# Patient Record
Sex: Female | Born: 1985 | Race: Black or African American | Hispanic: No | Marital: Single | Smoking: Never smoker
Health system: Southern US, Community
[De-identification: ages and names within clinical notes are randomized; demographics above are authoritative.]

## PROBLEM LIST (undated history)

## (undated) ENCOUNTER — Inpatient Hospital Stay (HOSPITAL_COMMUNITY): Payer: Self-pay

## (undated) DIAGNOSIS — R569 Unspecified convulsions: Secondary | ICD-10-CM

## (undated) HISTORY — PX: OTHER SURGICAL HISTORY: SHX169

## (undated) HISTORY — PX: NEPHRECTOMY: SHX65

---
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
## 2007-01-06 ENCOUNTER — Inpatient Hospital Stay (HOSPITAL_COMMUNITY): Admission: AD | Admit: 2007-01-06 | Discharge: 2007-01-07 | Payer: Self-pay | Admitting: Obstetrics & Gynecology

## 2007-04-30 ENCOUNTER — Inpatient Hospital Stay (HOSPITAL_COMMUNITY): Admission: AD | Admit: 2007-04-30 | Discharge: 2007-05-03 | Payer: Self-pay | Admitting: Pediatrics

## 2007-11-12 ENCOUNTER — Emergency Department (HOSPITAL_COMMUNITY): Admission: EM | Admit: 2007-11-12 | Discharge: 2007-11-12 | Payer: Self-pay | Admitting: Emergency Medicine

## 2009-03-27 ENCOUNTER — Emergency Department (HOSPITAL_COMMUNITY): Admission: EM | Admit: 2009-03-27 | Discharge: 2009-03-27 | Payer: Self-pay | Admitting: Emergency Medicine

## 2010-04-29 ENCOUNTER — Emergency Department (HOSPITAL_COMMUNITY): Admission: EM | Admit: 2010-04-29 | Discharge: 2010-04-29 | Payer: Self-pay | Admitting: Emergency Medicine

## 2010-07-26 ENCOUNTER — Emergency Department (HOSPITAL_COMMUNITY): Admission: EM | Admit: 2010-07-26 | Discharge: 2010-07-26 | Payer: Self-pay | Admitting: Emergency Medicine

## 2010-08-06 ENCOUNTER — Emergency Department (HOSPITAL_COMMUNITY)
Admission: EM | Admit: 2010-08-06 | Discharge: 2010-08-06 | Payer: Self-pay | Source: Home / Self Care | Admitting: Emergency Medicine

## 2010-08-12 ENCOUNTER — Emergency Department (HOSPITAL_COMMUNITY): Admission: EM | Admit: 2010-08-12 | Discharge: 2010-04-24 | Payer: Self-pay | Admitting: Emergency Medicine

## 2010-11-16 LAB — RAPID STREP SCREEN (MED CTR MEBANE ONLY): Streptococcus, Group A Screen (Direct): NEGATIVE

## 2010-11-18 LAB — URINALYSIS, ROUTINE W REFLEX MICROSCOPIC
Bilirubin Urine: NEGATIVE
Glucose, UA: NEGATIVE mg/dL
Ketones, ur: NEGATIVE mg/dL
Nitrite: NEGATIVE
Protein, ur: NEGATIVE mg/dL
Specific Gravity, Urine: 1.003 — ABNORMAL LOW (ref 1.005–1.030)
Specific Gravity, Urine: 1.009 (ref 1.005–1.030)
Urobilinogen, UA: 1 mg/dL (ref 0.0–1.0)
pH: 6 (ref 5.0–8.0)

## 2010-11-18 LAB — POCT I-STAT, CHEM 8
BUN: 3 mg/dL — ABNORMAL LOW (ref 6–23)
Calcium, Ion: 1.16 mmol/L (ref 1.12–1.32)
Chloride: 104 mEq/L (ref 96–112)
Creatinine, Ser: 0.8 mg/dL (ref 0.4–1.2)
Glucose, Bld: 110 mg/dL — ABNORMAL HIGH (ref 70–99)
HCT: 34 % — ABNORMAL LOW (ref 36.0–46.0)
Hemoglobin: 12.2 g/dL (ref 12.0–15.0)
Potassium: 2.9 mEq/L — ABNORMAL LOW (ref 3.5–5.1)
Sodium: 138 mEq/L (ref 135–145)
TCO2: 22 mmol/L (ref 0–100)
TCO2: 27 mmol/L (ref 0–100)

## 2010-11-18 LAB — URINE MICROSCOPIC-ADD ON

## 2010-11-18 LAB — URINE CULTURE: Culture  Setup Time: 201108201126

## 2010-11-18 LAB — POCT PREGNANCY, URINE: Preg Test, Ur: NEGATIVE

## 2011-01-18 NOTE — H&P (Signed)
NAME:  Olivia Kelly, Olivia Kelly NO.:  [id number]   MEDICAL RECORD NO.:  [id number]           PATIENT TYPE:   LOCATION:                               FACILITY:  [facility]   PHYSICIAN:  Deanna Artis. Hickling, M.D.DATE OF BIRTH:  Aug 08, 1986   DATE OF ADMISSION:  04/29/2007  DATE OF DISCHARGE:                              HISTORY & PHYSICAL   CHIEF COMPLAINT:  Intractable seizures.   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  The patient was admitted  for prolonged video telemetered EEG to further characterize the EEG and  the clinical semiology of her seizures.   PAST MEDICAL HISTORY:  The patient had onset of seizures at about 25  years of age.  She was diagnosed childhood absence  epilepsy and treated  with Zarontin with good success.  We were able to take her off of  medication.  She had been seizure-free for total of 8 years.  She had  not been seen for nearly 5 years when she returned with a generalized  tonic-clonic seizure disorder. For some reason she had been placed on  Tegretol  at one time and I cannot find a reason to substantiate that  decision.   December 2003 was when she had her first generalized tonic-clonic  seizure, which began with eye blinking that lasted for 2-3 minutes.  She  was seen in January 2004.  The decision was made not to place her  medication.  She did not have recurrent seizures until December 2004,  then another in July 2005 and then finally October 2005.  I saw her in  follow up on June 14, 2004.  The seizure preceding that visit caused  her to fall backwards after she froze up.  She had generalized tonic-  clonic jerking for 2-3 minutes.  She had loss of bowel and bladder  function, and was disoriented.  An EEG was performed.  She was placed on  Lamictal.  She returned in December 2006 with her father.   She did not  know how often she had seizures.  She stated that these happened about  once a month.  They  occurred at dances or at football games; she said  that the strobe lights induced them.  Her Lamictal dose was up to 100  twice daily.  Her drug levels were 4-5 mcg/mL.   The patient was not seen again until September 2007.  At that time she  had seizures in December 2006, and in April, May and August 2007.  During the last two she fell gashing her head and had loss of a urinary  continence.  During the last one the patient was noted to have the  lamotrigine level of 1.5.  She had several days of abdominal discomfort  and vomiting.  A urinary tract infection and positive pregnancy test  were discovered.  The events at that time were said to have lasted for  only 30 seconds with 30 minutes of postictal confusion.  The patient returned on December 07, 2006.  She had a flurry of three  seizures on March 08, 10 and 31, 2008 where she would stare for 2  minutes and have jerking back and forth of her body for 5 minutes  without falling, and she would then collapse and fall asleep.  Her  stupor lasted about 4 minutes and then she was postictal.  This occurred  at the end of her pregnancy.  On April 10, 2007 she returned as she had  generalized tonic-clonic seizures on Feb 01, 2007, June 08, 14 and 19,  2008, and July 17, 28 and 31, 2008; then she was with relatives in  [place].  Her lamotrigine dose had steadily increased and she was  taking 375 mg in the morning and 450 at nighttime.  She was quarrelsome,  irritable and cognitively blunted.  Topamax was started by one of my  partners and I increased it to 25 twice a day and dropped lamotrigine to  375 mg twice a day.   Throughout this time the patient's examination has not changed.  She has  always had some difficulty in school, but her cognitive dysfunction  seems to have worsen.   The patient has had migrainous headaches and most  often these occur  after seizures, but they are independent of them as well.  Her thinking  has been slow.  She has poor memory and slurred speech; this suggests  toxicity.  She has had jerking movements of her eyes.  I do not know if  this represents myoclonus.  She has had nausea and vomiting in  association with her headaches and idiopathic hives.   PAST MEDICAL HISTORY:  The past medical history is otherwise negative.   PAST SURGICAL HISTORY:  The past surgical history is negative.   MEDICATIONS:  Current medications include:  1. Lamictal was 375 mg twice daily.  2. Topamax 25 mg twice daily.  3. She is on some form of a B complex vitamin and also a multivitamin.   ALLERGIES:  Drug allergies; none known.   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  She wants to attend [employer].  She has been sexually active in  the past.  She has a 85-month-old daughter.  She is unemployed and unable  to drive.   FAMILY HISTORY:  The family history is positive for cardiovascular and  cerebrovascular diseases, hypertension, seizures, diabetes mellitus,  migraines, and lung cancer.   PHYSICAL EXAMINATION:  VITAL SIGNS:  On examination today height is 63-  3/4 inches, weight 132 pounds, blood pressure 92/62, resting pulse 84,  respirations 18, she is afebrile, and her oxygen saturation is not  measured.  HEENT AND NECK:  Ears, nose and throat are with no infections; and, neck  is without bruits, or meningismus.  LUNGS:  The lungs are clear.  HEART:  The heart has no murmurs and the pulses are normal.  ABDOMEN:  Soft.  Bowel sounds are normal.  No hepatosplenomegaly.  EXTREMITIES:  The extremities are well-formed without edema or cyanosis.  NEUROLOGIC:  On neurological examination she is awake, alert and  attentive, without dysphasia or dyspraxia.  Cranial nerves show round  and reactive pupils, normal fundi, visual fields are full, and  visual  acuity is 20/70  bilaterally without corrective lenses.  Symmetric facial  strength and sensation. Air conduction is greater than bone conduction  bilaterally.  She is able to protrude her tongue  and elevate her uvula  in the midline.  Motor examination reveals normal strength, tone and  mass.  Good fine motor movements.  No pronator drift.  Sensation is  intact to cold, vibrations, proprioception and stereognosis.  Cerebellar  examination reveals good finger-to-nose and rapid repetitive movements.  Gait is normal.  Normal heels, toes and tandem.  Deep tendon reflexes  are diminished.  The patient has bilateral flexor plantar responses.   IMPRESSION:  1. Intractable generalized tonic-clonic seizures.  345.11.  2. Past medical history of absence seizures.  345.00.   PLAN:  Video telemetered  EEG to discern the semiology and the EEG  characteristics of her seizures.  This will hopefully assess and assist  treatment.  I plan is to continue to push Topamax up for the and  decrease Lamictal.      Deanna Artis. Sharene Skeans, M.D.  Electronically Signed     [facility]/MED[facility]  D:  04/29/2007  T:  04/30/2007  Job:  [id number]

## 2011-01-18 NOTE — Discharge Summary (Signed)
NAME:  Olivia Kelly, Olivia Kelly NO.:  [id number]   MEDICAL RECORD NO.:  [id number]          PATIENT TYPE:  INP   LOCATION:  3023                         FACILITY:  [facility]   PHYSICIAN:  Deanna Artis. Hickling, M.D.DATE OF BIRTH:  1986/07/09   DATE OF ADMISSION:  04/30/2007  DATE OF DISCHARGE:  05/03/2007                               DISCHARGE SUMMARY   CLINICAL HISTORY:  The patient is an 25 year old with juvenile absence  epilepsy.  She has had persistent generalized tonic-clonic seizures that  have been present since 2003.  She had absence seizures since a toddler  and as a child and had been seizure-free for 8 years when her  generalized seizures recurred.  The history is detailed in her history  and physical examination. (345.10)   The patient was admitted for prolonged video telemetry EEG after a  series of outpatient EEGs routine and prolonged ambulatory failed to  show any evidence of seizures.   The patient has been in the hospital now since August 25 and no have  seizures occurred. No abnormalities have been seen on the video EEG.  For that reason, we will discontinue her prolonged EEG this morning and  discharge her from the hospital today.  She will be discharged on  Topamax 25 mg twice daily and lamotrigine at 150 mg 2-1/2twice daily.  The patient will see me in December 2008.  I will see her sooner  depending upon clinical need.  The remainder of the EEG rapport will be  a evaluated by my partner Dr. Imagene Gurney and he may dictate further  addendum based on his observations.  The patient is discharged in  improved condition.  She can return to school May 04, 2007.      Deanna Artis. Sharene Skeans, M.D.  Electronically Signed     [facility]  D:  05/03/2007  T:  05/04/2007  Job:  [id number]

## 2011-01-18 NOTE — Procedures (Signed)
EEG NUMBER:  [id number].   HISTORY:  This is an 25 year old with history of seizures who is having  prolonged video EEG monitoring to evaluate increased frequency of  seizures.   CURRENT MEDICATIONS:  Topamax and Lamictal.   PROCEDURE:  This is a prolonged video EEG monitoring.   TECHNICAL DESCRIPTION:  This prolonged video EEG monitoring occurred  from April 30, 2007, to May 03, 2007.  Day one of recording, there  are no push button events noted.  The background activity is fairly  symmetric, mostly comprised of alpha range activity at 25-40 microvolts.  The second half of the recording, there is intermittent 60 Hz artifact  noted.  There is also a tremendous amount of EMG movement artifact noted  as well.  There is no definitive epileptiform activity noticed on day  one recording.   Day two of recording, there are no push button events witnessed.  The  background activity is fairly symmetric, mostly comprised of alpha range  activity at 20-40 microvolts.  There is no evidence of an electrographic  seizure or interictal discharges noted throughout this recording day.   Day three of recording, no push button events were weakness.  The  background activity is fairly symmetric, mostly comprised of alpha range  activity at 20-45 microvolts.  There is an occasional 60 Hz artifact  noticed in the background.  There is no definitive epileptiform activity  noticed throughout this entire tracing.   IMPRESSION:  This prolonged video EEG monitoring from April 30, 2007,  to May 03, 2007, had no push button events witnessed.  There is no  definitive epileptiform activity noticed throughout this entire  recording.  These results were discussed with Dr. Sharene Skeans.      Bevelyn Buckles. Nash Shearer, M.D.  Electronically Signed     EAV:[id number]  D:  05/03/2007 12:06:37  T:  05/04/2007 09:30:54  Job #:  [id number]   cc:   Deanna Artis. Sharene Skeans, M.D.  Fax: [phone]

## 2011-01-21 NOTE — Procedures (Signed)
CLINICAL HISTORY:  The patient is an 25 year old with a history of  seizures that have been in good control until recently.  The patient has  had generalized events, one while awake, one while sleeping.  The study  is being done to look for the presence of a seizure disorder. (345.10)   PROCEDURE:  The tracing is carried out on a 32-channel digital Cadwell  recorder reformatted into 16 channel montages with one devoted to EKG.  The patient was awake during the recording.  The [employer] 10/20  system lead placement used.   MEDICATIONS:  Lamictal.   DESCRIPTION OF FINDINGS:  Dominant frequency is a 9 Hz, 20 microvolt  activity that is well regulated and attenuates partially with eye  opening.   Background activity is predominately alpha and upper theta range  activity was superimposed frontally predominant beta range components.   Rare sharply contoured slow wave activity was seen in the left posterior  temporoparietal region.  This did not occur frequently enough, nor did  it stand out from the background to be epileptogenic from an  electrographic viewpoint..   Photic stimulation failed to induce a definite driving response.  Hyperventilation also did not cause significant change.  There was no  focal slowing.  EKG showed regular sinus rhythm with ventricular  response of 96 beats per minute.   IMPRESSION:  Normal waking record.      Deanna Artis. Sharene Skeans, M.D.  Electronically Signed     ZOX:WRUE  D:  12/28/2006 14:01:22  T:  12/28/2006 15:39:47  Job #:  [id number]

## 2011-06-17 LAB — DIFFERENTIAL
Basophils Relative: 1
Eosinophils Absolute: 0.1
Eosinophils Relative: 3
Monocytes Relative: 13 — ABNORMAL HIGH
Neutro Abs: 1.6 — ABNORMAL LOW

## 2011-06-17 LAB — CBC
HCT: 40.6
MCV: 87.2
Platelets: 404 — ABNORMAL HIGH
RBC: 4.65
RDW: 12.6
WBC: 3.6 — ABNORMAL LOW

## 2011-06-17 LAB — ALT: ALT: 19

## 2011-06-17 LAB — LAMOTRIGINE LEVEL: Lamotrigine Lvl: 8.8 ug/mL (ref 4.0–18.0)

## 2011-11-15 ENCOUNTER — Emergency Department (HOSPITAL_COMMUNITY)
Admission: EM | Admit: 2011-11-15 | Discharge: 2011-11-15 | Disposition: A | Discharge status: Home / Self Care | Payer: Medicaid Other | Attending: Emergency Medicine | Admitting: Emergency Medicine

## 2011-11-15 ENCOUNTER — Encounter (HOSPITAL_COMMUNITY): Payer: Self-pay | Admitting: *Deleted

## 2011-11-15 DIAGNOSIS — G40909 Epilepsy, unspecified, not intractable, without status epilepticus: Secondary | ICD-10-CM | POA: Insufficient documentation

## 2011-11-15 DIAGNOSIS — R6889 Other general symptoms and signs: Secondary | ICD-10-CM | POA: Insufficient documentation

## 2011-11-15 DIAGNOSIS — H6690 Otitis media, unspecified, unspecified ear: Secondary | ICD-10-CM | POA: Insufficient documentation

## 2011-11-15 DIAGNOSIS — R070 Pain in throat: Secondary | ICD-10-CM | POA: Insufficient documentation

## 2011-11-15 DIAGNOSIS — R059 Cough, unspecified: Secondary | ICD-10-CM | POA: Insufficient documentation

## 2011-11-15 DIAGNOSIS — H9190 Unspecified hearing loss, unspecified ear: Secondary | ICD-10-CM | POA: Insufficient documentation

## 2011-11-15 DIAGNOSIS — R209 Unspecified disturbances of skin sensation: Secondary | ICD-10-CM | POA: Insufficient documentation

## 2011-11-15 DIAGNOSIS — R05 Cough: Secondary | ICD-10-CM | POA: Insufficient documentation

## 2011-11-15 DIAGNOSIS — H6691 Otitis media, unspecified, right ear: Secondary | ICD-10-CM

## 2011-11-15 DIAGNOSIS — H9209 Otalgia, unspecified ear: Secondary | ICD-10-CM | POA: Insufficient documentation

## 2011-11-15 HISTORY — DX: Unspecified convulsions: R56.9

## 2011-11-15 MED ORDER — AMOXICILLIN 500 MG PO CAPS: 500.0000 mg | ORAL_CAPSULE | Freq: Three times a day (TID) | ORAL | Status: AC

## 2011-11-15 NOTE — ED Notes (Signed)
D/C instructions reviewed, pt denies questions

## 2011-11-15 NOTE — ED Notes (Signed)
Pt in c/o earache and ear numbness x2 days, also sore throat, recent cold

## 2011-11-15 NOTE — Discharge Instructions (Signed)
Otitis Media, Adult  A middle ear infection is an infection in the space behind the eardrum. The medical name for this is "otitis media." It may happen after a common cold. It is caused by a germ that starts growing in that space. You may feel swollen glands in your neck on the side of the ear infection.  HOME CARE INSTRUCTIONS   · Take your medicine as directed until it is gone, even if you feel better after the first few days.  · Only take over-the-counter or prescription medicines for pain, discomfort, or fever as directed by your caregiver.  · Occasional use of a nasal decongestant a couple times per day may help with discomfort and help the eustachian tube to drain better.  Follow up with your caregiver in 10 to 14 days or as directed, to be certain that the infection has cleared. Not keeping the appointment could result in a chronic or permanent injury, pain, hearing loss and disability. If there is any problem keeping the appointment, you must call back to this facility for assistance.  SEEK IMMEDIATE MEDICAL CARE IF:   · You are not getting better in 2 to 3 days.  · You have pain that is not controlled with medication.  · You feel worse instead of better.  · You cannot use the medication as directed.  · You develop swelling, redness or pain around the ear or stiffness in your neck.  MAKE SURE YOU:   · Understand these instructions.  · Will watch your condition.  · Will get help right away if you are not doing well or get worse.  Document Released: 05/27/2004 Document Revised: 08/11/2011 Document Reviewed: 03/28/2008  Exit[employer]® Patient Information ©2012 [employer].

## 2011-11-15 NOTE — ED Provider Notes (Signed)
History     CSN: [id number]  Arrival date & time 11/15/11  2053   First MD Initiated Contact with Patient 11/15/11 2250      Chief Complaint  Patient presents with  . Otalgia    (Consider location/radiation/quality/duration/timing/severity/associated sxs/prior treatment) HPI  26 year old female presents to the ED with chief complaints of right earache. Patient states for the past several weeks she has been having cold symptoms. She has been complaining of sore throat, runny nose, sneezing and coughing, with earaches.  For the past 2 days she experienced in numbness to her right ear and decreased hearing.  She denies pain while laying on the right ear.  She denies fever, or chills. She denies chest pain or shortness of breath or neck pain.  She denies any recent swimming, or medication changes.  Past Medical History  Diagnosis Date  . Seizures     History reviewed. No pertinent past surgical history.  History reviewed. No pertinent family history.  History  Substance Use Topics  . Smoking status: Not on file  . Smokeless tobacco: Not on file  . Alcohol Use:     OB History    Grav Para Term Preterm Abortions TAB SAB Ect Mult Living                  Review of Systems  All other systems reviewed and are negative.    Allergies  Review of patient's allergies indicates no known allergies.  Home Medications   Current Outpatient Rx  Name Route Sig Dispense Refill  . IBUPROFEN 600 MG PO TABS Oral Take 600 mg by mouth every 6 (six) hours as needed. Pain    . TOPIRAMATE 50 MG PO TABS Oral Take 50 mg by mouth 2 (two) times daily.      BP 110/71  Pulse 98  Temp(Src) 98.3 F (36.8 C) (Oral)  Resp 16  Ht 5\' 3"  (1.6 m)  Wt 128 lb 6.4 oz (58.242 kg)  BMI 22.75 kg/m2  SpO2 100%  Physical Exam  Constitutional: She is oriented to person, place, and time. She appears well-developed and well-nourished. No distress.  HENT:  Head: Normocephalic and atraumatic.  Right  Ear: External ear and ear canal normal. No drainage, swelling or tenderness. No foreign bodies. Tympanic membrane is injected. Tympanic membrane is not perforated. A middle ear effusion is present. No hemotympanum. Decreased hearing is noted.  Left Ear: Hearing, tympanic membrane, external ear and ear canal normal.  Nose: Nose normal.  Mouth/Throat: Uvula is midline, oropharynx is clear and moist and mucous membranes are normal.  Eyes: Conjunctivae are normal.  Neck: Normal range of motion. Neck supple.  Cardiovascular: Normal rate and regular rhythm.  Exam reveals no gallop and no friction rub.   No murmur heard. Pulmonary/Chest: Effort normal. No respiratory distress. She has no wheezes.  Abdominal: Soft. Bowel sounds are normal. There is no tenderness.  Musculoskeletal: Normal range of motion.  Lymphadenopathy:    She has no cervical adenopathy.  Neurological: She is alert and oriented to person, place, and time.    ED Course  Procedures (including critical care time)  Labs Reviewed - No data to display No results found.   No diagnosis found.    MDM  Evidence of otitis media.  Will prescribe abx.  F/u with ENT were encourage if numbness persists.        Fayrene Helper, PA-C 11/15/11 2315

## 2011-11-16 NOTE — ED Provider Notes (Signed)
Medical screening examination/treatment/procedure(s) were performed by non-physician practitioner and as supervising physician I was immediately available for consultation/collaboration.  Cyndra Numbers, MD 11/16/11 [phone]

## 2012-01-08 ENCOUNTER — Emergency Department (HOSPITAL_COMMUNITY)
Admission: EM | Admit: 2012-01-08 | Discharge: 2012-01-09 | Disposition: A | Discharge status: Home / Self Care | Payer: Medicaid Other | Attending: Emergency Medicine | Admitting: Emergency Medicine

## 2012-01-08 ENCOUNTER — Encounter (HOSPITAL_COMMUNITY): Payer: Self-pay | Admitting: *Deleted

## 2012-01-08 DIAGNOSIS — R109 Unspecified abdominal pain: Secondary | ICD-10-CM | POA: Insufficient documentation

## 2012-01-08 DIAGNOSIS — M549 Dorsalgia, unspecified: Secondary | ICD-10-CM | POA: Insufficient documentation

## 2012-01-08 DIAGNOSIS — R51 Headache: Secondary | ICD-10-CM | POA: Insufficient documentation

## 2012-01-08 DIAGNOSIS — N390 Urinary tract infection, site not specified: Secondary | ICD-10-CM | POA: Insufficient documentation

## 2012-01-08 LAB — POCT I-STAT, CHEM 8
HCT: 38 % (ref 36.0–46.0)
Hemoglobin: 12.9 g/dL (ref 12.0–15.0)
Potassium: 3.8 mEq/L (ref 3.5–5.1)
Sodium: 141 mEq/L (ref 135–145)
TCO2: 24 mmol/L (ref 0–100)

## 2012-01-08 LAB — URINE MICROSCOPIC-ADD ON

## 2012-01-08 LAB — POCT PREGNANCY, URINE: Preg Test, Ur: NEGATIVE

## 2012-01-08 LAB — URINALYSIS, ROUTINE W REFLEX MICROSCOPIC
Nitrite: POSITIVE — AB
Specific Gravity, Urine: 1.019 (ref 1.005–1.030)
Urobilinogen, UA: 0.2 mg/dL (ref 0.0–1.0)
pH: 8 (ref 5.0–8.0)

## 2012-01-08 LAB — DIFFERENTIAL
Basophils Absolute: 0 10*3/uL (ref 0.0–0.1)
Basophils Relative: 0 % (ref 0–1)
Lymphocytes Relative: 43 % (ref 12–46)
Monocytes Absolute: 0.5 10*3/uL (ref 0.1–1.0)
Monocytes Relative: 9 % (ref 3–12)
Neutro Abs: 2.4 10*3/uL (ref 1.7–7.7)
Neutrophils Relative %: 45 % (ref 43–77)

## 2012-01-08 LAB — CBC
HCT: 36.4 % (ref 36.0–46.0)
Hemoglobin: 12.5 g/dL (ref 12.0–15.0)
MCHC: 34.3 g/dL (ref 30.0–36.0)
WBC: 5.4 10*3/uL (ref 4.0–10.5)

## 2012-01-08 NOTE — ED Notes (Addendum)
Patient ambulated to restroom to provide urine sample.  Patient instructed on how to provide clean catch urine sample.

## 2012-01-08 NOTE — ED Notes (Signed)
Dr. Zackowski at bedside  

## 2012-01-08 NOTE — ED Notes (Signed)
Lab at bedside

## 2012-01-08 NOTE — ED Notes (Signed)
Patient complaining of right and left abdominal pain that radiates to her right and left flank areas that started today.  Patient rates pain 3/10 on the numerical pain scale; describes pain as "sharp".  Patient states that she has had mirena (IUD) in for five years and that it is time to be taken out (patient's date to take out the mirena was this past Sunday, put patient states that she was unable to make an appointment on the weekend).  Patient denies nausea, vomiting, and diarrhea.  Patient does report headache earlier this evening.  Patient does report urinary frequency; denies other urinary and vaginal symptoms.  Patient states that while urinating today, she felt a "shocking pain" in her abdomen.  Patient does report history of UTIs.  Upon arrival to room, patient changed into gown.  Will continue to monitor.

## 2012-01-08 NOTE — ED Notes (Signed)
C/o abd & back pain, onset tonight around 1900, had a HA earlier, "time for merina to come out", also dysuria, (denies: nvd, fever, or other urinary sx or vaginal sx).

## 2012-01-09 MED ORDER — CEPHALEXIN 500 MG PO CAPS: 500.0000 mg | ORAL_CAPSULE | Freq: Four times a day (QID) | ORAL | Status: AC

## 2012-01-09 MED ORDER — CEPHALEXIN 250 MG PO CAPS
500.0000 mg | ORAL_CAPSULE | Freq: Once | ORAL | Status: AC
  Administered 2012-01-09: 500 mg via ORAL
  Filled 2012-01-09: qty 2

## 2012-01-09 MED ORDER — CEPHALEXIN 500 MG PO CAPS: 500.0000 mg | ORAL_CAPSULE | Freq: Four times a day (QID) | ORAL | Status: DC

## 2012-01-09 MED ORDER — NAPROXEN 500 MG PO TABS: 500.0000 mg | ORAL_TABLET | Freq: Two times a day (BID) | ORAL | Status: DC

## 2012-01-09 MED ORDER — IBUPROFEN 800 MG PO TABS
800.0000 mg | ORAL_TABLET | Freq: Once | ORAL | Status: AC
  Administered 2012-01-09: 800 mg via ORAL
  Filled 2012-01-09: qty 1

## 2012-01-09 NOTE — ED Provider Notes (Signed)
History     CSN: [id number]  Arrival date & time 01/08/12  2235   First MD Initiated Contact with Patient 01/08/12 2317      Chief Complaint  Patient presents with  . Abdominal Pain  . Back Pain    (Consider location/radiation/quality/duration/timing/severity/associated sxs/prior treatment) Patient is a 26 y.o. female presenting with abdominal pain. The history is provided by the patient and a parent.  Abdominal Pain The primary symptoms of the illness include abdominal pain. The primary symptoms of the illness do not include fever, shortness of breath, nausea, vomiting, dysuria, vaginal discharge or vaginal bleeding. The current episode started 6 to 12 hours ago. The problem has been gradually worsening.  The patient states that she believes she is currently not pregnant. The patient has not had a change in bowel habit. Symptoms associated with the illness do not include back pain.   Patient with onset of bilateral lower quadrant abdominal pain at 5:00 PM in the evening associated with mild headache pain is described as sharp and dull nonradiating no nausea no vomiting no fever no vaginal discharge or bleeding. Denies to sure he. Last menstrual period was 12/19/2011. Patient has an IUD in place. Past Medical History  Diagnosis Date  . Seizures     Past Surgical History  Procedure Date  . Cyst removal     Family History  Problem Relation Age of Onset  . Diabetes Mother   . Hypertension Mother   . Diabetes Father   . Cancer Other     History  Substance Use Topics  . Smoking status: Never Smoker   . Smokeless tobacco: Not on file  . Alcohol Use: No    OB History    Grav Para Term Preterm Abortions TAB SAB Ect Mult Living                  Review of Systems  Constitutional: Negative for fever.  HENT: Negative for neck pain.   Eyes: Negative for redness.  Respiratory: Negative for shortness of breath.   Gastrointestinal: Positive for abdominal pain. Negative for  nausea and vomiting.  Genitourinary: Negative for dysuria, vaginal bleeding and vaginal discharge.  Musculoskeletal: Negative for back pain.  Skin: Negative for rash.  Neurological: Positive for headaches.  Hematological: Does not bruise/bleed easily.    Allergies  Review of patient's allergies indicates no known allergies.  Home Medications   Current Outpatient Rx  Name Route Sig Dispense Refill  . IBUPROFEN 600 MG PO TABS Oral Take 600 mg by mouth every 6 (six) hours as needed. Pain    . TOPIRAMATE 100 MG PO TABS Oral Take 100 mg by mouth 2 (two) times daily.    . CEPHALEXIN 500 MG PO CAPS Oral Take 1 capsule (500 mg total) by mouth 4 (four) times daily. 28 capsule 0  . NAPROXEN 500 MG PO TABS Oral Take 1 tablet (500 mg total) by mouth 2 (two) times daily. 14 tablet 0    BP 115/70  Pulse 62  Temp(Src) 98.1 F (36.7 C) (Oral)  Resp 18  SpO2 100%  LMP 12/18/2011  Physical Exam  Nursing note and vitals reviewed. Constitutional: She is oriented to person, place, and time. She appears well-developed and well-nourished. No distress.  HENT:  Head: Normocephalic and atraumatic.  Mouth/Throat: Oropharynx is clear and moist.  Eyes: Conjunctivae are normal. Pupils are equal, round, and reactive to light.  Neck: Normal range of motion. Neck supple.  Cardiovascular: Normal rate, regular rhythm and  normal heart sounds.   No murmur heard. Pulmonary/Chest: Effort normal and breath sounds normal.  Abdominal: Soft. Bowel sounds are normal. There is no tenderness.  Musculoskeletal: Normal range of motion. She exhibits no edema.  Neurological: She is alert and oriented to person, place, and time. No cranial nerve deficit. She exhibits normal muscle tone. Coordination normal.  Skin: No rash noted.    ED Course  Procedures (including critical care time)  Labs Reviewed  URINALYSIS, ROUTINE W REFLEX MICROSCOPIC - Abnormal; Notable for the following:    APPearance CLOUDY (*)    Nitrite  POSITIVE (*)    Leukocytes, UA LARGE (*)    All other components within normal limits  URINE MICROSCOPIC-ADD ON - Abnormal; Notable for the following:    Squamous Epithelial / LPF FEW (*)    Bacteria, UA MANY (*)    All other components within normal limits  CBC  DIFFERENTIAL  POCT PREGNANCY, URINE  POCT I-STAT, CHEM 8   No results found. Results for orders placed during the hospital encounter of 01/08/12  CBC      Component Value Range   WBC 5.4  4.0 - 10.5 (K/uL)   RBC 4.35  3.87 - 5.11 (MIL/uL)   Hemoglobin 12.5  12.0 - 15.0 (g/dL)   HCT 10.2  72.5 - 36.6 (%)   MCV 83.7  78.0 - 100.0 (fL)   MCH 28.7  26.0 - 34.0 (pg)   MCHC 34.3  30.0 - 36.0 (g/dL)   RDW 44.0  34.7 - 42.5 (%)   Platelets 326  150 - 400 (K/uL)  DIFFERENTIAL      Component Value Range   Neutrophils Relative 45  43 - 77 (%)   Neutro Abs 2.4  1.7 - 7.7 (K/uL)   Lymphocytes Relative 43  12 - 46 (%)   Lymphs Abs 2.3  0.7 - 4.0 (K/uL)   Monocytes Relative 9  3 - 12 (%)   Monocytes Absolute 0.5  0.1 - 1.0 (K/uL)   Eosinophils Relative 2  0 - 5 (%)   Eosinophils Absolute 0.1  0.0 - 0.7 (K/uL)   Basophils Relative 0  0 - 1 (%)   Basophils Absolute 0.0  0.0 - 0.1 (K/uL)  URINALYSIS, ROUTINE W REFLEX MICROSCOPIC      Component Value Range   Color, Urine YELLOW  YELLOW    APPearance CLOUDY (*) CLEAR    Specific Gravity, Urine 1.019  1.005 - 1.030    pH 8.0  5.0 - 8.0    Glucose, UA NEGATIVE  NEGATIVE (mg/dL)   Hgb urine dipstick NEGATIVE  NEGATIVE    Bilirubin Urine NEGATIVE  NEGATIVE    Ketones, ur NEGATIVE  NEGATIVE (mg/dL)   Protein, ur NEGATIVE  NEGATIVE (mg/dL)   Urobilinogen, UA 0.2  0.0 - 1.0 (mg/dL)   Nitrite POSITIVE (*) NEGATIVE    Leukocytes, UA LARGE (*) NEGATIVE   POCT PREGNANCY, URINE      Component Value Range   Preg Test, Ur NEGATIVE  NEGATIVE   POCT I-STAT, CHEM 8      Component Value Range   Sodium 141  135 - 145 (mEq/L)   Potassium 3.8  3.5 - 5.1 (mEq/L)   Chloride 106  96 - 112  (mEq/L)   BUN 15  6 - 23 (mg/dL)   Creatinine, Ser 9.56  0.50 - 1.10 (mg/dL)   Glucose, Bld 85  70 - 99 (mg/dL)   Calcium, Ion 3.87  5.64 - 1.32 (mmol/L)  TCO2 24  0 - 100 (mmol/L)   Hemoglobin 12.9  12.0 - 15.0 (g/dL)   HCT 16.1  09.6 - 04.5 (%)  URINE MICROSCOPIC-ADD ON      Component Value Range   Squamous Epithelial / LPF FEW (*) RARE    WBC, UA TOO NUMEROUS TO COUNT  <3 (WBC/hpf)   Bacteria, UA MANY (*) RARE      1. UTI (lower urinary tract infection)       MDM   Labs consistent with urinary tract infection without complication. No leukocytosis no fever no nausea no vomiting. Patient's abdomen was soft nontender no vaginal discharge. Will treat with Keflex first dose he or continued at home should be better in 2 days patient will followup if not improved. We'll send urine for culture.      Shelda Jakes, MD 01/09/12 [phone]

## 2012-01-09 NOTE — ED Notes (Signed)
Patient currently sitting up in bed; no respiratory or acute distress noted.  Patient updated on plan of care; informed patient that lab results are back and that we are currently waiting on the EDP to come and talk about results.  Patient has no other questions or concerns at this time.  Family present at bedside.  Will continue to monitor.

## 2012-01-09 NOTE — Discharge Instructions (Signed)
Urinary Tract Infection A urinary tract infection (UTI) is often caused by a germ (bacteria). A UTI is usually helped with medicine (antibiotics) that kills germs. Take all the medicine until it is gone. Do this even if you are feeling better. You are usually better in 7 to 10 days. HOME CARE   Drink enough water and fluids to keep your pee (urine) clear or pale yellow. Drink:   Cranberry juice.   Water.   Avoid:   Caffeine.   Tea.   Bubbly (carbonated) drinks.   Alcohol.   Only take medicine as told by your doctor.   To prevent further infections:   Pee often.   After pooping (bowel movement), women should wipe from front to back. Use each tissue only once.   Pee before and after having sex (intercourse).  Ask your doctor when your test results will be ready. Make sure you follow up and get your test results.  GET HELP RIGHT AWAY IF:   There is very bad back pain or lower belly (abdominal) pain.   You get the chills.   You have a fever.   Your baby is older than 3 months with a rectal temperature of 102 F (38.9 C) or higher.   Your baby is 50 months old or younger with a rectal temperature of 100.4 F (38 C) or higher.   You feel sick to your stomach (nauseous) or throw up (vomit).   There is continued burning with peeing.   Your problems are not better in 3 days. Return sooner if you are getting worse.  MAKE SURE YOU:   Understand these instructions.   Will watch your condition.   Will get help right away if you are not doing well or get worse.  Document Released: 02/08/2008 Document Revised: 08/11/2011 Document Reviewed: 02/08/2008 [facility] Patient Information 2012 [place], [place].  Take antibiotic as directed. The abdominal pain and discomfort should be improved in 2 days if not she needs to return. Return earlier for fever persistent vomiting. Take Naprosyn as needed for bowel discomfort.

## 2012-01-09 NOTE — ED Notes (Signed)
Patient currently sitting up in bed; no respiratory or acute distress noted.  Patient updated on plan of care; informed patient that we are currently waiting on further orders from EDP.  Patient has no other questions/concerns at this time.  Family present at bedside.  Will continue to monitor.

## 2012-01-10 LAB — URINE CULTURE

## 2012-01-11 NOTE — ED Notes (Signed)
+   urine Patient treated with Keflex-sensitive to same-chart appended per protocol MD. 

## 2012-08-20 ENCOUNTER — Emergency Department (HOSPITAL_COMMUNITY)
Admission: EM | Admit: 2012-08-20 | Discharge: 2012-08-20 | Disposition: A | Discharge status: Home / Self Care | Payer: Medicaid Other | Source: Home / Self Care

## 2012-08-20 ENCOUNTER — Emergency Department (INDEPENDENT_AMBULATORY_CARE_PROVIDER_SITE_OTHER): Payer: Medicaid Other

## 2012-08-20 ENCOUNTER — Encounter (HOSPITAL_COMMUNITY): Payer: Self-pay | Admitting: Emergency Medicine

## 2012-08-20 DIAGNOSIS — S93409A Sprain of unspecified ligament of unspecified ankle, initial encounter: Secondary | ICD-10-CM

## 2012-08-20 NOTE — ED Provider Notes (Signed)
History     CSN: [id number]  Arrival date & time 08/20/12  1113   None     Chief Complaint  Patient presents with  . Ankle Pain    (Consider location/radiation/quality/duration/timing/severity/associated sxs/prior treatment) HPI Comments: 26 year old female who was descending steps 2 days ago turned her left ankle. Since the injury she has been at work standing for (2) 8 hour shifts. During this time she is developed tingling and increased discomfort in the ankle with radiation to the foot. She denies blunt trauma and she denies injury to the knee hip or lower leg.   Past Medical History  Diagnosis Date  . Seizures     Past Surgical History  Procedure Date  . Cyst removal     Family History  Problem Relation Age of Onset  . Diabetes Mother   . Hypertension Mother   . Diabetes Father   . Cancer Other     History  Substance Use Topics  . Smoking status: Never Smoker   . Smokeless tobacco: Not on file  . Alcohol Use: Yes    OB History    Grav Para Term Preterm Abortions TAB SAB Ect Mult Living                  Review of Systems  Constitutional: Negative for fever, chills and activity change.  HENT: Negative.   Respiratory: Negative.   Cardiovascular: Negative.   Musculoskeletal:       As per HPI  Skin: Negative for color change, pallor and rash.  Neurological: Negative.     Allergies  Review of patient's allergies indicates no known allergies.  Home Medications   Current Outpatient Rx  Name  Route  Sig  Dispense  Refill  . IBUPROFEN 600 MG PO TABS   Oral   Take 600 mg by mouth every 6 (six) hours as needed. Pain         . NAPROXEN 500 MG PO TABS   Oral   Take 1 tablet (500 mg total) by mouth 2 (two) times daily.   14 tablet   0   . TOPIRAMATE 100 MG PO TABS   Oral   Take 100 mg by mouth 2 (two) times daily.           BP 138/90  Pulse 84  Temp 99.3 F (37.4 C) (Oral)  Resp 18  SpO2 100%  LMP 08/09/2012  Physical Exam   Nursing note and vitals reviewed. Constitutional: She is oriented to person, place, and time. She appears well-developed and well-nourished. No distress.  HENT:  Head: Normocephalic and atraumatic.  Eyes: EOM are normal. Pupils are equal, round, and reactive to light.  Neck: Normal range of motion. Neck supple.  Musculoskeletal:       The left ankle and foot has a normal architecture of parents. No deformity or appreciable swelling. There is no tenderness in the bones of the foot or toes. Equivocal tenderness over the left malleolus. There is tenderness to the soft tissues just superior to the lateral malleolus as well as the anterior ankle. Range of motion is normal. Distal neurovascular motor sensory is intact.  Neurological: She is alert and oriented to person, place, and time. No cranial nerve deficit. She exhibits normal muscle tone.  Skin: Skin is warm and dry.  Psychiatric: She has a normal mood and affect.    ED Course  Procedures (including critical care time)  Labs Reviewed - No data to display Dg Ankle Complete Left  08/20/2012  *RADIOLOGY REPORT*  Clinical Data: Injured left ankle.  LEFT ANKLE COMPLETE - 3+ VIEW  Comparison: None  Findings: The ankle mortise is maintained.  No acute fracture or osteochondral abnormality.  The visualized mid and hind foot bony structures are intact.  IMPRESSION: No acute bony findings.   Original Report Authenticated By: Rudie Meyer, M.D.      1. Mild ankle sprain       MDM  Rest, ice, elevation, Ace wrap to the ankle or anywhere while up and about. Recommend limit prolonged standing and walking. Dg Ankle Complete Left  08/20/2012  *RADIOLOGY REPORT*  Clinical Data: Injured left ankle.  LEFT ANKLE COMPLETE - 3+ VIEW  Comparison: None  Findings: The ankle mortise is maintained.  No acute fracture or osteochondral abnormality.  The visualized mid and hind foot bony structures are intact.  IMPRESSION: No acute bony findings.   Original  Report Authenticated By: Rudie Meyer, M.D.            Hayden Rasmussen, NP 08/20/12 1347  Hayden Rasmussen, NP 08/20/12 1350

## 2012-08-20 NOTE — ED Notes (Signed)
Reports she was running down steps, left ankle rolled to the side, but she continued running down steps to keep from falling.

## 2012-08-20 NOTE — ED Provider Notes (Signed)
Medical screening examination/treatment/procedure(s) were performed by resident physician or non-physician practitioner and as supervising physician I was immediately available for consultation/collaboration.   Barkley Bruns MD.    Linna Hoff, MD 08/20/12 [phone]

## 2012-08-26 ENCOUNTER — Encounter (HOSPITAL_COMMUNITY): Payer: Self-pay | Admitting: *Deleted

## 2012-08-26 ENCOUNTER — Emergency Department (HOSPITAL_COMMUNITY)
Admission: EM | Admit: 2012-08-26 | Discharge: 2012-08-27 | Disposition: A | Discharge status: Home / Self Care | Payer: Medicaid Other | Attending: Emergency Medicine | Admitting: Emergency Medicine

## 2012-08-26 DIAGNOSIS — S93409A Sprain of unspecified ligament of unspecified ankle, initial encounter: Secondary | ICD-10-CM | POA: Insufficient documentation

## 2012-08-26 DIAGNOSIS — R269 Unspecified abnormalities of gait and mobility: Secondary | ICD-10-CM | POA: Insufficient documentation

## 2012-08-26 DIAGNOSIS — X58XXXA Exposure to other specified factors, initial encounter: Secondary | ICD-10-CM | POA: Insufficient documentation

## 2012-08-26 DIAGNOSIS — G40909 Epilepsy, unspecified, not intractable, without status epilepticus: Secondary | ICD-10-CM | POA: Insufficient documentation

## 2012-08-26 DIAGNOSIS — Y929 Unspecified place or not applicable: Secondary | ICD-10-CM | POA: Insufficient documentation

## 2012-08-26 DIAGNOSIS — Y939 Activity, unspecified: Secondary | ICD-10-CM | POA: Insufficient documentation

## 2012-08-26 DIAGNOSIS — Z79899 Other long term (current) drug therapy: Secondary | ICD-10-CM | POA: Insufficient documentation

## 2012-08-26 NOTE — ED Notes (Addendum)
Injured ankle on 12/14, gradually progressively worse. Seen at [facility] previously for the same, xrays done. Needs re-check for work tomorrow. C/o L ankle pain and swelling.

## 2012-08-26 NOTE — ED Provider Notes (Signed)
Medical screening examination/treatment/procedure(s) were performed by non-physician practitioner and as supervising physician I was immediately available for consultation/collaboration.   Loren Racer, MD 08/26/12 [phone]

## 2012-08-26 NOTE — ED Provider Notes (Signed)
History     CSN: [id number]  Arrival date & time 08/26/12  2216   First MD Initiated Contact with Patient 08/26/12 2246      Chief Complaint  Patient presents with  . Ankle Pain    (Consider location/radiation/quality/duration/timing/severity/associated sxs/prior treatment) HPI Comments: Patient sprained her ankle 2, weeks, ago.  She's been wearing an ASO for support.  She needs a work to clear.  Her to go back to work without restriction.  She states the pain in her ankle is better.  The swelling has decreased.  She is ambulating with little pain, although at the end of the, day.  She is limping slightly  Patient is a 26 y.o. female presenting with ankle pain.  Ankle Pain  Pertinent negatives include no numbness.    Past Medical History  Diagnosis Date  . Seizures     Past Surgical History  Procedure Date  . Cyst removal     Family History  Problem Relation Age of Onset  . Diabetes Mother   . Hypertension Mother   . Diabetes Father   . Cancer Other     History  Substance Use Topics  . Smoking status: Never Smoker   . Smokeless tobacco: Not on file  . Alcohol Use: Yes    OB History    Grav Para Term Preterm Abortions TAB SAB Ect Mult Living                  Review of Systems  Constitutional: Negative for fever and chills.  HENT: Negative.   Respiratory: Negative.   Cardiovascular: Negative.   Genitourinary: Negative.   Musculoskeletal: Positive for gait problem. Negative for joint swelling.  Neurological: Negative for dizziness, weakness and numbness.  All other systems reviewed and are negative.    Allergies  Review of patient's allergies indicates no known allergies.  Home Medications   Current Outpatient Rx  Name  Route  Sig  Dispense  Refill  . LEVONORGESTREL 20 MCG/24HR IU IUD   Intrauterine   1 each by Intrauterine route once.         . TOPIRAMATE 100 MG PO TABS   Oral   Take 100 mg by mouth 2 (two) times daily.           BP  118/61  Pulse 101  Temp 98.7 F (37.1 C) (Oral)  Resp 16  SpO2 100%  LMP 08/09/2012  Physical Exam  Constitutional: She is oriented to person, place, and time. She appears well-nourished.  Neck: Normal range of motion.  Pulmonary/Chest: Effort normal.  Musculoskeletal: She exhibits tenderness. She exhibits no edema.       Minimal tenderness in the lateral malleolus  Neurological: She is alert and oriented to person, place, and time.  Skin: Skin is warm.    ED Course  Procedures (including critical care time)  Labs Reviewed - No data to display No results found.   1. Ankle sprain       MDM  Allow patient to return to work without restriction         Arman Filter, NP 08/26/12 2307

## 2012-08-27 NOTE — ED Notes (Signed)
Pt here for note to clear her to return to work.

## 2012-11-01 ENCOUNTER — Emergency Department (HOSPITAL_COMMUNITY)
Admission: EM | Admit: 2012-11-01 | Discharge: 2012-11-02 | Disposition: A | Discharge status: Home / Self Care | Payer: Medicaid Other | Attending: Emergency Medicine | Admitting: Emergency Medicine

## 2012-11-01 ENCOUNTER — Encounter (HOSPITAL_COMMUNITY): Payer: Self-pay | Admitting: Emergency Medicine

## 2012-11-01 DIAGNOSIS — Z79899 Other long term (current) drug therapy: Secondary | ICD-10-CM | POA: Insufficient documentation

## 2012-11-01 DIAGNOSIS — Z3202 Encounter for pregnancy test, result negative: Secondary | ICD-10-CM | POA: Insufficient documentation

## 2012-11-01 DIAGNOSIS — R197 Diarrhea, unspecified: Secondary | ICD-10-CM | POA: Insufficient documentation

## 2012-11-01 DIAGNOSIS — R42 Dizziness and giddiness: Secondary | ICD-10-CM | POA: Insufficient documentation

## 2012-11-01 DIAGNOSIS — N390 Urinary tract infection, site not specified: Secondary | ICD-10-CM | POA: Insufficient documentation

## 2012-11-01 DIAGNOSIS — R1110 Vomiting, unspecified: Secondary | ICD-10-CM | POA: Insufficient documentation

## 2012-11-01 DIAGNOSIS — G40909 Epilepsy, unspecified, not intractable, without status epilepticus: Secondary | ICD-10-CM | POA: Insufficient documentation

## 2012-11-01 LAB — COMPREHENSIVE METABOLIC PANEL
Albumin: 3.9 g/dL (ref 3.5–5.2)
Alkaline Phosphatase: 54 U/L (ref 39–117)
BUN: 8 mg/dL (ref 6–23)
CO2: 24 mEq/L (ref 19–32)
Chloride: 100 mEq/L (ref 96–112)
GFR calc non Af Amer: 84 mL/min — ABNORMAL LOW (ref >90)
Potassium: 3 mEq/L — ABNORMAL LOW (ref 3.5–5.1)
Total Bilirubin: 0.3 mg/dL (ref 0.3–1.2)

## 2012-11-01 LAB — CBC WITH DIFFERENTIAL/PLATELET
Basophils Relative: 0 % (ref 0–1)
HCT: 41.2 % (ref 36.0–46.0)
Hemoglobin: 14.8 g/dL (ref 12.0–15.0)
Lymphocytes Relative: 20 % (ref 12–46)
Lymphs Abs: 0.9 10*3/uL (ref 0.7–4.0)
MCHC: 35.9 g/dL (ref 30.0–36.0)
Monocytes Relative: 10 % (ref 3–12)
Neutro Abs: 3.2 10*3/uL (ref 1.7–7.7)
Neutrophils Relative %: 70 % (ref 43–77)
RBC: 4.93 MIL/uL (ref 3.87–5.11)
WBC: 4.6 10*3/uL (ref 4.0–10.5)

## 2012-11-01 MED ORDER — ONDANSETRON HCL 4 MG/2ML IJ SOLN
4.0000 mg | Freq: Once | INTRAMUSCULAR | Status: AC
  Administered 2012-11-01: 4 mg via INTRAVENOUS
  Filled 2012-11-01: qty 2

## 2012-11-01 MED ORDER — POTASSIUM CHLORIDE CRYS ER 20 MEQ PO TBCR
40.0000 meq | EXTENDED_RELEASE_TABLET | Freq: Once | ORAL | Status: AC
  Administered 2012-11-02: 40 meq via ORAL
  Filled 2012-11-01: qty 2

## 2012-11-01 MED ORDER — SODIUM CHLORIDE 0.9 % IV BOLUS (SEPSIS)
1000.0000 mL | Freq: Once | INTRAVENOUS | Status: AC
  Administered 2012-11-01: 1000 mL via INTRAVENOUS

## 2012-11-01 NOTE — ED Provider Notes (Signed)
History  This chart was scribed for Loren Racer, MD by Bennett Scrape, ED Scribe. This patient was seen in room D30C/D30C and the patient's care was started at 11:00 PM.  CSN: [id number]  Arrival date & time 11/01/12  2244   First MD Initiated Contact with Patient 11/01/12 2300      Chief Complaint  Patient presents with  . Diarrhea    Patient is a 27 y.o. female presenting with diarrhea. The history is provided by the patient. No language interpreter was used.  Diarrhea Duration:  2 days Associated symptoms: no chills, no fever and no vomiting   Risk factors: no sick contacts     Olivia Kelly is a 27 y.o. female who presents to the Emergency Department complaining of 2 days of 4 episodes of diarrhea daily with one associated episode of emesis today and episodic abdominal cramping. She reports lightheadedness while walking. She denies having any sick contacts with similar symptoms or recent out of country travel. She denies fevers, chills, melena, hematochezia and vaginal symptoms as associated symptoms. She has a h/o seizures and is an occasional alcohol user but denies smoking.   Past Medical History  Diagnosis Date  . Seizures     Past Surgical History  Procedure Laterality Date  . Cyst removal      Family History  Problem Relation Age of Onset  . Diabetes Mother   . Hypertension Mother   . Diabetes Father   . Cancer Other     History  Substance Use Topics  . Smoking status: Never Smoker   . Smokeless tobacco: Not on file  . Alcohol Use: Yes    No OB history provided.  Review of Systems  Constitutional: Negative for fever and chills.  Gastrointestinal: Positive for diarrhea. Negative for nausea and vomiting.  Genitourinary: Negative for vaginal bleeding and vaginal discharge.  All other systems reviewed and are negative.    Allergies  Review of patient's allergies indicates no known allergies.  Home Medications   Current Outpatient Rx   Name  Route  Sig  Dispense  Refill  . levonorgestrel (MIRENA) 20 MCG/24HR IUD   Intrauterine   1 each by Intrauterine route once.         . loperamide (IMODIUM A-D) 2 MG tablet   Oral   Take 2 mg by mouth 4 (four) times daily as needed for diarrhea or loose stools.         . topiramate (TOPAMAX) 100 MG tablet   Oral   Take 100 mg by mouth 2 (two) times daily.           Triage Vitals: BP 112/77  Pulse 110  Temp(Src) 98.2 F (36.8 C) (Oral)  Resp 20  SpO2 100%  LMP 10/12/2012  Physical Exam  Nursing note and vitals reviewed. Constitutional: She is oriented to person, place, and time. She appears well-developed and well-nourished. No distress.  HENT:  Head: Normocephalic and atraumatic.  Eyes: Conjunctivae and EOM are normal.  Neck: Neck supple. No tracheal deviation present.  Cardiovascular: Normal rate and regular rhythm.   Pulmonary/Chest: Effort normal and breath sounds normal. No respiratory distress.  Abdominal: Soft. There is no tenderness.  Hyperactive bowel sounds  Musculoskeletal: Normal range of motion.  Neurological: She is alert and oriented to person, place, and time.  Skin: Skin is warm and dry.  Psychiatric: She has a normal mood and affect. Her behavior is normal.    ED Course  Procedures (including critical care  time)  DIAGNOSTIC STUDIES: Oxygen Saturation is 100% on room air, normal by my interpretation.    COORDINATION OF CARE: 11:10 PM-Discussed treatment plan which includes antiemetic, IV fluids, CBC panel, and UA with pt at bedside and pt agreed to plan.   11:15 PM- Ordered 1,000 mL of bolus, 4 mg Zofran injection and potassium chloride 40 mEq  1:30 AM -Informed pt of lab work results. Discussed discharge plan which includes follow up with GI as scheduled tomorrow with pt and pt agreed to plan. Also advised pt to return for worsening of symptoms or concerning symptoms.  Labs Reviewed  COMPREHENSIVE METABOLIC PANEL - Abnormal; Notable  for the following:    Sodium 134 (*)    Potassium 3.0 (*)    Glucose, Bld 119 (*)    GFR calc non Af Amer 84 (*)    All other components within normal limits  URINALYSIS, ROUTINE W REFLEX MICROSCOPIC - Abnormal; Notable for the following:    APPearance CLOUDY (*)    Hgb urine dipstick MODERATE (*)    Nitrite POSITIVE (*)    Leukocytes, UA LARGE (*)    All other components within normal limits  URINE MICROSCOPIC-ADD ON - Abnormal; Notable for the following:    Bacteria, UA MANY (*)    All other components within normal limits  URINE CULTURE  CBC WITH DIFFERENTIAL  POCT PREGNANCY, URINE   No results found.   No diagnosis found.    MDM  I personally performed the services described in this documentation, which was scribed in my presence. The recorded information has been reviewed and is accurate.    Loren Racer, MD 11/02/12 [phone]

## 2012-11-01 NOTE — ED Notes (Signed)
PT. REPORTS DIARRHEA ONSET LAST Tuesday AND EMESIS TODAY WITH HEADACHE AND ABDOMINAL CRAMPS.

## 2012-11-02 LAB — URINE MICROSCOPIC-ADD ON

## 2012-11-02 LAB — URINALYSIS, ROUTINE W REFLEX MICROSCOPIC
Glucose, UA: NEGATIVE mg/dL
Ketones, ur: NEGATIVE mg/dL
Protein, ur: NEGATIVE mg/dL

## 2012-11-02 MED ORDER — DEXTROSE 5 % IV SOLN
1.0000 g | Freq: Once | INTRAVENOUS | Status: AC
  Administered 2012-11-02: 1 g via INTRAVENOUS
  Filled 2012-11-02: qty 10

## 2012-11-03 LAB — URINE CULTURE

## 2012-11-04 ENCOUNTER — Telehealth (HOSPITAL_COMMUNITY): Payer: Self-pay | Admitting: Emergency Medicine

## 2012-11-04 NOTE — ED Notes (Signed)
+   Urine Chart sent to EDP office for review. 

## 2012-11-04 NOTE — ED Notes (Signed)
Patient has +Urine culture. Checking to see if appropriately treated. °

## 2012-11-06 NOTE — ED Notes (Addendum)
Patient informed of positive results after id'd x 2  Wants rx called to [facility] @ [phone]

## 2012-11-06 NOTE — ED Notes (Signed)
Chart returned from EDP office.Start Kelfex 500 mg QID X 7 days  Written by Roxy Horseman.

## 2013-02-20 ENCOUNTER — Emergency Department (HOSPITAL_COMMUNITY)
Admission: EM | Admit: 2013-02-20 | Discharge: 2013-02-20 | Disposition: A | Discharge status: Home / Self Care | Payer: No Typology Code available for payment source | Attending: Emergency Medicine | Admitting: Emergency Medicine

## 2013-02-20 ENCOUNTER — Encounter (HOSPITAL_COMMUNITY): Payer: Self-pay

## 2013-02-20 DIAGNOSIS — G40909 Epilepsy, unspecified, not intractable, without status epilepticus: Secondary | ICD-10-CM | POA: Insufficient documentation

## 2013-02-20 DIAGNOSIS — Y9389 Activity, other specified: Secondary | ICD-10-CM | POA: Insufficient documentation

## 2013-02-20 DIAGNOSIS — Y92410 Unspecified street and highway as the place of occurrence of the external cause: Secondary | ICD-10-CM | POA: Insufficient documentation

## 2013-02-20 DIAGNOSIS — Z79899 Other long term (current) drug therapy: Secondary | ICD-10-CM | POA: Insufficient documentation

## 2013-02-20 DIAGNOSIS — IMO0002 Reserved for concepts with insufficient information to code with codable children: Secondary | ICD-10-CM | POA: Insufficient documentation

## 2013-02-20 MED ORDER — HYDROCODONE-ACETAMINOPHEN 5-325 MG PO TABS: 1.0000 | ORAL_TABLET | ORAL | Status: DC | PRN

## 2013-02-20 MED ORDER — METHOCARBAMOL 500 MG PO TABS: 500.0000 mg | ORAL_TABLET | Freq: Two times a day (BID) | ORAL | Status: DC | PRN

## 2013-02-20 MED ORDER — NAPROXEN 500 MG PO TABS: 500.0000 mg | ORAL_TABLET | Freq: Two times a day (BID) | ORAL | Status: DC

## 2013-02-20 NOTE — ED Provider Notes (Signed)
Medical screening examination/treatment/procedure(s) were performed by non-physician practitioner and as supervising physician I was immediately available for consultation/collaboration.    Vida Roller, MD 02/20/13 [phone]
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
## 2013-04-04 DIAGNOSIS — R569 Unspecified convulsions: Secondary | ICD-10-CM

## 2013-05-21 ENCOUNTER — Ambulatory Visit: Payer: No Typology Code available for payment source | Admitting: Physical Therapy

## 2013-06-03 ENCOUNTER — Ambulatory Visit: Payer: No Typology Code available for payment source | Admitting: Physical Therapy

## 2013-06-03 ENCOUNTER — Ambulatory Visit: Payer: Medicaid Other | Attending: Orthopedic Surgery

## 2013-06-03 DIAGNOSIS — M545 Low back pain, unspecified: Secondary | ICD-10-CM | POA: Insufficient documentation

## 2013-06-03 DIAGNOSIS — IMO0001 Reserved for inherently not codable concepts without codable children: Secondary | ICD-10-CM | POA: Diagnosis not present

## 2013-06-03 DIAGNOSIS — R293 Abnormal posture: Secondary | ICD-10-CM | POA: Diagnosis not present

## 2013-06-06 ENCOUNTER — Ambulatory Visit: Payer: Medicaid Other | Attending: Orthopedic Surgery | Admitting: Physical Therapy

## 2013-06-06 DIAGNOSIS — R293 Abnormal posture: Secondary | ICD-10-CM | POA: Insufficient documentation

## 2013-06-06 DIAGNOSIS — M545 Low back pain, unspecified: Secondary | ICD-10-CM | POA: Insufficient documentation

## 2013-06-06 DIAGNOSIS — IMO0001 Reserved for inherently not codable concepts without codable children: Secondary | ICD-10-CM | POA: Insufficient documentation

## 2013-06-11 ENCOUNTER — Ambulatory Visit: Payer: Medicaid Other | Admitting: Rehabilitation

## 2013-06-11 DIAGNOSIS — IMO0001 Reserved for inherently not codable concepts without codable children: Secondary | ICD-10-CM | POA: Diagnosis not present

## 2013-06-13 ENCOUNTER — Encounter: Payer: Medicaid Other | Admitting: Rehabilitation

## 2013-06-18 ENCOUNTER — Ambulatory Visit: Payer: Medicaid Other | Admitting: Rehabilitation

## 2013-06-18 DIAGNOSIS — IMO0001 Reserved for inherently not codable concepts without codable children: Secondary | ICD-10-CM | POA: Diagnosis not present

## 2013-06-24 ENCOUNTER — Ambulatory Visit: Payer: Medicaid Other

## 2013-06-24 DIAGNOSIS — IMO0001 Reserved for inherently not codable concepts without codable children: Secondary | ICD-10-CM | POA: Diagnosis not present

## 2013-06-25 ENCOUNTER — Encounter (HOSPITAL_COMMUNITY): Payer: Self-pay | Admitting: Emergency Medicine

## 2013-06-25 ENCOUNTER — Emergency Department (HOSPITAL_COMMUNITY)
Admission: EM | Admit: 2013-06-25 | Discharge: 2013-06-25 | Disposition: A | Discharge status: Home / Self Care | Payer: Medicaid Other | Attending: Emergency Medicine | Admitting: Emergency Medicine

## 2013-06-25 DIAGNOSIS — J069 Acute upper respiratory infection, unspecified: Secondary | ICD-10-CM

## 2013-06-25 DIAGNOSIS — G40909 Epilepsy, unspecified, not intractable, without status epilepticus: Secondary | ICD-10-CM | POA: Insufficient documentation

## 2013-06-25 DIAGNOSIS — R0981 Nasal congestion: Secondary | ICD-10-CM

## 2013-06-25 DIAGNOSIS — Z79899 Other long term (current) drug therapy: Secondary | ICD-10-CM | POA: Insufficient documentation

## 2013-06-25 DIAGNOSIS — R05 Cough: Secondary | ICD-10-CM

## 2013-06-25 MED ORDER — GUAIFENESIN ER 600 MG PO TB12: 1200.0000 mg | ORAL_TABLET | Freq: Two times a day (BID) | ORAL | Status: DC

## 2013-06-25 MED ORDER — CETIRIZINE-PSEUDOEPHEDRINE ER 5-120 MG PO TB12: 1.0000 | ORAL_TABLET | Freq: Two times a day (BID) | ORAL | Status: DC

## 2013-06-25 NOTE — ED Notes (Signed)
Pt reports that she began having nasal congestion with a cough and HA since Sunday night. Pt reports that she thought it was related to allergies or her room being cold.

## 2013-06-25 NOTE — ED Notes (Signed)
PA at bedside at this time.  

## 2013-06-25 NOTE — ED Provider Notes (Signed)
CSN: [id number]     Arrival date & time 06/25/13  2207 History  This chart was scribed for non-physician practitioner, Ivonne Andrew, PA-C working with Shanna Cisco, MD by Greggory Stallion, ED scribe. This patient was seen in room WTR2/WLPT2 and the patient's care was started at 10:37 PM.   Chief Complaint  Patient presents with  . Nasal Congestion  . Headache  . Cough   The history is provided by the patient. No language interpreter was used.   HPI Comments: Olivia Kelly is a 27 y.o. female who presents to the Emergency Department complaining of nasal congestion, rhinorrhea, cough, sneezing and headache that started 2 days ago. Pt states she has been having mild chest soreness with inhalation. She denies fever, sore throat, ear pain, nausea, emesis, diarrhea, dysuria, frequency. Pt denies recent travel. She states her daughter was recently sick with the same symptoms. Pt has not gotten a flu shot this year.    Past Medical History  Diagnosis Date  . Seizures    Past Surgical History  Procedure Laterality Date  . Cyst removal     Family History  Problem Relation Age of Onset  . Diabetes Mother   . Hypertension Mother   . Diabetes Father   . Cancer Other    History  Substance Use Topics  . Smoking status: Never Smoker   . Smokeless tobacco: Not on file  . Alcohol Use: Yes     Comment: social   OB History   Grav Para Term Preterm Abortions TAB SAB Ect Mult Living                 Review of Systems  Constitutional: Negative for fever.  HENT: Positive for congestion, rhinorrhea and sneezing. Negative for ear pain and sore throat.   Respiratory: Positive for cough.   Gastrointestinal: Negative for nausea, vomiting and diarrhea.  Genitourinary: Negative for dysuria and frequency.  Neurological: Positive for headaches.  All other systems reviewed and are negative.    Allergies  Review of patient's allergies indicates no known allergies.  Home Medications    Current Outpatient Rx  Name  Route  Sig  Dispense  Refill  . topiramate (TOPAMAX) 100 MG tablet   Oral   Take 100 mg by mouth 2 (two) times daily.         Marland Kitchen levonorgestrel (MIRENA) 20 MCG/24HR IUD   Intrauterine   1 each by Intrauterine route once.          BP 126/78  Pulse 73  Temp(Src) 97.5 F (36.4 C) (Oral)  Resp 16  SpO2 100%  Physical Exam  Nursing note and vitals reviewed. Constitutional: She is oriented to person, place, and time. She appears well-developed and well-nourished. No distress.  HENT:  Head: Normocephalic and atraumatic.  Right Ear: Tympanic membrane and external ear normal.  Left Ear: Tympanic membrane and external ear normal.  Nose: Mucosal edema and rhinorrhea present.  Mouth/Throat: Uvula is midline, oropharynx is clear and moist and mucous membranes are normal. No oral lesions. No oropharyngeal exudate.  Eyes: Conjunctivae and EOM are normal.  Neck: Normal range of motion. Neck supple. No tracheal deviation present.  Cardiovascular: Normal rate, regular rhythm and normal heart sounds.   No murmur heard. Pulmonary/Chest: Effort normal and breath sounds normal. No respiratory distress. She has no wheezes. She has no rhonchi. She has no rales.  Musculoskeletal: Normal range of motion.  Neurological: She is alert and oriented to person, place, and time.  Skin: Skin is warm and dry.  Psychiatric: She has a normal mood and affect. Her behavior is normal.    ED Course  Procedures   DIAGNOSTIC STUDIES: Oxygen Saturation is 100% on RA, normal by my interpretation.    COORDINATION OF CARE: 10:45 PM-Discussed treatment plan which includes symptomatic treatment with pt at bedside and pt agreed to plan. Advised pt to come back to the ED if her symptoms worsen.   MDM   1. URI (upper respiratory infection)   2. Cough   3. Nasal congestion      Patient well-appearing nontoxic. Symptoms consistent with URI. Discussed with patient plan is for  symptomatic treatment.   I personally performed the services described in this documentation, which was scribed in my presence. The recorded information has been reviewed and is accurate.   Angus Seller, PA-C 06/25/13 2251

## 2013-06-26 ENCOUNTER — Encounter (HOSPITAL_COMMUNITY): Payer: Self-pay | Admitting: Emergency Medicine

## 2013-06-26 ENCOUNTER — Ambulatory Visit: Payer: Medicaid Other | Admitting: Rehabilitation

## 2013-06-26 ENCOUNTER — Emergency Department (HOSPITAL_COMMUNITY)
Admission: EM | Admit: 2013-06-26 | Discharge: 2013-06-26 | Disposition: A | Discharge status: Home / Self Care | Payer: Medicaid Other | Attending: Emergency Medicine | Admitting: Emergency Medicine

## 2013-06-26 DIAGNOSIS — Z79899 Other long term (current) drug therapy: Secondary | ICD-10-CM | POA: Insufficient documentation

## 2013-06-26 DIAGNOSIS — G40909 Epilepsy, unspecified, not intractable, without status epilepticus: Secondary | ICD-10-CM | POA: Insufficient documentation

## 2013-06-26 DIAGNOSIS — IMO0001 Reserved for inherently not codable concepts without codable children: Secondary | ICD-10-CM | POA: Diagnosis not present

## 2013-06-26 DIAGNOSIS — Z975 Presence of (intrauterine) contraceptive device: Secondary | ICD-10-CM | POA: Insufficient documentation

## 2013-06-26 DIAGNOSIS — R51 Headache: Secondary | ICD-10-CM | POA: Insufficient documentation

## 2013-06-26 LAB — CBC WITH DIFFERENTIAL/PLATELET
Eosinophils Relative: 5 % (ref 0–5)
HCT: 39.1 % (ref 36.0–46.0)
Hemoglobin: 13.4 g/dL (ref 12.0–15.0)
Lymphocytes Relative: 24 % (ref 12–46)
Lymphs Abs: 1.1 10*3/uL (ref 0.7–4.0)
MCV: 85.2 fL (ref 78.0–100.0)
Monocytes Absolute: 0.6 10*3/uL (ref 0.1–1.0)
RBC: 4.59 MIL/uL (ref 3.87–5.11)
WBC: 4.7 10*3/uL (ref 4.0–10.5)

## 2013-06-26 LAB — BASIC METABOLIC PANEL
CO2: 22 mEq/L (ref 19–32)
Chloride: 102 mEq/L (ref 96–112)
Potassium: 3.4 mEq/L — ABNORMAL LOW (ref 3.5–5.1)
Sodium: 135 mEq/L (ref 135–145)

## 2013-06-26 MED ORDER — KETOROLAC TROMETHAMINE 30 MG/ML IJ SOLN
30.0000 mg | Freq: Once | INTRAMUSCULAR | Status: AC
  Administered 2013-06-26: 30 mg via INTRAVENOUS
  Filled 2013-06-26: qty 1

## 2013-06-26 MED ORDER — SODIUM CHLORIDE 0.9 % IV BOLUS (SEPSIS)
1000.0000 mL | Freq: Once | INTRAVENOUS | Status: AC
  Administered 2013-06-26: 1000 mL via INTRAVENOUS

## 2013-06-26 MED ORDER — METOCLOPRAMIDE HCL 5 MG/ML IJ SOLN
10.0000 mg | Freq: Once | INTRAMUSCULAR | Status: AC
  Administered 2013-06-26: 10 mg via INTRAVENOUS
  Filled 2013-06-26: qty 2

## 2013-06-26 NOTE — ED Provider Notes (Signed)
CSN: [id number]     Arrival date & time 06/26/13  2135 History   First MD Initiated Contact with Patient 06/26/13 2151     Chief Complaint  Patient presents with  . Seizures   (Consider location/radiation/quality/duration/timing/severity/associated sxs/prior Treatment) HPI Comments: Pt comes in with of seizure. Pt has hx of seizures, last seizure was 3 years ago. Pt has been taking her topamax 100 mg bid as prescribed, not missing any doses. She has been having some URI like sx, and this evening, while shopping, she had an aura followed by a witnessed generalized tonic clonic seizure. Pt has some headache right now. No recent fevers, no trauma.   Patient is a 27 y.o. female presenting with seizures. The history is provided by the patient.  Seizures   Past Medical History  Diagnosis Date  . Seizures    Past Surgical History  Procedure Laterality Date  . Cyst removal     Family History  Problem Relation Age of Onset  . Diabetes Mother   . Hypertension Mother   . Diabetes Father   . Cancer Other    History  Substance Use Topics  . Smoking status: Never Smoker   . Smokeless tobacco: Not on file  . Alcohol Use: Yes     Comment: social   OB History   Grav Para Term Preterm Abortions TAB SAB Ect Mult Living                 Review of Systems  Constitutional: Negative for activity change.  Respiratory: Negative for shortness of breath.   Cardiovascular: Negative for chest pain.  Gastrointestinal: Negative for nausea, vomiting and abdominal pain.  Genitourinary: Negative for dysuria.  Musculoskeletal: Negative for neck pain.  Neurological: Positive for seizures. Negative for headaches.    Allergies  Review of patient's allergies indicates no known allergies.  Home Medications   Current Outpatient Rx  Name  Route  Sig  Dispense  Refill  . levonorgestrel (MIRENA) 20 MCG/24HR IUD   Intrauterine   1 each by Intrauterine route once.         . topiramate (TOPAMAX)  100 MG tablet   Oral   Take 100 mg by mouth 2 (two) times daily.         . cetirizine-pseudoephedrine (ZYRTEC-D) 5-120 MG per tablet   Oral   Take 1 tablet by mouth 2 (two) times daily.   30 tablet   0    BP 107/61  Pulse 89  Temp(Src) 98 F (36.7 C) (Oral)  Resp 16  SpO2 100%  LMP 06/06/2013 Physical Exam  Nursing note and vitals reviewed. Constitutional: She is oriented to person, place, and time. She appears well-developed and well-nourished.  HENT:  Head: Normocephalic and atraumatic.  Eyes: EOM are normal. Pupils are equal, round, and reactive to light.  Neck: Neck supple.  Cardiovascular: Normal rate, regular rhythm and normal heart sounds.   No murmur heard. Pulmonary/Chest: Effort normal. No respiratory distress.  Abdominal: Soft. She exhibits no distension. There is no tenderness. There is no rebound and no guarding.  Neurological: She is alert and oriented to person, place, and time. No cranial nerve deficit. Coordination normal.  Skin: Skin is warm and dry.    ED Course  Procedures (including critical care time) Labs Review Labs Reviewed  BASIC METABOLIC PANEL  CBC WITH DIFFERENTIAL  URINE RAPID DRUG SCREEN (HOSP PERFORMED)   Imaging Review No results found.  EKG Interpretation   None  MDM  No diagnosis found.  Pt comes in with cc of seizure. Pt has hx of seizure on topimax. Has seizure today, first in the last 3 years. She has URI - which might have lowered the threshold. Basic labs have been ordered. Spoke with [facility], they will see her next week, recommend increasing dose by 25 mg if she has repeat episode.  Derwood Kaplan, MD 06/26/13 2239

## 2013-06-26 NOTE — ED Provider Notes (Signed)
Medical screening examination/treatment/procedure(s) were performed by non-physician practitioner and as supervising physician I was immediately available for consultation/collaboration.  Brooke Steinhilber E Elman Dettman, MD 06/26/13 0044 

## 2013-06-26 NOTE — ED Notes (Signed)
Per EMS: Pt was shopping with friend, felt dizzy so she sat down. Had seizure lasted 30 secs to a minute. No fall. Has not had seizure in years. Pt takes topamax. Pt has bronchitis, per friend. A&O x 4. NAD at this time.

## 2013-06-26 NOTE — ED Notes (Signed)
Bed: WA03 Expected date:  Expected time:  Means of arrival:  Comments: EMS 27yo F, seizure, out of seizure meds

## 2013-06-27 LAB — RAPID URINE DRUG SCREEN, HOSP PERFORMED
Amphetamines: NOT DETECTED
Benzodiazepines: NOT DETECTED
Opiates: NOT DETECTED
Tetrahydrocannabinol: NOT DETECTED

## 2013-07-02 ENCOUNTER — Ambulatory Visit: Payer: Medicaid Other | Admitting: Rehabilitation

## 2013-07-04 ENCOUNTER — Ambulatory Visit: Payer: Medicaid Other | Admitting: Rehabilitation

## 2013-07-04 DIAGNOSIS — IMO0001 Reserved for inherently not codable concepts without codable children: Secondary | ICD-10-CM | POA: Diagnosis not present

## 2013-07-08 ENCOUNTER — Ambulatory Visit: Payer: No Typology Code available for payment source | Attending: Orthopedic Surgery

## 2013-07-08 DIAGNOSIS — R293 Abnormal posture: Secondary | ICD-10-CM | POA: Insufficient documentation

## 2013-07-08 DIAGNOSIS — IMO0001 Reserved for inherently not codable concepts without codable children: Secondary | ICD-10-CM | POA: Insufficient documentation

## 2013-07-08 DIAGNOSIS — M545 Low back pain, unspecified: Secondary | ICD-10-CM | POA: Insufficient documentation

## 2013-07-11 ENCOUNTER — Ambulatory Visit: Payer: No Typology Code available for payment source | Admitting: Rehabilitation

## 2014-04-24 ENCOUNTER — Emergency Department (HOSPITAL_COMMUNITY)
Admission: EM | Admit: 2014-04-24 | Discharge: 2014-04-24 | Disposition: A | Discharge status: Home / Self Care | Payer: Medicaid Other | Attending: Emergency Medicine | Admitting: Emergency Medicine

## 2014-04-24 ENCOUNTER — Encounter (HOSPITAL_COMMUNITY): Payer: Self-pay | Admitting: Emergency Medicine

## 2014-04-24 DIAGNOSIS — Z3202 Encounter for pregnancy test, result negative: Secondary | ICD-10-CM | POA: Diagnosis not present

## 2014-04-24 DIAGNOSIS — G40909 Epilepsy, unspecified, not intractable, without status epilepticus: Secondary | ICD-10-CM

## 2014-04-24 DIAGNOSIS — R569 Unspecified convulsions: Secondary | ICD-10-CM

## 2014-04-24 DIAGNOSIS — Z79899 Other long term (current) drug therapy: Secondary | ICD-10-CM | POA: Insufficient documentation

## 2014-04-24 LAB — CBC
HEMATOCRIT: 35.5 % — AB (ref 36.0–46.0)
HEMOGLOBIN: 12.3 g/dL (ref 12.0–15.0)
MCH: 29.5 pg (ref 26.0–34.0)
MCHC: 34.6 g/dL (ref 30.0–36.0)
MCV: 85.1 fL (ref 78.0–100.0)
Platelets: 245 10*3/uL (ref 150–400)
RBC: 4.17 MIL/uL (ref 3.87–5.11)
RDW: 11.9 % (ref 11.5–15.5)
WBC: 3.1 10*3/uL — ABNORMAL LOW (ref 4.0–10.5)

## 2014-04-24 LAB — BASIC METABOLIC PANEL
ANION GAP: 18 — AB (ref 5–15)
BUN: 18 mg/dL (ref 6–23)
CHLORIDE: 102 meq/L (ref 96–112)
CO2: 18 meq/L — AB (ref 19–32)
Calcium: 8.8 mg/dL (ref 8.4–10.5)
Creatinine, Ser: 1.16 mg/dL — ABNORMAL HIGH (ref 0.50–1.10)
GFR calc non Af Amer: 65 mL/min — ABNORMAL LOW (ref >90)
GFR, EST AFRICAN AMERICAN: 75 mL/min — AB (ref >90)
Glucose, Bld: 95 mg/dL (ref 70–99)
POTASSIUM: 4.4 meq/L (ref 3.7–5.3)
Sodium: 138 mEq/L (ref 137–147)

## 2014-04-24 LAB — POC URINE PREG, ED: PREG TEST UR: NEGATIVE

## 2014-04-24 LAB — CBG MONITORING, ED: Glucose-Capillary: 102 mg/dL — ABNORMAL HIGH (ref 70–99)

## 2014-04-24 MED ORDER — IBUPROFEN 800 MG PO TABS
800.0000 mg | ORAL_TABLET | Freq: Once | ORAL | Status: AC
  Administered 2014-04-24: 800 mg via ORAL
  Filled 2014-04-24: qty 1

## 2014-04-24 NOTE — ED Notes (Signed)
Pt arrived [facility]. Pt has history of seizures. Has not taken seizure medication since Monday. Pt had 1.5 minute seizure at 12:30 today. Per EMS pt states that witness said she did not hit her head. VS stable. EMS BP 110/70, CBG 125.

## 2014-04-24 NOTE — ED Notes (Signed)
Bed: WA24 Expected date:  Expected time:  Means of arrival:  Comments: seizure 

## 2014-04-24 NOTE — Discharge Instructions (Signed)
Return to the ED with any concerns including recurrent seizures, vomiting and not able to keep down liquids, fever/chills, decreased level of alertness/lethargy, or any other alarming symptoms  You should be sure not to miss doses of your seizure medications

## 2014-04-24 NOTE — ED Provider Notes (Signed)
CSN: [id number]     Arrival date & time 04/24/14  1308 History   First MD Initiated Contact with Patient 04/24/14 1315     Chief Complaint  Patient presents with  . Seizures     (Consider location/radiation/quality/duration/timing/severity/associated sxs/prior Treatment) HPI Pt presenting after brief generalized tonic/clonic seizure.  Pt has hx of seizure disorder. Seizure was witnessed and they state patient did not strike her head. She has mild headache, but otherwise feels back to her baseline.  She has missed 3 days of her seizure meds- she had a prescription written last week but has not gotten prescription filled yet.  No fever/chills.  No dysuria or vomiting preceding seizure activity.    Past Medical History  Diagnosis Date  . Seizures    Past Surgical History  Procedure Laterality Date  . Cyst removal     Family History  Problem Relation Age of Onset  . Diabetes Mother   . Hypertension Mother   . Diabetes Father   . Cancer Other    History  Substance Use Topics  . Smoking status: Never Smoker   . Smokeless tobacco: Not on file  . Alcohol Use: Yes     Comment: social   OB History   Grav Para Term Preterm Abortions TAB SAB Ect Mult Living                 Review of Systems ROS reviewed and all otherwise negative except for mentioned in HPI    Allergies  Review of patient's allergies indicates no known allergies.  Home Medications   Prior to Admission medications   Medication Sig Start Date End Date Taking? Authorizing Provider  ibuprofen (ADVIL,MOTRIN) 200 MG tablet Take 200 mg by mouth every 6 (six) hours as needed for moderate pain.   Yes Historical Provider, MD  topiramate (TOPAMAX) 100 MG tablet Take 100 mg by mouth 2 (two) times daily.   Yes Historical Provider, MD  levonorgestrel (MIRENA) 20 MCG/24HR IUD 1 each by Intrauterine route once.    Historical Provider, MD   BP 126/63  Pulse 84  Temp(Src) 98.1 F (36.7 C) (Oral)  Resp 16  SpO2  100% Vitals reviewed Physical Exam Physical Examination: General appearance - alert, well appearing, and in no distress Mental status - alert, oriented to person, place, and time Eyes - pupils equal and reactive, extraocular eye movements intact Mouth - mucous membranes moist, pharynx normal without lesions Chest - clear to auscultation, no wheezes, rales or rhonchi, symmetric air entry Heart - normal rate, regular rhythm, normal S1, S2, no murmurs, rubs, clicks or gallops Abdomen - soft, nontender, nondistended, no masses or organomegaly Neurological - alert, oriented x 3, cranial nerves 2-12 tested and intact, strength 5/5 in extremities x 4, sensation intact Extremities - peripheral pulses normal, no pedal edema, no clubbing or cyanosis Skin - normal coloration and turgor, no rashes  ED Course  Procedures (including critical care time) Labs Review Labs Reviewed  BASIC METABOLIC PANEL - Abnormal; Notable for the following:    CO2 18 (*)    Creatinine, Ser 1.16 (*)    GFR calc non Af Amer 65 (*)    GFR calc Af Amer 75 (*)    Anion gap 18 (*)    All other components within normal limits  CBC - Abnormal; Notable for the following:    WBC 3.1 (*)    HCT 35.5 (*)    All other components within normal limits  CBG MONITORING, ED -  Abnormal; Notable for the following:    Glucose-Capillary 102 (*)    All other components within normal limits  POC URINE PREG, ED    Imaging Review No results found.   EKG Interpretation None      MDM   Final diagnoses:  Seizure  Seizure disorder    Pt with hx of seizure disorder presenting with c/o seizure.  Pt has rx for her seizure meds that her sister is currently getting filled so that she can get back on her meds- missed 3 days.  Discharged with strict return precautions.  Pt agreeable with plan.    Ethelda ChickMartha K Linker, MD 04/26/14 [phone]

## 2014-09-05 ENCOUNTER — Encounter (HOSPITAL_COMMUNITY): Payer: Self-pay | Admitting: Emergency Medicine

## 2014-09-05 DIAGNOSIS — G40909 Epilepsy, unspecified, not intractable, without status epilepticus: Secondary | ICD-10-CM | POA: Insufficient documentation

## 2014-09-05 DIAGNOSIS — Z3202 Encounter for pregnancy test, result negative: Secondary | ICD-10-CM | POA: Insufficient documentation

## 2014-09-05 DIAGNOSIS — Z79899 Other long term (current) drug therapy: Secondary | ICD-10-CM | POA: Insufficient documentation

## 2014-09-05 DIAGNOSIS — N1330 Unspecified hydronephrosis: Secondary | ICD-10-CM | POA: Insufficient documentation

## 2014-09-05 DIAGNOSIS — N390 Urinary tract infection, site not specified: Secondary | ICD-10-CM | POA: Insufficient documentation

## 2014-09-05 NOTE — ED Notes (Signed)
Pt. reports intermittent low abdominal pain with nausea for 2 months , denies emesis or diarrhea.

## 2014-09-06 ENCOUNTER — Emergency Department (HOSPITAL_COMMUNITY)
Admission: EM | Admit: 2014-09-06 | Discharge: 2014-09-06 | Disposition: A | Discharge status: Home / Self Care | Payer: Self-pay | Attending: Emergency Medicine | Admitting: Emergency Medicine

## 2014-09-06 ENCOUNTER — Encounter (HOSPITAL_COMMUNITY): Payer: Self-pay | Admitting: Radiology

## 2014-09-06 ENCOUNTER — Emergency Department (HOSPITAL_COMMUNITY): Payer: Medicaid Other

## 2014-09-06 DIAGNOSIS — R109 Unspecified abdominal pain: Secondary | ICD-10-CM

## 2014-09-06 DIAGNOSIS — N390 Urinary tract infection, site not specified: Secondary | ICD-10-CM

## 2014-09-06 DIAGNOSIS — N1330 Unspecified hydronephrosis: Secondary | ICD-10-CM

## 2014-09-06 LAB — CBC WITH DIFFERENTIAL/PLATELET
BASOS ABS: 0 10*3/uL (ref 0.0–0.1)
BASOS PCT: 1 % (ref 0–1)
EOS ABS: 0.1 10*3/uL (ref 0.0–0.7)
EOS PCT: 1 % (ref 0–5)
HEMATOCRIT: 38 % (ref 36.0–46.0)
HEMOGLOBIN: 13.1 g/dL (ref 12.0–15.0)
LYMPHS ABS: 2 10*3/uL (ref 0.7–4.0)
Lymphocytes Relative: 44 % (ref 12–46)
MCH: 30 pg (ref 26.0–34.0)
MCHC: 34.5 g/dL (ref 30.0–36.0)
MCV: 87 fL (ref 78.0–100.0)
Monocytes Absolute: 0.3 10*3/uL (ref 0.1–1.0)
Monocytes Relative: 8 % (ref 3–12)
NEUTROS ABS: 2 10*3/uL (ref 1.7–7.7)
NEUTROS PCT: 46 % (ref 43–77)
Platelets: 292 10*3/uL (ref 150–400)
RBC: 4.37 MIL/uL (ref 3.87–5.11)
RDW: 12.3 % (ref 11.5–15.5)
WBC: 4.4 10*3/uL (ref 4.0–10.5)

## 2014-09-06 LAB — COMPREHENSIVE METABOLIC PANEL
ALT: 17 U/L (ref 0–35)
AST: 19 U/L (ref 0–37)
Albumin: 4.2 g/dL (ref 3.5–5.2)
Alkaline Phosphatase: 49 U/L (ref 39–117)
Anion gap: 6 (ref 5–15)
BILIRUBIN TOTAL: 0.5 mg/dL (ref 0.3–1.2)
BUN: 7 mg/dL (ref 6–23)
CHLORIDE: 109 meq/L (ref 96–112)
CO2: 23 mmol/L (ref 19–32)
Calcium: 9.4 mg/dL (ref 8.4–10.5)
Creatinine, Ser: 0.76 mg/dL (ref 0.50–1.10)
GFR calc Af Amer: >90 mL/min (ref >90)
GLUCOSE: 103 mg/dL — AB (ref 70–99)
Potassium: 3.3 mmol/L — ABNORMAL LOW (ref 3.5–5.1)
SODIUM: 138 mmol/L (ref 135–145)
Total Protein: 7.1 g/dL (ref 6.0–8.3)

## 2014-09-06 LAB — URINALYSIS, ROUTINE W REFLEX MICROSCOPIC
BILIRUBIN URINE: NEGATIVE
GLUCOSE, UA: NEGATIVE mg/dL
HGB URINE DIPSTICK: NEGATIVE
KETONES UR: NEGATIVE mg/dL
Nitrite: POSITIVE — AB
PH: 7 (ref 5.0–8.0)
Protein, ur: NEGATIVE mg/dL
Specific Gravity, Urine: 1.012 (ref 1.005–1.030)
Urobilinogen, UA: 0.2 mg/dL (ref 0.0–1.0)

## 2014-09-06 LAB — URINE MICROSCOPIC-ADD ON

## 2014-09-06 LAB — PREGNANCY, URINE: Preg Test, Ur: NEGATIVE

## 2014-09-06 MED ORDER — SODIUM CHLORIDE 0.9 % IV SOLN: INTRAVENOUS | Status: DC

## 2014-09-06 MED ORDER — SODIUM CHLORIDE 0.9 % IV BOLUS (SEPSIS)
250.0000 mL | Freq: Once | INTRAVENOUS | Status: AC
  Administered 2014-09-06: 250 mL via INTRAVENOUS

## 2014-09-06 MED ORDER — CEPHALEXIN 500 MG PO CAPS: 500.0000 mg | ORAL_CAPSULE | Freq: Four times a day (QID) | ORAL | Status: DC

## 2014-09-06 MED ORDER — DEXTROSE 5 % IV SOLN
1.0000 g | Freq: Once | INTRAVENOUS | Status: AC
  Administered 2014-09-06: 1 g via INTRAVENOUS
  Filled 2014-09-06: qty 10

## 2014-09-06 MED ORDER — IOHEXOL 300 MG/ML  SOLN
25.0000 mL | Freq: Once | INTRAMUSCULAR | Status: AC | PRN
  Administered 2014-09-06: 25 mL via ORAL

## 2014-09-06 MED ORDER — ONDANSETRON HCL 4 MG/2ML IJ SOLN
4.0000 mg | Freq: Once | INTRAMUSCULAR | Status: AC
  Administered 2014-09-06: 4 mg via INTRAVENOUS
  Filled 2014-09-06: qty 2

## 2014-09-06 MED ORDER — IOHEXOL 300 MG/ML  SOLN
80.0000 mL | Freq: Once | INTRAMUSCULAR | Status: AC | PRN
  Administered 2014-09-06: 80 mL via INTRAVENOUS

## 2014-09-06 NOTE — ED Provider Notes (Signed)
CSN: [id number]     Arrival date & time 09/05/14  2327 History   First MD Initiated Contact with Patient 09/06/14 0029     This chart was scribed for Vanetta Mulders, MD by Arlan Organ, ED Scribe. This patient was seen in room D31C/D31C and the patient's care was started 4:33 AM.   Chief Complaint  Patient presents with  . Abdominal Pain   Patient is a 29 y.o. female presenting with abdominal pain. The history is provided by the patient. No language interpreter was used.  Abdominal Pain Pain location:  LLQ and RLQ Pain radiates to:  Back Pain severity:  Moderate Timing:  Intermittent Progression:  Worsening Chronicity:  New Relieved by:  None tried Worsened by:  Nothing tried Ineffective treatments:  None tried Associated symptoms: nausea   Associated symptoms: no chest pain, no chills, no cough, no diarrhea, no dysuria, no fever, no shortness of breath, no sore throat, no vaginal bleeding, no vaginal discharge and no vomiting     HPI Comments: Olivia Kelly is a 29 y.o. female with a PMHx of seizures who presents to the Emergency Department complaining of intermittent lower abdominal pain that radiates to the back x few months that has worsened in last 2 days. Currently she rates pain 7/10 and described as a "pain". No recent dysuria, vaginal discharge, vaginal bleeding, diarrhea,or vomiting. LNMP 08/19/14. Pt had [facility]. No known allergies to medications.  Past Medical History  Diagnosis Date  . Seizures    Past Surgical History  Procedure Laterality Date  . Cyst removal     Family History  Problem Relation Age of Onset  . Diabetes Mother   . Hypertension Mother   . Diabetes Father   . Cancer Other    History  Substance Use Topics  . Smoking status: Never Smoker   . Smokeless tobacco: Not on file  . Alcohol Use: Yes     Comment: social   OB History    No data available     Review of Systems  Constitutional:  Negative for fever and chills.  HENT: Negative for congestion, rhinorrhea and sore throat.   Eyes: Negative for visual disturbance.  Respiratory: Negative for cough and shortness of breath.   Cardiovascular: Negative for chest pain and leg swelling.  Gastrointestinal: Positive for nausea and abdominal pain. Negative for vomiting and diarrhea.  Genitourinary: Negative for dysuria, vaginal bleeding and vaginal discharge.  Musculoskeletal: Positive for back pain.  Skin: Negative for rash.  Neurological: Negative for headaches.  Hematological: Does not bruise/bleed easily.  Psychiatric/Behavioral: Negative for confusion.      Allergies  Review of patient's allergies indicates no known allergies.  Home Medications   Prior to Admission medications   Medication Sig Start Date End Date Taking? Authorizing Provider  levonorgestrel (MIRENA) 20 MCG/24HR IUD 1 each by Intrauterine route once.   Yes Historical Provider, MD  topiramate (TOPAMAX) 100 MG tablet Take 100 mg by mouth 2 (two) times daily.   Yes Historical Provider, MD  cephALEXin (KEFLEX) 500 MG capsule Take 1 capsule (500 mg total) by mouth 4 (four) times daily. 09/06/14   Vanetta Mulders, MD  ibuprofen (ADVIL,MOTRIN) 200 MG tablet Take 200 mg by mouth every 6 (six) hours as needed for moderate pain.    Historical Provider, MD   Triage Vitals: BP 110/75 mmHg  Pulse 59  Temp(Src) 98.4 F (36.9 C) (Oral)  Resp 18  SpO2 100%  LMP 08/19/2014  Physical Exam  Constitutional: She is oriented to person, place, and time. She appears well-developed and well-nourished. No distress.  HENT:  Head: Normocephalic and atraumatic.  Mouth/Throat: Oropharynx is clear and moist.  Eyes: Conjunctivae and EOM are normal. Pupils are equal, round, and reactive to light.  Sclera clear  Neck: Normal range of motion.  Cardiovascular: Normal rate, regular rhythm and normal heart sounds.   No murmur heard. Pulmonary/Chest: Effort normal and breath  sounds normal.  Abdominal: Soft. Bowel sounds are normal. She exhibits no distension. There is no tenderness.  Musculoskeletal: Normal range of motion.  Neurological: She is alert and oriented to person, place, and time. No cranial nerve deficit. She exhibits normal muscle tone. Coordination normal.  Skin: Skin is warm and dry.  Psychiatric: She has a normal mood and affect. Judgment normal.  Nursing note and vitals reviewed.   ED Course  Procedures (including critical care time)  DIAGNOSTIC STUDIES: Oxygen Saturation is 100% on RA, Normal by my interpretation.    COORDINATION OF CARE: 4:33 AM-Discussed treatment plan with pt at bedside and pt agreed to plan.     Labs Review Labs Reviewed  COMPREHENSIVE METABOLIC PANEL - Abnormal; Notable for the following:    Potassium 3.3 (*)    Glucose, Bld 103 (*)    All other components within normal limits  URINALYSIS, ROUTINE W REFLEX MICROSCOPIC - Abnormal; Notable for the following:    APPearance CLOUDY (*)    Nitrite POSITIVE (*)    Leukocytes, UA MODERATE (*)    All other components within normal limits  URINE MICROSCOPIC-ADD ON - Abnormal; Notable for the following:    Squamous Epithelial / LPF FEW (*)    Bacteria, UA MANY (*)    All other components within normal limits  URINE CULTURE  CBC WITH DIFFERENTIAL  PREGNANCY, URINE   Results for orders placed or performed during the hospital encounter of 09/06/14  CBC with Differential  Result Value Ref Range   WBC 4.4 4.0 - 10.5 K/uL   RBC 4.37 3.87 - 5.11 MIL/uL   Hemoglobin 13.1 12.0 - 15.0 g/dL   HCT 40.9 81.1 - 91.4 %   MCV 87.0 78.0 - 100.0 fL   MCH 30.0 26.0 - 34.0 pg   MCHC 34.5 30.0 - 36.0 g/dL   RDW 78.2 95.6 - 21.3 %   Platelets 292 150 - 400 K/uL   Neutrophils Relative % 46 43 - 77 %   Neutro Abs 2.0 1.7 - 7.7 K/uL   Lymphocytes Relative 44 12 - 46 %   Lymphs Abs 2.0 0.7 - 4.0 K/uL   Monocytes Relative 8 3 - 12 %   Monocytes Absolute 0.3 0.1 - 1.0 K/uL    Eosinophils Relative 1 0 - 5 %   Eosinophils Absolute 0.1 0.0 - 0.7 K/uL   Basophils Relative 1 0 - 1 %   Basophils Absolute 0.0 0.0 - 0.1 K/uL  Comprehensive metabolic panel  Result Value Ref Range   Sodium 138 135 - 145 mmol/L   Potassium 3.3 (L) 3.5 - 5.1 mmol/L   Chloride 109 96 - 112 mEq/L   CO2 23 19 - 32 mmol/L   Glucose, Bld 103 (H) 70 - 99 mg/dL   BUN 7 6 - 23 mg/dL   Creatinine, Ser 0.86 0.50 - 1.10 mg/dL   Calcium 9.4 8.4 - 57.8 mg/dL   Total Protein 7.1 6.0 - 8.3 g/dL   Albumin 4.2 3.5 - 5.2 g/dL   AST 19  0 - 37 U/L   ALT 17 0 - 35 U/L   Alkaline Phosphatase 49 39 - 117 U/L   Total Bilirubin 0.5 0.3 - 1.2 mg/dL   GFR calc non Af Amer >90 >90 mL/min   GFR calc Af Amer >90 >90 mL/min   Anion gap 6 5 - 15  Pregnancy, urine  Result Value Ref Range   Preg Test, Ur NEGATIVE NEGATIVE  Urinalysis, Routine w reflex microscopic  Result Value Ref Range   Color, Urine YELLOW YELLOW   APPearance CLOUDY (A) CLEAR   Specific Gravity, Urine 1.012 1.005 - 1.030   pH 7.0 5.0 - 8.0   Glucose, UA NEGATIVE NEGATIVE mg/dL   Hgb urine dipstick NEGATIVE NEGATIVE   Bilirubin Urine NEGATIVE NEGATIVE   Ketones, ur NEGATIVE NEGATIVE mg/dL   Protein, ur NEGATIVE NEGATIVE mg/dL   Urobilinogen, UA 0.2 0.0 - 1.0 mg/dL   Nitrite POSITIVE (A) NEGATIVE   Leukocytes, UA MODERATE (A) NEGATIVE  Urine microscopic-add on  Result Value Ref Range   Squamous Epithelial / LPF FEW (A) RARE   WBC, UA 11-20 <3 WBC/hpf   Bacteria, UA MANY (A) RARE     Imaging Review Ct Abdomen Pelvis W Contrast  09/06/2014   CLINICAL DATA:  Chronic lower pelvic pain for 1 year, worsening today. Initial encounter.  EXAM: CT ABDOMEN AND PELVIS WITH CONTRAST  TECHNIQUE: Multidetector CT imaging of the abdomen and pelvis was performed using the standard protocol following bolus administration of intravenous contrast.  CONTRAST:  25mL OMNIPAQUE IOHEXOL 300 MG/ML SOLN, 80mL OMNIPAQUE IOHEXOL 300 MG/ML SOLN  COMPARISON:   Pelvic ultrasound performed 11/17/2006  FINDINGS: The visualized lung bases are clear.  The liver and spleen are unremarkable in appearance. The gallbladder is within normal limits. The pancreas and adrenal glands are unremarkable.  There is severe chronic left-sided hydronephrosis, with marked dilatation of the left renal pelvis. Mild left-sided renal atrophy and scarring are noted. Tiny 2 mm stone is noted dependently within the dilated left renal pelvis. 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 The left ureter is not well characterized. The right kidney is unremarkable in appearance. No perinephric stranding is seen.  The small bowel is unremarkable in appearance. The stomach is within normal limits. No acute vascular abnormalities are seen.  The appendix is grossly unremarkable in appearance, though difficult to fully assess. There is no evidence of appendicitis. The colon is unremarkable in appearance.  The bladder is mildly distended and grossly unremarkable. The uterus is grossly unremarkable in appearance. An intrauterine device is noted in expected location at the fundus of the uterus. The ovaries are relatively symmetric. No suspicious adnexal masses are seen. Trace free fluid within the pelvis is likely physiologic in nature. No inguinal lymphadenopathy is seen.  No acute osseous abnormalities are identified.  IMPRESSION: 1. No acute abnormality seen to explain the patient's symptoms. Trace free fluid in the pelvis is likely physiologic in nature. If the patient's pelvic symptoms persist, follow-up pelvic ultrasound could be considered for further evaluation, as deemed clinically appropriate. 2. Severe chronic left-sided hydronephrosis, with marked dilatation of the left renal pelvis. Mild left-sided renal atrophy and scarring noted. 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 Tiny 2 mm  stone incidentally noted dependently within the dilated left renal pelvis.   Electronically Signed   By: Roanna Raider M.D.   On: 09/06/2014 02:33  EKG Interpretation None      MDM   Final diagnoses:  Abdominal pain  UTI (lower urinary tract infection)  Hydronephrosis, unspecified hydronephrosis type    Patient with a urinalysis suggestive of urinary tract infection. Patient's CT scan showed marked hydronephrosis on the left side that appears to be chronic in nature. Patient received Rocephin 1 g IV piggyback in the emergency department for the urinary tract infection. Urine culture sent. Patient will be continued on Keflex. Due to the chronic hydronephrosis. Patient referred to urology and nephrology. Patient understands the importance of following up with them. Patient symptoms could be explained by the urinary tract infection. Patient nontoxic no acute distress. Patient's renal function is normal.  I personally performed the services described in this documentation, which was scribed in my presence. The recorded information has been reviewed and is accurate.    Vanetta Mulders, MD 09/06/14 [phone]

## 2014-09-06 NOTE — Discharge Instructions (Signed)
Take antibiotic as directed for the urinary tract infection. Call urology for follow-up and is well information for follow-up with nephrology provided. Return for any new or worse symptoms. Follow-up is important. The reason for follow-up with urology is the blockage of your ureter.

## 2014-09-09 LAB — URINE CULTURE: Colony Count: >=100000

## 2014-09-10 ENCOUNTER — Telehealth (HOSPITAL_BASED_OUTPATIENT_CLINIC_OR_DEPARTMENT_OTHER): Payer: Self-pay | Admitting: Emergency Medicine

## 2014-09-10 NOTE — Telephone Encounter (Signed)
Post ED Visit - Positive Culture Follow-up  Culture report reviewed by antimicrobial stewardship pharmacist: []  Wes Dulaney, Pharm.D., BCPS [x]  Celedonio MiyamotoJeremy Frens, Pharm.D., BCPS []  Georgina PillionElizabeth Martin, Pharm.D., BCPS []  [name], [place]Pharm.D., BCPS, AAHIVP []  Estella HuskMichelle Turner, Pharm.D., BCPS, AAHIVP []  Elder CyphersLorie Poole, [address] BoulevardPharm.D., BCPS  Positive Urine culture Treated with Cephalexin, organism sensitive to the same and no further patient follow-up is required at this time.  [name], Miriah Maruyama Chaney 09/10/2014, 4:15 PM

## 2014-09-20 ENCOUNTER — Emergency Department (HOSPITAL_COMMUNITY)
Admission: EM | Admit: 2014-09-20 | Discharge: 2014-09-20 | Disposition: A | Discharge status: Home / Self Care | Payer: No Typology Code available for payment source | Attending: Emergency Medicine | Admitting: Emergency Medicine

## 2014-09-20 ENCOUNTER — Encounter (HOSPITAL_COMMUNITY): Payer: Self-pay | Admitting: Emergency Medicine

## 2014-09-20 DIAGNOSIS — Z792 Long term (current) use of antibiotics: Secondary | ICD-10-CM | POA: Insufficient documentation

## 2014-09-20 DIAGNOSIS — G40909 Epilepsy, unspecified, not intractable, without status epilepticus: Secondary | ICD-10-CM | POA: Insufficient documentation

## 2014-09-20 DIAGNOSIS — Z79899 Other long term (current) drug therapy: Secondary | ICD-10-CM | POA: Insufficient documentation

## 2014-09-20 MED ORDER — TOPIRAMATE 100 MG PO TABS
100.0000 mg | ORAL_TABLET | Freq: Two times a day (BID) | ORAL | Status: DC
Start: 2014-09-20 — End: 2016-08-21

## 2014-09-20 MED ORDER — ACETAMINOPHEN 325 MG PO TABS
650.0000 mg | ORAL_TABLET | Freq: Four times a day (QID) | ORAL | Status: DC | PRN
  Administered 2014-09-20: 650 mg via ORAL
  Filled 2014-09-20: qty 2

## 2014-09-20 MED ORDER — TOPIRAMATE 25 MG PO TABS
100.0000 mg | ORAL_TABLET | Freq: Once | ORAL | Status: AC
  Administered 2014-09-20: 100 mg via ORAL
  Filled 2014-09-20: qty 4

## 2014-09-20 NOTE — ED Provider Notes (Signed)
CSN: [id number]     Arrival date & time 09/20/14  1921 History   First MD Initiated Contact with Patient 09/20/14 1949     Chief Complaint  Patient presents with  . Seizures     (Consider location/radiation/quality/duration/timing/severity/associated sxs/prior Treatment) HPI Patient reports she had a grand mal seizure tonight lasting prostate 5 minutes. She admits to running out of Topamax approximately week ago. She is presently asymptomatic. No injury as result of seizure. No treatment prior to coming here. Past Medical History  Diagnosis Date  . Seizures    Past Surgical History  Procedure Laterality Date  . Cyst removal     Family History  Problem Relation Age of Onset  . Diabetes Mother   . Hypertension Mother   . Diabetes Father   . Cancer Other    History  Substance Use Topics  . Smoking status: Never Smoker   . Smokeless tobacco: Not on file  . Alcohol Use: Yes     Comment: social   OB History    No data available     Review of Systems  Constitutional: Negative.   HENT: Negative.   Respiratory: Negative.   Cardiovascular: Negative.   Gastrointestinal: Negative.   Musculoskeletal: Negative.   Skin: Negative.   Neurological: Positive for seizures.  Psychiatric/Behavioral: Negative.   All other systems reviewed and are negative.     Allergies  Review of patient's allergies indicates no known allergies.  Home Medications   Prior to Admission medications   Medication Sig Start Date End Date Taking? Authorizing Provider  cephALEXin (KEFLEX) 500 MG capsule Take 1 capsule (500 mg total) by mouth 4 (four) times daily. 09/06/14   Vanetta Mulders, MD  ibuprofen (ADVIL,MOTRIN) 200 MG tablet Take 200 mg by mouth every 6 (six) hours as needed for moderate pain.    Historical Provider, MD  levonorgestrel (MIRENA) 20 MCG/24HR IUD 1 each by Intrauterine route once.    Historical Provider, MD  topiramate (TOPAMAX) 100 MG tablet Take 100 mg by mouth 2 (two) times  daily.    Historical Provider, MD   BP 112/64 mmHg  Pulse 94  Temp(Src) 98 F (36.7 C) (Oral)  Resp 18  SpO2 100%  LMP 08/19/2014 Physical Exam  Constitutional: She is oriented to person, place, and time. She appears well-developed and well-nourished.  HENT:  Head: Normocephalic and atraumatic.  Eyes: Conjunctivae are normal. Pupils are equal, round, and reactive to light.  Neck: Neck supple. No tracheal deviation present. No thyromegaly present.  Cardiovascular: Normal rate and regular rhythm.   No murmur heard. Pulmonary/Chest: Effort normal and breath sounds normal.  Abdominal: Soft. Bowel sounds are normal. She exhibits no distension. There is no tenderness.  Musculoskeletal: Normal range of motion. She exhibits no edema or tenderness.  Neurological: She is alert and oriented to person, place, and time. She has normal reflexes. No cranial nerve deficit. Coordination normal.  DTRs symmetric bilaterally at knee jerk ankle jerk biceps historical and bilaterally. Gait normal  Skin: Skin is warm and dry. No rash noted.  Psychiatric: She has a normal mood and affect.  Nursing note and vitals reviewed.   ED Course  Procedures (including critical care time) Labs Review Labs Reviewed - No data to display  Imaging Review No results found.   EKG Interpretation None     One dose Topamax given in the emergency department prior to discharge. 845 PM patient alert and reported Glasgow Coma Score 15 MDM Final diagnoses:  None  plan prescription Topamax for one week. She reports that she has Topamax available to her in 2 days  Diagnosis seizure disorder     Doug SouSam Cong Hightower, MD 09/20/14 2050

## 2014-09-20 NOTE — Discharge Instructions (Signed)
Epilepsy Take your Topamax as directed. Return if concern for any reason. You may have more seizures until adequate medication is in your system People with epilepsy have times when they shake and jerk uncontrollably (seizures). This happens when there is a sudden change in brain function. Epilepsy may have many possible causes. Anything that disturbs the normal pattern of brain cell activity can lead to seizures. HOME CARE   Follow your doctor's instructions about driving and safety during normal activities.  Get enough sleep.  Only take medicine as told by your doctor.  Avoid things that you know can cause you to have seizures (triggers).  Write down when your seizures happen and what you remember about each seizure. Write down anything you think may have caused the seizure to happen.  Tell the people you live and work with that you have seizures. Make sure they know how to help you. They should:  Cushion your head and body.  Turn you on your side.  Not restrain you.  Not place anything inside your mouth.  Call for local emergency medical help if there is any question about what has happened.  Keep all follow-up visits with your doctor. This is very important. GET HELP IF:  You get an infection or start to feel sick. You may have more seizures when you are sick.  You are having seizures more often.  Your seizure pattern is changing. GET HELP RIGHT AWAY IF:   A seizure does not stop after a few seconds or minutes.  A seizure causes you to have trouble breathing.  A seizure gives you a very bad headache.  A seizure makes you unable to speak or use a part of your body. Document Released: 06/19/2009 Document Revised: 06/12/2013 Document Reviewed: 04/03/2013 [facility] Patient Information 2015 [employer] [place]. This information is not intended to replace advice given to you by your health care provider. Make sure you discuss any questions you have with your health care  provider.

## 2014-09-20 NOTE — ED Notes (Addendum)
Pt states hx that she has hx of seizures.  Pt's mother states that the seizure happened at a restaurant and "didn't last too long".  When asked how long this meant, mom states "no more than 5 minutes".  Pt states that she has been out of her topamax x 1 wk.  States that she is supposed to get some more on Monday.  Pt states that she urinated on herself during the incident.  Pt appears postictal at this time.

## 2014-10-01 ENCOUNTER — Other Ambulatory Visit (HOSPITAL_COMMUNITY): Payer: Self-pay | Admitting: Urology

## 2014-10-01 DIAGNOSIS — N1330 Unspecified hydronephrosis: Secondary | ICD-10-CM

## 2014-10-15 ENCOUNTER — Ambulatory Visit (HOSPITAL_COMMUNITY)
Admission: RE | Admit: 2014-10-15 | Discharge: 2014-10-15 | Disposition: A | Discharge status: Home / Self Care | Payer: Medicaid Other | Source: Ambulatory Visit | Attending: Urology | Admitting: Urology

## 2014-10-15 DIAGNOSIS — N1330 Unspecified hydronephrosis: Secondary | ICD-10-CM | POA: Insufficient documentation

## 2014-10-15 MED ORDER — TECHNETIUM TC 99M MERTIATIDE
15.4000 | Freq: Once | INTRAVENOUS | Status: AC | PRN
  Administered 2014-10-15: 15 via INTRAVENOUS

## 2014-10-15 MED ORDER — FUROSEMIDE 10 MG/ML IJ SOLN
29.1000 mg | Freq: Once | INTRAMUSCULAR | Status: AC
  Administered 2014-10-15: 29.1 mg via INTRAVENOUS
  Filled 2014-10-15: qty 4

## 2014-10-27 ENCOUNTER — Other Ambulatory Visit: Payer: Self-pay | Admitting: Urology

## 2014-10-28 ENCOUNTER — Encounter (HOSPITAL_BASED_OUTPATIENT_CLINIC_OR_DEPARTMENT_OTHER): Payer: Self-pay | Admitting: *Deleted

## 2014-10-28 NOTE — Progress Notes (Signed)
[facility]urine pregnancy on arrival.Instructed Npo after Mn of solids,clear liquids until 0600,then Npo.will take topamax in am.

## 2014-10-29 ENCOUNTER — Ambulatory Visit (HOSPITAL_BASED_OUTPATIENT_CLINIC_OR_DEPARTMENT_OTHER): Payer: Self-pay | Admitting: Anesthesiology

## 2014-10-29 ENCOUNTER — Ambulatory Visit (HOSPITAL_BASED_OUTPATIENT_CLINIC_OR_DEPARTMENT_OTHER)
Admission: RE | Admit: 2014-10-29 | Discharge: 2014-10-29 | Disposition: A | Discharge status: Home / Self Care | Payer: Self-pay | Source: Ambulatory Visit | Attending: Urology | Admitting: Urology

## 2014-10-29 ENCOUNTER — Encounter (HOSPITAL_BASED_OUTPATIENT_CLINIC_OR_DEPARTMENT_OTHER): Payer: Self-pay | Admitting: Anesthesiology

## 2014-10-29 ENCOUNTER — Ambulatory Visit (HOSPITAL_BASED_OUTPATIENT_CLINIC_OR_DEPARTMENT_OTHER): Payer: Medicaid Other | Admitting: Anesthesiology

## 2014-10-29 ENCOUNTER — Encounter (HOSPITAL_BASED_OUTPATIENT_CLINIC_OR_DEPARTMENT_OTHER)
Admission: RE | Disposition: A | Discharge status: Home / Self Care | Payer: Self-pay | Source: Ambulatory Visit | Attending: Urology

## 2014-10-29 DIAGNOSIS — N132 Hydronephrosis with renal and ureteral calculous obstruction: Secondary | ICD-10-CM | POA: Insufficient documentation

## 2014-10-29 DIAGNOSIS — N390 Urinary tract infection, site not specified: Secondary | ICD-10-CM | POA: Insufficient documentation

## 2014-10-29 DIAGNOSIS — N131 Hydronephrosis with ureteral stricture, not elsewhere classified: Secondary | ICD-10-CM | POA: Insufficient documentation

## 2014-10-29 HISTORY — PX: CYSTOSCOPY WITH RETROGRADE PYELOGRAM, URETEROSCOPY AND STENT PLACEMENT: SHX5789

## 2014-10-29 LAB — POCT I-STAT, CHEM 8
BUN: 11 mg/dL (ref 6–23)
CALCIUM ION: 1.3 mmol/L — AB (ref 1.12–1.23)
Chloride: 106 mmol/L (ref 96–112)
Creatinine, Ser: 0.8 mg/dL (ref 0.50–1.10)
Glucose, Bld: 88 mg/dL (ref 70–99)
HCT: 41 % (ref 36.0–46.0)
Hemoglobin: 13.9 g/dL (ref 12.0–15.0)
POTASSIUM: 3.5 mmol/L (ref 3.5–5.1)
Sodium: 143 mmol/L (ref 135–145)
TCO2: 18 mmol/L (ref 0–100)

## 2014-10-29 LAB — POCT PREGNANCY, URINE: Preg Test, Ur: NEGATIVE

## 2014-10-29 SURGERY — CYSTOURETEROSCOPY, WITH RETROGRADE PYELOGRAM AND STENT INSERTION
Anesthesia: General | Site: Ureter | Laterality: Left

## 2014-10-29 MED ORDER — LACTATED RINGERS IV SOLN
INTRAVENOUS | Status: DC
  Administered 2014-10-29: 12:00:00 via INTRAVENOUS
  Filled 2014-10-29: qty 1000

## 2014-10-29 MED ORDER — GENTAMICIN IN SALINE 1.6-0.9 MG/ML-% IV SOLN
80.0000 mg | INTRAVENOUS | Status: AC
  Administered 2014-10-29: 290 mg via INTRAVENOUS
  Filled 2014-10-29: qty 50

## 2014-10-29 MED ORDER — FENTANYL CITRATE 0.05 MG/ML IJ SOLN
25.0000 ug | INTRAMUSCULAR | Status: DC | PRN
  Filled 2014-10-29: qty 1

## 2014-10-29 MED ORDER — PROPOFOL 10 MG/ML IV BOLUS
INTRAVENOUS | Status: DC | PRN
  Administered 2014-10-29: 150 mg via INTRAVENOUS

## 2014-10-29 MED ORDER — FENTANYL CITRATE 0.05 MG/ML IJ SOLN
INTRAMUSCULAR | Status: AC
  Filled 2014-10-29: qty 4

## 2014-10-29 MED ORDER — SODIUM CHLORIDE 0.9 % IR SOLN
Status: DC | PRN
  Administered 2014-10-29: 6000 mL via INTRAVESICAL

## 2014-10-29 MED ORDER — OXYCODONE-ACETAMINOPHEN 5-325 MG PO TABS: 1.0000 | ORAL_TABLET | Freq: Four times a day (QID) | ORAL | Status: DC | PRN

## 2014-10-29 MED ORDER — CEPHALEXIN 500 MG PO CAPS: 500.0000 mg | ORAL_CAPSULE | Freq: Two times a day (BID) | ORAL | Status: DC

## 2014-10-29 MED ORDER — FENTANYL CITRATE 0.05 MG/ML IJ SOLN
INTRAMUSCULAR | Status: DC | PRN
  Administered 2014-10-29 (×2): 25 ug via INTRAVENOUS
  Administered 2014-10-29: 50 ug via INTRAVENOUS

## 2014-10-29 MED ORDER — IOHEXOL 350 MG/ML SOLN
INTRAVENOUS | Status: DC | PRN
  Administered 2014-10-29: 20 mL via URETHRAL

## 2014-10-29 MED ORDER — OXYCODONE HCL 5 MG PO TABS
5.0000 mg | ORAL_TABLET | Freq: Four times a day (QID) | ORAL | Status: DC | PRN
  Filled 2014-10-29: qty 1

## 2014-10-29 MED ORDER — DEXAMETHASONE SODIUM PHOSPHATE 4 MG/ML IJ SOLN
INTRAMUSCULAR | Status: DC | PRN
  Administered 2014-10-29: 10 mg via INTRAVENOUS

## 2014-10-29 MED ORDER — MIDAZOLAM HCL 2 MG/2ML IJ SOLN
INTRAMUSCULAR | Status: AC
  Filled 2014-10-29: qty 2

## 2014-10-29 MED ORDER — ACETAMINOPHEN 160 MG/5ML PO SOLN
650.0000 mg | Freq: Four times a day (QID) | ORAL | Status: DC | PRN
  Administered 2014-10-29: 650 mg via ORAL
  Filled 2014-10-29: qty 20.3

## 2014-10-29 MED ORDER — MIDAZOLAM HCL 5 MG/5ML IJ SOLN
INTRAMUSCULAR | Status: DC | PRN
  Administered 2014-10-29: 2 mg via INTRAVENOUS

## 2014-10-29 MED ORDER — SENNOSIDES-DOCUSATE SODIUM 8.6-50 MG PO TABS: 1.0000 | ORAL_TABLET | Freq: Two times a day (BID) | ORAL | Status: DC

## 2014-10-29 MED ORDER — ONDANSETRON HCL 4 MG/2ML IJ SOLN
INTRAMUSCULAR | Status: DC | PRN
Start: 2014-10-29 — End: 2014-10-29
  Administered 2014-10-29: 4 mg via INTRAVENOUS

## 2014-10-29 MED ORDER — LIDOCAINE HCL (CARDIAC) 20 MG/ML IV SOLN
INTRAVENOUS | Status: DC | PRN
  Administered 2014-10-29: 60 mg via INTRAVENOUS

## 2014-10-29 MED ORDER — KETOROLAC TROMETHAMINE 30 MG/ML IJ SOLN
INTRAMUSCULAR | Status: DC | PRN
  Administered 2014-10-29: 30 mg via INTRAVENOUS

## 2014-10-29 SURGICAL SUPPLY — 20 items
BAG DRAIN URO-CYSTO SKYTR STRL (DRAIN) ×4 IMPLANT
CANISTER SUCT LVC 12 LTR MEDI- (MISCELLANEOUS) ×4 IMPLANT
CATH INTERMIT  6FR 70CM (CATHETERS) ×4 IMPLANT
CLOTH BEACON ORANGE TIMEOUT ST (SAFETY) ×4 IMPLANT
FIBER LASER FLEXIVA 200 (UROLOGICAL SUPPLIES) IMPLANT
FIBER LASER FLEXIVA 365 (UROLOGICAL SUPPLIES) IMPLANT
GLOVE BIO SURGEON STRL SZ 6.5 (GLOVE) ×3 IMPLANT
GLOVE BIO SURGEON STRL SZ7.5 (GLOVE) ×4 IMPLANT
GLOVE BIO SURGEONS STRL SZ 6.5 (GLOVE) ×1
GLOVE BIOGEL PI IND STRL 6.5 (GLOVE) ×2 IMPLANT
GLOVE BIOGEL PI INDICATOR 6.5 (GLOVE) ×2
GOWN STRL REUS W/ TWL LRG LVL3 (GOWN DISPOSABLE) ×2 IMPLANT
GOWN STRL REUS W/TWL LRG LVL3 (GOWN DISPOSABLE) ×2
GOWN STRL REUS W/TWL XL LVL3 (GOWN DISPOSABLE) ×4 IMPLANT
GUIDEWIRE ANG ZIPWIRE 038X150 (WIRE) ×4 IMPLANT
GUIDEWIRE STR DUAL SENSOR (WIRE) ×4 IMPLANT
IV NS IRRIG 3000ML ARTHROMATIC (IV SOLUTION) ×8 IMPLANT
PACK CYSTO (CUSTOM PROCEDURE TRAY) ×4 IMPLANT
STENT POLARIS 5FRX22 (STENTS) ×4 IMPLANT
SYRINGE 10CC LL (SYRINGE) ×4 IMPLANT

## 2014-10-29 NOTE — Anesthesia Procedure Notes (Signed)
Procedure Name: LMA Insertion Date/Time: 10/29/2014 1:56 PM Performed by: Norva PavlovALLAWAY, Amyrie Illingworth G Pre-anesthesia Checklist: Patient identified, Emergency Drugs available, Suction available and Patient being monitored Patient Re-evaluated:Patient Re-evaluated prior to inductionOxygen Delivery Method: Circle System Utilized Preoxygenation: Pre-oxygenation with 100% oxygen Intubation Type: IV induction Ventilation: Mask ventilation without difficulty LMA: LMA inserted LMA Size: 4.0 Number of attempts: 1 Airway Equipment and Method: bite block Placement Confirmation: positive ETCO2 Tube secured with: Tape Dental Injury: Teeth and Oropharynx as per pre-operative assessment

## 2014-10-29 NOTE — Transfer of Care (Signed)
Last Vitals:  Filed Vitals:   10/29/14 1430  BP: 142/80  Pulse: 98  Temp: 36.3 C  Resp: 12   Immediate Anesthesia Transfer of Care Note  Patient: Olivia Kelly  Procedure(s) Performed: Procedure(s) (LRB): CYSTOSCOPY WITH LEFT RETROGRADE PYELOGRAM,  DIAGNOSTIC LEFT  URETEROSCOPY AND STENT PLACEMENT (Left)  Patient Location: PACU  Anesthesia Type: General  Level of Consciousness: awake, alert  and oriented  Airway & Oxygen Therapy: Patient Spontanous Breathing and Patient connected to face mask oxygen  Post-op Assessment: Report given to PACU RN and Post -op Vital signs reviewed and stable  Post vital signs: Reviewed and stable  Complications: No apparent anesthesia complications

## 2014-10-29 NOTE — Anesthesia Postprocedure Evaluation (Signed)
  Anesthesia Post-op Note  Patient: Olivia Kelly  Procedure(s) Performed: Procedure(s): CYSTOSCOPY WITH LEFT RETROGRADE PYELOGRAM,  DIAGNOSTIC LEFT  URETEROSCOPY AND STENT PLACEMENT (Left)  Patient Location: PACU  Anesthesia Type:General  Level of Consciousness: awake, alert , oriented and patient cooperative  Airway and Oxygen Therapy: Patient Spontanous Breathing  Post-op Pain: none  Post-op Assessment: Post-op Vital signs reviewed, Patient's Cardiovascular Status Stable, Respiratory Function Stable, Patent Airway, No signs of Nausea or vomiting and Pain level controlled  Post-op Vital Signs: Reviewed and stable  Last Vitals:  Filed Vitals:   10/29/14 1500  BP:   Pulse: 67  Temp:   Resp:     Complications: No apparent anesthesia complications

## 2014-10-29 NOTE — Brief Op Note (Signed)
10/29/2014  2:17 PM  PATIENT:  Olivia Kelly  29 y.o. female  PRE-OPERATIVE DIAGNOSIS:  LEFT URETEROPELVIC JUNCTION OBSTRUCTION  POST-OPERATIVE DIAGNOSIS:  LEFT URETEROPELVIC JUNCTION OBSTRUCTION  PROCEDURE:  Procedure(s): CYSTOSCOPY WITH LEFT RETROGRADE PYELOGRAM,  DIAGNOSTIC LEFT  URETEROSCOPY AND STENT PLACEMENT (Left)  SURGEON:  Surgeon(s) and Role:    * Sebastian Acheheodore Alixandria Friedt, MD - Primary  PHYSICIAN ASSISTANT:   ASSISTANTS: none   ANESTHESIA:   general  EBL:     BLOOD ADMINISTERED:none  DRAINS: none   LOCAL MEDICATIONS USED:  NONE  SPECIMEN:  No Specimen  DISPOSITION OF SPECIMEN:  N/A  COUNTS:  YES  TOURNIQUET:  * No tourniquets in log *  DICTATION: .Other Dictation: Dictation Number [id number]  PLAN OF CARE: Discharge to home after PACU  PATIENT DISPOSITION:  PACU - hemodynamically stable.   Delay start of Pharmacological VTE agent (>24hrs) due to surgical blood loss or risk of bleeding: yes

## 2014-10-29 NOTE — Anesthesia Preprocedure Evaluation (Addendum)
Anesthesia Evaluation  Patient identified by MRN, date of birth, ID band Patient awake    Reviewed: Allergy & Precautions, H&P , Patient's Chart, lab work & pertinent test results, reviewed documented beta blocker date and time   Airway Mallampati: II  TM Distance: >3 FB Neck ROM: full    Dental no notable dental hx.    Pulmonary  breath sounds clear to auscultation  Pulmonary exam normal       Cardiovascular Rhythm:regular Rate:Normal     Neuro/Psych Seizures -,     GI/Hepatic   Endo/Other    Renal/GU      Musculoskeletal   Abdominal   Peds  Hematology   Anesthesia Other Findings   Reproductive/Obstetrics                             Anesthesia Physical Anesthesia Plan  ASA: II  Anesthesia Plan:    Post-op Pain Management:    Induction: Intravenous  Airway Management Planned: LMA  Additional Equipment:   Intra-op Plan:   Post-operative Plan:   Informed Consent: I have reviewed the patients History and Physical, chart, labs and discussed the procedure including the risks, benefits and alternatives for the proposed anesthesia with the patient or authorized representative who has indicated his/her understanding and acceptance.   Dental Advisory Given and Dental advisory given  Plan Discussed with: CRNA and Surgeon  Anesthesia Plan Comments: (Discussed GA with LMA, possible sore throat, potential need to switch to ETT, N/V, pulmonary aspiration. Questions answered. )        Anesthesia Quick Evaluation

## 2014-10-29 NOTE — H&P (Signed)
Olivia Kelly is an 29 y.o. female.    Chief Complaint: Pre-Op Left Diagnostic Ureteroscopy / Possible Stone removal + stent placement  HPI:   1 - Recurrent UTI - pt with recurrent episodes cystitis with irritative voiding symptoms for at least 5 years, usually about once per year. UCX consistently e. coli res to amp but sens to keflex, cipro, bactrim, gent. CT 2016 with severe left hydro c/w likely UPJ obstruction. PVR 2016 "130mL". She does report some temporal association of seizures around episodes but certainly not always.  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 Appears 2 artery (1 smaller lower pole, very close aortic origin) 1 vein renovascular anatomy. Transition point unclear by CT 09/2014 but suspect upper ureter / UPJ as no ureteral dilation on imaging. Cr 1.0 2016. Denies recurrent flank pain, Dietel's crises. Renogram 10/2014 confirms left obstruction but some preserved function with  Lt T1/2 30min 43% function v. Rt T1/2 4.535min / 57% function.   3 - Nephrolithiasis - incidetnal 2mm left renal pelvis stone (does NOT appear cause of left obstruction) by CT 09/2014. No contralaeral stones or colic episodes.  PMH sig for seizure disorder (topomax, [facility] No CV disease or blood thinners.   Today " Olivia Kelly " is seen to proceed with left ureteroscopy for diagnostic intent to help dilineate internal UPJ anatomy prior to pyeloplasty and possibly address small rt renal stone. No interval fevers. Most recent UA withtou infectious parameters.   Past Medical History  Diagnosis Date  . Seizures     since childhood-last seizure 09/20/14    History reviewed. No pertinent past surgical history.  Family History  Problem Relation Age of Onset  . Diabetes Mother   . Hypertension Mother   . Diabetes Father   . Cancer Other    Social  History:  reports that she has never smoked. She does not have any smokeless tobacco history on file. She reports that she drinks alcohol. She reports that she does not use illicit drugs.  Allergies: No Known Allergies  No prescriptions prior to admission    No results found for this or any previous visit (from the past 48 hour(s)). No results found.  Review of Systems  Constitutional: Negative.   HENT: Negative.   Eyes: Negative.   Cardiovascular: Negative.   Gastrointestinal: Negative for nausea and vomiting.  Genitourinary: Negative.   Musculoskeletal: Negative.   Skin: Negative.   Neurological: Negative.   Endo/Heme/Allergies: Negative.   Psychiatric/Behavioral: Negative.     Height 5\' 2"  (1.575 m), weight 58.968 kg (130 lb), last menstrual period 10/22/2014. Physical Exam  Constitutional: She appears well-developed.  HENT:  Head: Normocephalic.  Eyes: Pupils are equal, round, and reactive to light.  Neck: Normal range of motion.  Cardiovascular: Normal rate.   Respiratory: Effort normal.  GI: Soft.  Genitourinary: Vagina normal.  Musculoskeletal: Normal range of motion.  Neurological: She is alert.  Skin: Skin is warm.     Assessment/Plan  1 - Recurrent UTI - left UPJ obstructin likely contributer as per above.   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 She is clearly obstructed by renogram on the left but does have meaningful preserved function. Rec diagnostic ureteroscopy / possible stone extraction to dilineate internal anatomy (help verify in  intrinsic v. extensic and r/o ectopic ureter) and then set up for likely pyeloplasty if favorable.   We rediscussed ureteroscopy (diagnostic) with possible stone manipulation with basketing and laser-lithotripsy in detail.  We rediscussed risks including bleeding, infection, damage to kidney / ureter  bladder, rarely loss of kidney. We  rediscussed anesthetic risks and rare but serious surgical complications including DVT, PE, MI, and mortality. We specifically readdressed that in 5-10% of cases a staged approach is required with stenting followed by re-attempt ureteroscopy if anatomy unfavorable.   The patient voiced understanding and wises to proceed today as planned.   3 - Nephrolithiasis - appears "secondary" stone due to chronic left upper tract urinary stasis, non-obstructing, Will attempt removal at ureteroscopy v. pyeloplasty penidng intertnal anatomy.    Kathye Cipriani 10/29/2014, 6:50 AM

## 2014-10-29 NOTE — Discharge Instructions (Signed)
1 - You may have urinary urgency (bladder spasms) and bloody urine on / off with stent in place. This is normal.  2 - Call MD or go to ER for fever >102, severe pain / nausea / vomiting not relieved by medications, or acute change in medical status  3 - Remove tethered stent on Monday morning at home by pulling on string, then blue/white plastic tubing, and discarding. Dr. Berneice HeinrichManny is is office that day if any problems arise.  Post Anesthesia Home Care Instructions  Activity: Get plenty of rest for the remainder of the day. A responsible adult should stay with you for 24 hours following the procedure.  For the next 24 hours, DO NOT: -Drive a car -Advertising copywriterperate machinery -Drink alcoholic beverages -Take any medication unless instructed by your physician -Make any legal decisions or sign important papers.  Meals: Start with liquid foods such as gelatin or soup. Progress to regular foods as tolerated. Avoid greasy, spicy, heavy foods. If nausea and/or vomiting occur, drink only clear liquids until the nausea and/or vomiting subsides. Call your physician if vomiting continues.  Special Instructions/Symptoms: Your throat may feel dry or sore from the anesthesia or the breathing tube placed in your throat during surgery. If this causes discomfort, gargle with warm salt water. The discomfort should disappear within 24 hours. [facility] [phone] Post Ureteroscopy With or Without Stent Instructions  Definitions:  Ureter: The duct that transports urine from the kidney to the bladder. Stent:   A plastic hollow tube that is placed into the ureter, from the kidney to the                 bladder to prevent the ureter from swelling shut.  GENERAL INSTRUCTIONS:  Despite the fact that no skin incisions were used, the area around the ureter and bladder is raw and irritated. The stent is a foreign body which will further irritate the bladder wall. This irritation is manifested by increased  frequency of urination, both day and night, and by an increase in the urge to urinate. In some, the urge to urinate is present almost always. Sometimes the urge is strong enough that you may not be able to stop yourself from urinating. The only real cure is to remove the stent and then give time for the bladder wall to heal which can't be done until the danger of the ureter swelling shut has passed, which varies.  You may see some blood in your urine while the stent is in place and a few days afterwards. Do not be alarmed, even if the urine was clear for a while. Get off your feet and drink lots of fluids until clearing occurs. If you start to pass clots or don't improve, call us.  DIET: You may return to your normal diet immediately. Because of the raw surface of your bladder, alcohol, spicy foods, acid type foods and drinks with caffeine may cause irritation or frequency and should be used in moderation. To keep your urine flowing freely and to avoid constipation, drink plenty of fluids during the day ( 8-10 glasses ). Tip: Avoid cranberry juice because it is very acidic.  ACTIVITY: Your physical activity doesn't need to be restricted. However, if you are very active, you may see some blood in your urine. We suggest that you reduce your activity under these circumstances until the bleeding has stopped.  BOWELS: It is important to keep your bowels regular during the postoperative period. Straining with bowel movements can  cause bleeding. A bowel movement every other day is reasonable. Use a mild laxative if needed, such as Milk of Magnesia 2-3 tablespoons, or 2 Dulcolax tablets. Call if you continue to have problems. If you have been taking narcotics for pain, before, during or after your surgery, you may be constipated. Take a laxative if necessary. ° ° °MEDICATION: °You should resume your pre-surgery medications unless told not to. In addition you will often be given an antibiotic to prevent  infection. These should be taken as prescribed until the bottles are finished unless you are having an unusual reaction to one of the drugs. ° °PROBLEMS YOU SHOULD REPORT TO US: °· Fevers over 100.5 Fahrenheit. °· Heavy bleeding, or clots ( See above notes about blood in urine ). °· Inability to urinate. °· Drug reactions ( hives, rash, nausea, vomiting, diarrhea ). °· Severe burning or pain with urination that is not improving. ° °FOLLOW-UP: °You will need a follow-up appointment to monitor your progress. Call for this appointment at the number listed above. Usually the first appointment will be about three to fourteen days after your surgery. ° ° ° ° ° °

## 2014-10-30 ENCOUNTER — Encounter (HOSPITAL_BASED_OUTPATIENT_CLINIC_OR_DEPARTMENT_OTHER): Payer: Self-pay | Admitting: Urology

## 2014-10-30 NOTE — Op Note (Signed)
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  ESTIMATED BLOOD LOSS:  Nil.  COMPLICATIONS:  None.  PROCEDURE: 1. Cystoscopy with left retrograde pyelogram interpretation. 2. Left diagnostic ureteroscopy. 3. Insertion of left ureteral stent 5 x 22 Polaris with tether to the     left thigh.  FINDINGS: 1. Unremarkable urinary bladder. 2. 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 3. No evidence of intraluminal stricture or  pulsations of the area of     UPJ again most consistent with ectopic insertion of left ureter.  INDICATIONS:  [name] is a very pleasant 29 year old young woman with a history of seizure disorder and recurrent urinary tract infections.  She was found on workup of recurrent infection to have a very large hydronephrosis without ureteral nephrosis.  Nuclear medicine renogram revealed preserved left renal function of 43% but significant obstruction with t1/2 greater than 30 minutes.  I felt the patient will most likely benefit from a pyeloplasty, however, the etiology of the UPJ obstruction remains elusive.  She had no obvious crossing vessel on actual imaging.  It is unclear whether her UPJ obstruction represents intraluminal mass or stricture or ectopic insertion or crossing vessel and as  this significantly impacts the type of pyeloplasty to be performed so that a diagnostic procedure with cystoscopy left retrograde and left diagnostic ureteroscopy is clearly indicated before pyeloplasty, and she wished to proceed.  Informed consent was obtained and placed in medical record.  DESCRIPTION OF PROCEDURE:  The patient being Olivia Kelly verified. Procedure being cysto left retrograde left diagnostic ureteroscopy was confirmed.  Procedure was carried out.  Time-out was performed. Intravenous antibiotics were administered.  General LMA anesthesia was introduced.  The patient was placed into a low lithotomy position. Sterile field was created by prepping and draping the patient's vagina, introitus, and proximal thighs using iodine x3.  Next, cystourethroscopy was performed using a 22-French rigid cystoscope with 30-degree offset lens.  Inspection of bladder revealed no diverticula, calcifications, or papular lesions.  Ureteral orifices were singleton bilaterally and normal anatomic position.  The left ureteral orifice cannulated with a 6- [place] short catheter and left retrograde pyelogram was obtained.  Left retrograde pyelogram demonstrated single left ureter, single system left kidney.  There is massive hydronephrosis without ureteral nephrosis.  There was apparent ectopic insertion of the ureter, inserting high and laterally on the renal pelvis.  There were no obvious intraluminal lesions.  A 0.038 zip wire was advanced at the level of the upper pole and set aside as a safety wire.  Next, semi-rigid ureteroscopy was  performed of the distal 1/4 of the left ureter alongside a separate Sensor working wire.  No mucosal abnormalities were found.  The semi-rigid scope was exchanged for the 8-French single channel digital ureteroscope using fluoroscopic guidance at the level of the UPJ.  Direct endoscopic examination of the proximal ureter and UPJ area revealed no obvious  intrinsic stricture or mass.  There are no obvious pulsations or arterial pulsations of the area.  This was felt to be most likely consistent with only ectopic insertion at the origin of UPJ obstruction and not crossing vessel or intraluminal lesions.  Given the angulation of the UPJ, the ureteroscope could not be advanced into the true pelvis and it was felt that excessive attempts at this would not be warranted as we had achieved the goals of procedure today.  As such ureteroscope was removed under continuous vision.  No significant mucosal abnormalities were found.  Finally, a new 5 x 22 Polaris type stent was placed over the remaining safety wire using fluoroscopic guidance.  Good proximal and distal deployment were noted and the tether was fashioned to the inner portion of her thigh.  The procedure was terminated.  The patient tolerated the procedure well.  There were no immediate periprocedural complications.  The patient was taken to postanesthesia care unit in stable condition.          ______________________________ Sebastian Ache, MD     TM/MEDQ  D:  10/29/2014  T:  10/30/2014  Job:  [id number]

## 2014-11-05 ENCOUNTER — Encounter (HOSPITAL_BASED_OUTPATIENT_CLINIC_OR_DEPARTMENT_OTHER): Payer: Self-pay | Admitting: Urology

## 2014-12-08 ENCOUNTER — Encounter: Payer: Self-pay | Admitting: Obstetrics & Gynecology

## 2014-12-10 ENCOUNTER — Encounter: Payer: Self-pay | Admitting: *Deleted

## 2014-12-15 ENCOUNTER — Other Ambulatory Visit: Payer: Self-pay | Admitting: Urology

## 2014-12-17 ENCOUNTER — Emergency Department (HOSPITAL_COMMUNITY)
Admission: EM | Admit: 2014-12-17 | Discharge: 2014-12-18 | Disposition: A | Discharge status: Home / Self Care | Payer: Medicaid Other | Attending: Emergency Medicine | Admitting: Emergency Medicine

## 2014-12-17 ENCOUNTER — Encounter (HOSPITAL_COMMUNITY): Payer: Self-pay

## 2014-12-17 DIAGNOSIS — Z79899 Other long term (current) drug therapy: Secondary | ICD-10-CM | POA: Insufficient documentation

## 2014-12-17 DIAGNOSIS — G40909 Epilepsy, unspecified, not intractable, without status epilepticus: Secondary | ICD-10-CM | POA: Diagnosis not present

## 2014-12-17 DIAGNOSIS — R079 Chest pain, unspecified: Secondary | ICD-10-CM | POA: Diagnosis present

## 2014-12-17 DIAGNOSIS — N63 Unspecified lump in unspecified breast: Secondary | ICD-10-CM

## 2014-12-17 DIAGNOSIS — Z792 Long term (current) use of antibiotics: Secondary | ICD-10-CM | POA: Diagnosis not present

## 2014-12-17 NOTE — ED Notes (Signed)
Pt complains of central chest pain, pressure like, since yesterday, she also complains of having a lump at the top of her left breast that is sore to touch

## 2014-12-18 ENCOUNTER — Emergency Department (HOSPITAL_COMMUNITY): Payer: Medicaid Other

## 2014-12-18 LAB — CBC WITH DIFFERENTIAL/PLATELET
BASOS ABS: 0 10*3/uL (ref 0.0–0.1)
BASOS PCT: 0 % (ref 0–1)
EOS PCT: 2 % (ref 0–5)
Eosinophils Absolute: 0.1 10*3/uL (ref 0.0–0.7)
HCT: 37.1 % (ref 36.0–46.0)
Hemoglobin: 12.6 g/dL (ref 12.0–15.0)
LYMPHS ABS: 2.2 10*3/uL (ref 0.7–4.0)
LYMPHS PCT: 43 % (ref 12–46)
MCH: 29.3 pg (ref 26.0–34.0)
MCHC: 34 g/dL (ref 30.0–36.0)
MCV: 86.3 fL (ref 78.0–100.0)
Monocytes Absolute: 0.5 10*3/uL (ref 0.1–1.0)
Monocytes Relative: 10 % (ref 3–12)
NEUTROS ABS: 2.3 10*3/uL (ref 1.7–7.7)
NEUTROS PCT: 45 % (ref 43–77)
PLATELETS: 269 10*3/uL (ref 150–400)
RBC: 4.3 MIL/uL (ref 3.87–5.11)
RDW: 12.6 % (ref 11.5–15.5)
WBC: 5.1 10*3/uL (ref 4.0–10.5)

## 2014-12-18 LAB — I-STAT CHEM 8, ED
BUN: 11 mg/dL (ref 6–23)
CALCIUM ION: 1.28 mmol/L — AB (ref 1.12–1.23)
Chloride: 106 mmol/L (ref 96–112)
Creatinine, Ser: 0.8 mg/dL (ref 0.50–1.10)
Glucose, Bld: 96 mg/dL (ref 70–99)
HCT: 40 % (ref 36.0–46.0)
Hemoglobin: 13.6 g/dL (ref 12.0–15.0)
Potassium: 3.4 mmol/L — ABNORMAL LOW (ref 3.5–5.1)
SODIUM: 141 mmol/L (ref 135–145)
TCO2: 17 mmol/L (ref 0–100)

## 2014-12-18 LAB — I-STAT TROPONIN, ED: Troponin i, poc: 0 ng/mL (ref 0.00–0.08)

## 2014-12-18 NOTE — ED Notes (Signed)
Returned from xray

## 2014-12-18 NOTE — Discharge Instructions (Signed)
You have  been given information on the breast center.  Please call and make an appointment for mammogram to further evaluate the small mass in the left breast

## 2014-12-18 NOTE — ED Provider Notes (Signed)
CSN: [id number]     Arrival date & time 12/17/14  2255 History   First MD Initiated Contact with Patient 12/18/14 0028     Chief Complaint  Patient presents with  . Chest Pain     (Consider location/radiation/quality/duration/timing/severity/associated sxs/prior Treatment) HPI Comments: Patient noticed a mass in her left breast yesterday that is persistent.  Today she said the pain radiates to the mid sternal area with palpation.  There's been no redness, no injury to the breast.  No nipple discharge Denies any family history of breast cancer, fibrocystic breast disease, fever or trauma  Patient is a 29 y.o. female presenting with chest pain. The history is provided by the patient.  Chest Pain Pain location:  L chest Pain quality: aching and radiating   Pain radiates to the back: no   Pain severity:  Mild Onset quality:  Gradual Duration:  1 day Timing:  Constant Progression:  Unchanged Chronicity:  New Context: at rest   Context: no drug use, not eating, no intercourse, not lifting, no movement, not raising an arm and no stress   Relieved by:  None tried Worsened by:  Nothing tried Associated symptoms: no fever and no shortness of breath   Associated symptoms comment:  Left breast mass   Past Medical History  Diagnosis Date  . Seizures     since childhood-last seizure 09/20/14   Past Surgical History  Procedure Laterality Date  . Cystoscopy with retrograde pyelogram, ureteroscopy and stent placement Left 10/29/2014    Procedure: CYSTOSCOPY WITH LEFT RETROGRADE PYELOGRAM,  DIAGNOSTIC LEFT  URETEROSCOPY AND STENT PLACEMENT;  Surgeon: Sebastian Ache, MD;  Location: [facility];  Service: Urology;  Laterality: Left;   Family History  Problem Relation Age of Onset  . Diabetes Mother   . Hypertension Mother   . Diabetes Father   . Cancer Other    History  Substance Use Topics  . Smoking status: Never Smoker   . Smokeless tobacco: Not on file  . Alcohol  Use: Yes     Comment: social   OB History    No data available     Review of Systems  Constitutional: Negative for fever.  Respiratory: Negative for shortness of breath.   Cardiovascular: Positive for chest pain.  Skin: Negative for color change, rash and wound.  All other systems reviewed and are negative.     Allergies  Review of patient's allergies indicates no known allergies.  Home Medications   Prior to Admission medications   Medication Sig Start Date End Date Taking? Authorizing Provider  topiramate (TOPAMAX) 100 MG tablet Take 1 tablet (100 mg total) by mouth 2 (two) times daily. 09/20/14  Yes Doug Sou, MD  cephALEXin (KEFLEX) 500 MG capsule Take 1 capsule (500 mg total) by mouth 2 (two) times daily. X 6 days to prevent post-op infection Patient not taking: Reported on 12/17/2014 10/29/14   Sebastian Ache, MD  levonorgestrel (MIRENA) 20 MCG/24HR IUD 1 each by Intrauterine route once.    Historical Provider, MD  oxyCODONE-acetaminophen (ROXICET) 5-325 MG per tablet Take 1-2 tablets by mouth every 6 (six) hours as needed for moderate pain or severe pain. Post-operatively Patient not taking: Reported on 12/17/2014 10/29/14   Sebastian Ache, MD  senna-docusate (SENOKOT-S) 8.6-50 MG per tablet Take 1 tablet by mouth 2 (two) times daily. While taking pain meds to prevent constipation Patient not taking: Reported on 12/17/2014 10/29/14   Sebastian Ache, MD   LMP 12/03/2014 Physical Exam  Constitutional:  She appears well-developed and well-nourished.  HENT:  Head: Normocephalic.  Eyes: Pupils are equal, round, and reactive to light.  Cardiovascular: Normal rate.   Pulmonary/Chest: Effort normal. Left breast exhibits mass and tenderness. Left breast exhibits no nipple discharge and no skin change.    Abdominal: Soft.  Skin: Skin is warm. No rash noted. No erythema.  Nursing note and vitals reviewed.   ED Course  Procedures (including critical care time) Labs  Review Labs Reviewed  I-STAT CHEM 8, ED - Abnormal; Notable for the following:    Potassium 3.4 (*)    Calcium, Ion 1.28 (*)    All other components within normal limits  CBC WITH DIFFERENTIAL/PLATELET  I-STAT TROPOININ, ED    Imaging Review No results found.   EKG Interpretation   Date/Time:  Wednesday December 17 2014 23:06:13 EDT Ventricular Rate:  76 PR Interval:  146 QRS Duration: 75 QT Interval:  370 QTC Calculation: 416 R Axis:   -18 Text Interpretation:  Sinus rhythm Confirmed by [facility]  MD, Morene AntuAPRIL  ([id number]) on 12/17/2014 11:22:43 PM     patient has been given a referral to the breast center for mammogram for further investigation of small breast mass  MDM   Final diagnoses:  Breast mass in female         Earley FavorGail Lajada Janes, NP 12/18/14 0128  April Palumbo, MD 12/18/14 [id number]

## 2015-01-19 ENCOUNTER — Other Ambulatory Visit (HOSPITAL_COMMUNITY)
Admission: RE | Admit: 2015-01-19 | Discharge: 2015-01-19 | Disposition: A | Discharge status: Home / Self Care | Payer: Medicaid Other | Source: Ambulatory Visit | Attending: Obstetrics & Gynecology | Admitting: Obstetrics & Gynecology

## 2015-01-19 ENCOUNTER — Encounter: Payer: Self-pay | Admitting: Obstetrics & Gynecology

## 2015-01-19 ENCOUNTER — Ambulatory Visit (INDEPENDENT_AMBULATORY_CARE_PROVIDER_SITE_OTHER): Payer: Medicaid Other | Admitting: Obstetrics & Gynecology

## 2015-01-19 ENCOUNTER — Inpatient Hospital Stay (HOSPITAL_COMMUNITY): Admission: RE | Admit: 2015-01-19 | Payer: Medicaid Other | Source: Ambulatory Visit

## 2015-01-19 VITALS — BP 108/71 | HR 78 | Ht 62.5 in | Wt 125.5 lb

## 2015-01-19 DIAGNOSIS — N871 Moderate cervical dysplasia: Secondary | ICD-10-CM | POA: Diagnosis not present

## 2015-01-19 DIAGNOSIS — R87618 Other abnormal cytological findings on specimens from cervix uteri: Secondary | ICD-10-CM | POA: Diagnosis not present

## 2015-01-19 DIAGNOSIS — Z3202 Encounter for pregnancy test, result negative: Secondary | ICD-10-CM | POA: Diagnosis not present

## 2015-01-19 LAB — POCT PREGNANCY, URINE: PREG TEST UR: NEGATIVE

## 2015-01-19 NOTE — Patient Instructions (Signed)
Loop Electrosurgical Excision Procedure Care After Refer to this sheet in the next few weeks. These instructions provide you with information on caring for yourself after your procedure. Your caregiver may also give you more specific instructions. Your treatment has been planned according to current medical practices, but problems sometimes occur. Call your caregiver if you have any problems or questions after your procedure. HOME CARE INSTRUCTIONS   Do not use tampons, douche, or have sexual intercourse for 3 weeks or as directed by your caregiver.  Begin normal activities if you have no or minimal cramping or bleeding, unless directed otherwise by your caregiver.  Take your temperature if you feel sick. Write down your temperature on paper, and tell your caregiver if you have a fever.  Take all medicines as directed by your caregiver.  Keep all your follow-up appointments and Pap tests as directed by your caregiver. SEEK IMMEDIATE MEDICAL CARE IF:   You have bleeding that is heavier or longer than a normal menstrual cycle.  You have bleeding that is bright red.  You have blood clots.  You have a fever.  You have increasing cramps or pain not relieved by medicine.  You develop abdominal pain that does not seem to be related to the same area of earlier cramping and pain.  You are lightheaded, unusually weak, or faint.  You develop painful or bloody urination.  You develop a bad smelling vaginal discharge. MAKE SURE YOU:  Understand these instructions.  Will watch your condition.  Will get help right away if you are not doing well or get worse. Document Released: 05/05/2011 Document Revised: 11/14/2011 Document Reviewed: 05/05/2011 [facility] Patient Information 2015 [employer] [place]. This information is not intended to replace advice given to you by your health care provider. Make sure you discuss any questions you have with your health care provider.

## 2015-01-19 NOTE — Progress Notes (Signed)
   GYNECOLOGY CLINIC PROCEDURE NOTE  Olivia Kelly is a 29 y.o. No obstetric history on file. here for LEEP. No GYN concerns. Pap smear and colposcopy reviewed.    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  Role of HPV,cervical dysplasia and need for close followup was empasized.  Informed written consent was obtained. All questions were answered. Time out performed.  Procedure: The patient was placed in lithotomy position and the bivalved coated speculum was placed in the patient's vagina. A grounding pad placed on the patient. Lugol's solution was applied to the cervix and areas of decreased uptake were noted around the transformation zone.   Local anesthesia was administered via an intracervical block using 10 ml of 2% Lidocaine with epinephrine. The suction was turned on and the Medium 1X Fisher Cone Biopsy Excisor on 2760 Watts of cutting current was used to excise the area of decreased uptake and excise the entire transformation zone. Excellent hemostasis was achieved using roller ball coagulation set at 50 Watts coagulation current. Monsel's solution was then applied and the speculum was removed from the vagina. Specimens were sent to pathology.  The patient tolerated the procedure well. Post-operative instructions given to patient, including instruction to seek medical attention for persistent bright red bleeding, fever, abdominal/pelvic pain, dysuria, nausea or vomiting. She was also told about the possibility of having copious yellow to black tinged discharge for weeks. She was counseled to avoid anything in the vagina (sex/douching/tampons) for 3 weeks. She has a 4 week  post-operative check to assess wound healing, review results and discuss further management.   Olivia CollinsUGONNA  Alleta Avery, MD, FACOG Attending Obstetrician & Gynecologist Center for [facility], [place] [facility]

## 2015-01-21 NOTE — Patient Instructions (Addendum)
Olivia Kelly  01/21/2015   Your procedure is scheduled on: 01-23-2015  [facility]  [facility] and follow signs to               Short Stay Center at 10:45 AM.  Call this number if you have problems the morning of surgery [phone]   Remember: ONLY 1 PERSON MAY GO WITH YOU TO SHORT STAY TO GET  READY MORNING OF YOUR SURGERY.  Do not eat food or drink liquids :After Midnight.     Take these medicines the morning of surgery with A SIP OF WATER: Topomax                               You may not have any metal on your body including hair pins and              piercings  Do not wear jewelry, make-up, lotions, powders or perfumes, deodorant             Do not wear nail polish.  Do not shave  48 hours prior to surgery.     Do not bring valuables to the hospital. [facility] IS NOT             RESPONSIBLE   FOR VALUABLES.  Contacts, dentures or bridgework may not be worn into surgery.  Leave suitcase in the car. After surgery it may be brought to your room.   . :  Special Instructions: coughing and deep breathing exercises, leg exercises              Please read over the following fact sheets you were given: _____________________________________________________________________             [facility] - Pre[facility] Before surgery, you can play an important role.  Because skin is not sterile, your skin needs to be as free of germs as possible.  You can reduce the number of germs on your skin by washing with CHG (chlorahexidine gluconate) soap before surgery.  CHG is an antiseptic cleaner which kills germs and bonds with the skin to continue killing germs even after washing. Please DO NOT use if you have an allergy to CHG or antibacterial soaps.  If your skin becomes reddened/irritated stop using the CHG and inform your nurse when you arrive at Short Stay. Do not shave (including legs and underarms) for at least 48 hours prior to the  first CHG shower.  You may shave your face/neck. Please follow these instructions carefully:  1.  Shower with CHG Soap the night before surgery and the  morning of Surgery.  2.  If you choose to wash your hair, wash your hair first as usual with your  normal  shampoo.  3.  After you shampoo, rinse your hair and body thoroughly to remove the  shampoo.                           4.  Use CHG as you would any other liquid soap.  You can apply chg directly  to the skin and wash                       Gently with a scrungie or clean washcloth.  5.  Apply the CHG Soap to  your body ONLY FROM THE NECK DOWN.   Do not use on face/ open                           Wound or open sores. Avoid contact with eyes, ears mouth and genitals (private parts).                       Wash face,  Genitals (private parts) with your normal soap.             6.  Wash thoroughly, paying special attention to the area where your surgery  will be performed.  7.  Thoroughly rinse your body with warm water from the neck down.  8.  DO NOT shower/wash with your normal soap after using and rinsing off  the CHG Soap.                9.  Pat yourself dry with a clean towel.            10.  Wear clean pajamas.            11.  Place clean sheets on your bed the night of your first shower and do not  sleep with pets. Day of Surgery : Do not apply any lotions/deodorants the morning of surgery.  Please wear clean clothes to the hospital/surgery center.  FAILURE TO FOLLOW THESE INSTRUCTIONS MAY RESULT IN THE CANCELLATION OF YOUR SURGERY PATIENT SIGNATURE_________________________________  NURSE SIGNATURE__________________________________  ________________________________________________________________________  WHAT IS A BLOOD TRANSFUSION? Blood Transfusion Information  A transfusion is the replacement of blood or some of its parts. Blood is made up of multiple cells which provide different functions.  Red blood cells carry oxygen and are  used for blood loss replacement.  White blood cells fight against infection.  Platelets control bleeding.  Plasma helps clot blood.  Other blood products are available for specialized needs, such as hemophilia or other clotting disorders. BEFORE THE TRANSFUSION  Who gives blood for transfusions?   Healthy volunteers who are fully evaluated to make sure their blood is safe. This is blood bank blood. Transfusion therapy is the safest it has ever been in the practice of medicine. Before blood is taken from a donor, a complete history is taken to make sure that person has no history of diseases nor engages in risky social behavior (examples are intravenous drug use or sexual activity with multiple partners). The donor's travel history is screened to minimize risk of transmitting infections, such as malaria. The donated blood is tested for signs of infectious diseases, such as HIV and hepatitis. The blood is then tested to be sure it is compatible with you in order to minimize the chance of a transfusion reaction. If you or a relative donates blood, this is often done in anticipation of surgery and is not appropriate for emergency situations. It takes many days to process the donated blood. RISKS AND COMPLICATIONS Although transfusion therapy is very safe and saves many lives, the main dangers of transfusion include:   Getting an infectious disease.  Developing a transfusion reaction. This is an allergic reaction to something in the blood you were given. Every precaution is taken to prevent this. The decision to have a blood transfusion has been considered carefully by your caregiver before blood is given. Blood is not given unless the benefits outweigh the risks. AFTER THE TRANSFUSION  Right after receiving a blood transfusion, you will usually  feel much better and more energetic. This is especially true if your red blood cells have gotten low (anemic). The transfusion raises the level of the red  blood cells which carry oxygen, and this usually causes an energy increase.  The nurse administering the transfusion will monitor you carefully for complications. HOME CARE INSTRUCTIONS  No special instructions are needed after a transfusion. You may find your energy is better. Speak with your caregiver about any limitations on activity for underlying diseases you may have. SEEK MEDICAL CARE IF:   Your condition is not improving after your transfusion.  You develop redness or irritation at the intravenous (IV) site. SEEK IMMEDIATE MEDICAL CARE IF:  Any of the following symptoms occur over the next 12 hours:  Shaking chills.  You have a temperature by mouth above 102 F (38.9 C), not controlled by medicine.  Chest, back, or muscle pain.  People around you feel you are not acting correctly or are confused.  Shortness of breath or difficulty breathing.  Dizziness and fainting.  You get a rash or develop hives.  You have a decrease in urine output.  Your urine turns a dark color or changes to pink, red, or brown. Any of the following symptoms occur over the next 10 days:  You have a temperature by mouth above 102 F (38.9 C), not controlled by medicine.  Shortness of breath.  Weakness after normal activity.  The white part of the eye turns yellow (jaundice).  You have a decrease in the amount of urine or are urinating less often.  Your urine turns a dark color or changes to pink, red, or brown. Document Released: 08/19/2000 Document Revised: 11/14/2011 Document Reviewed: 04/07/2008 [facility] Patient Information 2014 [employer] [place].  _______________________________________________________________________

## 2015-01-22 ENCOUNTER — Telehealth: Payer: Self-pay

## 2015-01-22 ENCOUNTER — Encounter (HOSPITAL_COMMUNITY)
Admission: RE | Admit: 2015-01-22 | Discharge: 2015-01-22 | Disposition: A | Discharge status: Home / Self Care | Payer: Medicaid Other | Source: Ambulatory Visit | Attending: Urology | Admitting: Urology

## 2015-01-22 ENCOUNTER — Encounter (HOSPITAL_COMMUNITY): Payer: Self-pay

## 2015-01-22 LAB — BASIC METABOLIC PANEL
ANION GAP: 3 — AB (ref 5–15)
BUN: 12 mg/dL (ref 6–20)
CO2: 22 mmol/L (ref 22–32)
Calcium: 9.1 mg/dL (ref 8.9–10.3)
Chloride: 111 mmol/L (ref 101–111)
Creatinine, Ser: 0.95 mg/dL (ref 0.44–1.00)
GFR calc non Af Amer: >60 mL/min (ref >60)
Glucose, Bld: 99 mg/dL (ref 65–99)
POTASSIUM: 4.1 mmol/L (ref 3.5–5.1)
Sodium: 136 mmol/L (ref 135–145)

## 2015-01-22 LAB — CBC
HEMATOCRIT: 38.3 % (ref 36.0–46.0)
HEMOGLOBIN: 12.7 g/dL (ref 12.0–15.0)
MCH: 29 pg (ref 26.0–34.0)
MCHC: 33.2 g/dL (ref 30.0–36.0)
MCV: 87.4 fL (ref 78.0–100.0)
Platelets: 278 10*3/uL (ref 150–400)
RBC: 4.38 MIL/uL (ref 3.87–5.11)
RDW: 12.5 % (ref 11.5–15.5)
WBC: 3.3 10*3/uL — AB (ref 4.0–10.5)

## 2015-01-22 LAB — HCG, SERUM, QUALITATIVE: Preg, Serum: NEGATIVE

## 2015-01-22 MED ORDER — GENTAMICIN SULFATE 40 MG/ML IJ SOLN
5.0000 mg/kg | INTRAVENOUS | Status: AC
  Administered 2015-01-23: 290 mg via INTRAVENOUS
  Filled 2015-01-22 (×2): qty 7.25

## 2015-01-22 NOTE — Telephone Encounter (Signed)
Called patient and informed her of results and recommendations. Patient verbalized understanding and gratitude. No questions or concerns.  

## 2015-01-22 NOTE — Progress Notes (Signed)
12[facility]18[facility]14 [facility] in [facility]

## 2015-01-22 NOTE — Telephone Encounter (Signed)
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 Repeat pap and HPV test in one year. Please call to inform patient of results and recommendations. Problem list updated

## 2015-01-23 ENCOUNTER — Encounter (HOSPITAL_COMMUNITY)
Admission: RE | Disposition: A | Discharge status: Home / Self Care | Payer: Self-pay | Source: Ambulatory Visit | Attending: Urology

## 2015-01-23 ENCOUNTER — Inpatient Hospital Stay (HOSPITAL_COMMUNITY): Payer: Medicaid Other | Admitting: Registered Nurse

## 2015-01-23 ENCOUNTER — Inpatient Hospital Stay (HOSPITAL_COMMUNITY): Payer: Medicaid Other

## 2015-01-23 ENCOUNTER — Inpatient Hospital Stay (HOSPITAL_COMMUNITY)
Admission: RE | Admit: 2015-01-23 | Discharge: 2015-01-24 | DRG: 660 | Disposition: A | Discharge status: Home / Self Care | Payer: Medicaid Other | Source: Ambulatory Visit | Attending: Urology | Admitting: Urology

## 2015-01-23 ENCOUNTER — Encounter (HOSPITAL_COMMUNITY): Payer: Self-pay | Admitting: *Deleted

## 2015-01-23 DIAGNOSIS — G40909 Epilepsy, unspecified, not intractable, without status epilepticus: Secondary | ICD-10-CM | POA: Diagnosis not present

## 2015-01-23 DIAGNOSIS — Q6239 Other obstructive defects of renal pelvis and ureter: Secondary | ICD-10-CM

## 2015-01-23 DIAGNOSIS — Z87440 Personal history of urinary (tract) infections: Secondary | ICD-10-CM

## 2015-01-23 DIAGNOSIS — Z01812 Encounter for preprocedural laboratory examination: Secondary | ICD-10-CM | POA: Diagnosis not present

## 2015-01-23 DIAGNOSIS — Z87442 Personal history of urinary calculi: Secondary | ICD-10-CM | POA: Diagnosis not present

## 2015-01-23 DIAGNOSIS — N131 Hydronephrosis with ureteral stricture, not elsewhere classified: Secondary | ICD-10-CM | POA: Diagnosis present

## 2015-01-23 DIAGNOSIS — Q6211 Congenital occlusion of ureteropelvic junction: Secondary | ICD-10-CM

## 2015-01-23 DIAGNOSIS — Z8249 Family history of ischemic heart disease and other diseases of the circulatory system: Secondary | ICD-10-CM

## 2015-01-23 DIAGNOSIS — N1330 Unspecified hydronephrosis: Secondary | ICD-10-CM

## 2015-01-23 DIAGNOSIS — Z833 Family history of diabetes mellitus: Secondary | ICD-10-CM | POA: Diagnosis not present

## 2015-01-23 HISTORY — PX: CYSTOSCOPY WITH RETROGRADE PYELOGRAM, URETEROSCOPY AND STENT PLACEMENT: SHX5789

## 2015-01-23 HISTORY — PX: ROBOT ASSISTED PYELOPLASTY: SHX5143

## 2015-01-23 LAB — TYPE AND SCREEN
ABO/RH(D): B POS
ANTIBODY SCREEN: NEGATIVE

## 2015-01-23 SURGERY — ROBOTIC ASSISTED PYELOPLASTY WITH STENT PLACEMENT
Anesthesia: General | Site: Renal | Laterality: Left

## 2015-01-23 MED ORDER — MIDAZOLAM HCL 2 MG/2ML IJ SOLN
INTRAMUSCULAR | Status: AC
  Filled 2015-01-23: qty 2

## 2015-01-23 MED ORDER — HYDROCODONE-ACETAMINOPHEN 5-325 MG PO TABS: 1.0000 | ORAL_TABLET | Freq: Four times a day (QID) | ORAL | Status: DC | PRN

## 2015-01-23 MED ORDER — PROPOFOL 10 MG/ML IV BOLUS
INTRAVENOUS | Status: DC | PRN
  Administered 2015-01-23: 200 mg via INTRAVENOUS

## 2015-01-23 MED ORDER — CEFAZOLIN SODIUM-DEXTROSE 2-3 GM-% IV SOLR
2.0000 g | INTRAVENOUS | Status: AC
  Administered 2015-01-23: 2 g via INTRAVENOUS

## 2015-01-23 MED ORDER — FENTANYL CITRATE (PF) 250 MCG/5ML IJ SOLN
INTRAMUSCULAR | Status: AC
  Filled 2015-01-23: qty 5

## 2015-01-23 MED ORDER — PROPOFOL 10 MG/ML IV BOLUS
INTRAVENOUS | Status: AC
  Filled 2015-01-23: qty 20

## 2015-01-23 MED ORDER — SODIUM CHLORIDE 0.9 % IJ SOLN
INTRAMUSCULAR | Status: DC | PRN
  Administered 2015-01-23: 20 mL

## 2015-01-23 MED ORDER — HYDROMORPHONE HCL 1 MG/ML IJ SOLN: 0.2500 mg | INTRAMUSCULAR | Status: DC | PRN

## 2015-01-23 MED ORDER — MIDAZOLAM HCL 5 MG/5ML IJ SOLN
INTRAMUSCULAR | Status: DC | PRN
  Administered 2015-01-23: 2 mg via INTRAVENOUS

## 2015-01-23 MED ORDER — KETOROLAC TROMETHAMINE 0.5 % OP SOLN
1.0000 [drp] | Freq: Once | OPHTHALMIC | Status: AC
  Administered 2015-01-24: 1 [drp] via OPHTHALMIC
  Filled 2015-01-23: qty 3

## 2015-01-23 MED ORDER — ACETAMINOPHEN 10 MG/ML IV SOLN
1000.0000 mg | Freq: Once | INTRAVENOUS | Status: AC
  Administered 2015-01-23: 1000 mg via INTRAVENOUS
  Filled 2015-01-23: qty 100

## 2015-01-23 MED ORDER — PROMETHAZINE HCL 25 MG/ML IJ SOLN: 6.2500 mg | INTRAMUSCULAR | Status: DC | PRN

## 2015-01-23 MED ORDER — DEXAMETHASONE SODIUM PHOSPHATE 10 MG/ML IJ SOLN
INTRAMUSCULAR | Status: AC
  Filled 2015-01-23: qty 1

## 2015-01-23 MED ORDER — LACTATED RINGERS IR SOLN
Status: DC | PRN
  Administered 2015-01-23: 1

## 2015-01-23 MED ORDER — ROCURONIUM BROMIDE 100 MG/10ML IV SOLN
INTRAVENOUS | Status: DC | PRN
  Administered 2015-01-23: 40 mg via INTRAVENOUS

## 2015-01-23 MED ORDER — PROMETHAZINE HCL 25 MG/ML IJ SOLN
6.2500 mg | INTRAMUSCULAR | Status: DC | PRN
  Administered 2015-01-23: 6.25 mg via INTRAVENOUS

## 2015-01-23 MED ORDER — CEFAZOLIN SODIUM-DEXTROSE 2-3 GM-% IV SOLR
INTRAVENOUS | Status: AC
  Filled 2015-01-23: qty 50

## 2015-01-23 MED ORDER — FENTANYL CITRATE (PF) 100 MCG/2ML IJ SOLN
INTRAMUSCULAR | Status: DC | PRN
  Administered 2015-01-23 (×2): 100 ug via INTRAVENOUS
  Administered 2015-01-23 (×2): 50 ug via INTRAVENOUS

## 2015-01-23 MED ORDER — ONDANSETRON HCL 4 MG/2ML IJ SOLN
INTRAMUSCULAR | Status: DC | PRN
  Administered 2015-01-23: 4 mg via INTRAVENOUS

## 2015-01-23 MED ORDER — NEOSTIGMINE METHYLSULFATE 10 MG/10ML IV SOLN
INTRAVENOUS | Status: DC | PRN
  Administered 2015-01-23: 3 mg via INTRAVENOUS

## 2015-01-23 MED ORDER — GLYCOPYRROLATE 0.2 MG/ML IJ SOLN
INTRAMUSCULAR | Status: DC | PRN
  Administered 2015-01-23: 0.4 mg via INTRAVENOUS

## 2015-01-23 MED ORDER — IOHEXOL 300 MG/ML  SOLN
INTRAMUSCULAR | Status: DC | PRN
  Administered 2015-01-23: 10 mL via INTRAVENOUS

## 2015-01-23 MED ORDER — SODIUM CHLORIDE 0.9 % IJ SOLN
INTRAMUSCULAR | Status: AC
  Filled 2015-01-23: qty 20

## 2015-01-23 MED ORDER — ONDANSETRON HCL 4 MG/2ML IJ SOLN
INTRAMUSCULAR | Status: AC
  Filled 2015-01-23: qty 2

## 2015-01-23 MED ORDER — HYDROMORPHONE HCL 1 MG/ML IJ SOLN
INTRAMUSCULAR | Status: AC
  Filled 2015-01-23: qty 1

## 2015-01-23 MED ORDER — HYDROMORPHONE HCL 1 MG/ML IJ SOLN
0.2500 mg | INTRAMUSCULAR | Status: DC | PRN
  Administered 2015-01-23 (×2): 0.25 mg via INTRAVENOUS

## 2015-01-23 MED ORDER — WATER FOR IRRIGATION, STERILE IR SOLN
Status: DC | PRN
  Administered 2015-01-23: 3000 mL

## 2015-01-23 MED ORDER — BUPIVACAINE LIPOSOME 1.3 % IJ SUSP
20.0000 mL | Freq: Once | INTRAMUSCULAR | Status: AC
  Administered 2015-01-23: 20 mL
  Filled 2015-01-23 (×2): qty 20

## 2015-01-23 MED ORDER — PROMETHAZINE HCL 25 MG/ML IJ SOLN
INTRAMUSCULAR | Status: AC
Start: 2015-01-23 — End: 2015-01-24
  Filled 2015-01-23: qty 1

## 2015-01-23 MED ORDER — LIDOCAINE HCL (CARDIAC) 20 MG/ML IV SOLN
INTRAVENOUS | Status: AC
  Filled 2015-01-23: qty 5

## 2015-01-23 MED ORDER — ROCURONIUM BROMIDE 100 MG/10ML IV SOLN
INTRAVENOUS | Status: AC
  Filled 2015-01-23: qty 1

## 2015-01-23 MED ORDER — GLYCOPYRROLATE 0.2 MG/ML IJ SOLN
INTRAMUSCULAR | Status: AC
  Filled 2015-01-23: qty 1

## 2015-01-23 MED ORDER — DEXAMETHASONE SODIUM PHOSPHATE 10 MG/ML IJ SOLN
INTRAMUSCULAR | Status: DC | PRN
  Administered 2015-01-23: 10 mg via INTRAVENOUS

## 2015-01-23 MED ORDER — KETOROLAC TROMETHAMINE 0.5 % OP SOLN
2.0000 [drp] | Freq: Once | OPHTHALMIC | Status: AC
  Administered 2015-01-23: 2 [drp] via OPHTHALMIC
  Filled 2015-01-23: qty 3

## 2015-01-23 MED ORDER — LIDOCAINE HCL (CARDIAC) 20 MG/ML IV SOLN
INTRAVENOUS | Status: DC | PRN
  Administered 2015-01-23: 75 mg via INTRAVENOUS
  Administered 2015-01-23: 10 mg via INTRAVENOUS
  Administered 2015-01-23: 25 mg via INTRATRACHEAL

## 2015-01-23 MED ORDER — LACTATED RINGERS IV SOLN
INTRAVENOUS | Status: DC
  Administered 2015-01-23: 1000 mL via INTRAVENOUS
  Administered 2015-01-23: 16:00:00 via INTRAVENOUS

## 2015-01-23 SURGICAL SUPPLY — 82 items
BAG URO CATCHER STRL LF (DRAPE) ×3 IMPLANT
BASKET LASER NITINOL 1.9FR (BASKET) IMPLANT
BASKET STNLS GEMINI 4WIRE 3FR (BASKET) IMPLANT
BASKET ZERO TIP NITINOL 2.4FR (BASKET) IMPLANT
CANISTER OMNI JUG 16 LITER (MISCELLANEOUS) ×3 IMPLANT
CANNULA SEAL DVNC (CANNULA) ×1 IMPLANT
CANNULA SEALS DA VINCI (CANNULA) ×2
CATH INTERMIT  6FR 70CM (CATHETERS) ×3 IMPLANT
CHLORAPREP W/TINT 26ML (MISCELLANEOUS) ×3 IMPLANT
CLIP LIGATING HEM O LOK PURPLE (MISCELLANEOUS) IMPLANT
CLIP LIGATING HEMO O LOK GREEN (MISCELLANEOUS) IMPLANT
CLOTH BEACON ORANGE TIMEOUT ST (SAFETY) ×3 IMPLANT
CORD HIGH FREQUENCY UNIPOLAR (ELECTROSURGICAL) ×3 IMPLANT
CORDS BIPOLAR (ELECTRODE) ×3 IMPLANT
COVER TIP SHEARS 8 DVNC (MISCELLANEOUS) ×1 IMPLANT
COVER TIP SHEARS 8MM DA VINCI (MISCELLANEOUS) ×2
DECANTER SPIKE VIAL GLASS SM (MISCELLANEOUS) ×3 IMPLANT
DRAIN CHANNEL 15F RND FF 3/16 (WOUND CARE) ×3 IMPLANT
DRAPE INCISE IOBAN 66X45 STRL (DRAPES) ×3 IMPLANT
DRAPE LAPAROSCOPIC ABDOMINAL (DRAPES) ×3 IMPLANT
DRAPE SHEET LG 3/4 BI-LAMINATE (DRAPES) ×3 IMPLANT
DRAPE TABLE BACK 44X90 PK DISP (DRAPES) ×3 IMPLANT
DRAPE UTILITY XL STRL (DRAPES) ×3 IMPLANT
DRAPE WARM FLUID 44X44 (DRAPE) ×3 IMPLANT
ELECT REM PT RETURN 9FT ADLT (ELECTROSURGICAL) ×3
ELECTRODE REM PT RTRN 9FT ADLT (ELECTROSURGICAL) ×1 IMPLANT
EVACUATOR SILICONE 100CC (DRAIN) ×3 IMPLANT
FIBER LASER FLEXIVA 1000 (UROLOGICAL SUPPLIES) IMPLANT
FIBER LASER FLEXIVA 200 (UROLOGICAL SUPPLIES) IMPLANT
FIBER LASER FLEXIVA 365 (UROLOGICAL SUPPLIES) IMPLANT
FIBER LASER FLEXIVA 550 (UROLOGICAL SUPPLIES) IMPLANT
FIBER LASER TRAC TIP (UROLOGICAL SUPPLIES) IMPLANT
GLOVE BIOGEL M STRL SZ7.5 (GLOVE) ×9 IMPLANT
GOWN STRL REUS W/TWL LRG LVL3 (GOWN DISPOSABLE) ×6 IMPLANT
GOWN STRL REUS W/TWL XL LVL3 (GOWN DISPOSABLE) ×6 IMPLANT
GUIDEWIRE ANG ZIPWIRE 038X150 (WIRE) ×3 IMPLANT
GUIDEWIRE STR DUAL SENSOR (WIRE) ×3 IMPLANT
IV NS IRRIG 3000ML ARTHROMATIC (IV SOLUTION) ×3 IMPLANT
KIT ACCESSORY DA VINCI DISP (KITS) ×2
KIT ACCESSORY DVNC DISP (KITS) ×1 IMPLANT
KIT BASIN OR (CUSTOM PROCEDURE TRAY) ×3 IMPLANT
LIQUID BAND (GAUZE/BANDAGES/DRESSINGS) ×3 IMPLANT
NEEDLE INSUFFLATION 14GA 120MM (NEEDLE) ×3 IMPLANT
NS IRRIG 1000ML POUR BTL (IV SOLUTION) ×3 IMPLANT
PACK CYSTO (CUSTOM PROCEDURE TRAY) ×3 IMPLANT
PAD POSITIONING PINK XL (MISCELLANEOUS) IMPLANT
PENCIL BUTTON HOLSTER BLD 10FT (ELECTRODE) ×3 IMPLANT
POSITIONER SURGICAL ARM (MISCELLANEOUS) ×6 IMPLANT
SET TUBE IRRIG SUCTION NO TIP (IRRIGATION / IRRIGATOR) IMPLANT
SHEET LAVH (DRAPES) IMPLANT
SOLUTION ANTI FOG 6CC (MISCELLANEOUS) ×3 IMPLANT
SOLUTION ELECTROLUBE (MISCELLANEOUS) ×3 IMPLANT
SPONGE LAP 18X18 X RAY DECT (DISPOSABLE) ×3 IMPLANT
SPONGE LAP 4X18 X RAY DECT (DISPOSABLE) ×3 IMPLANT
STAPLER VISISTAT 35W (STAPLE) ×3 IMPLANT
STENT URET 6FRX24 CONTOUR (STENTS) ×3 IMPLANT
SURGILUBE 3G PEEL PACK STRL (MISCELLANEOUS) ×12 IMPLANT
SUT ETHILON 3 0 PS 1 (SUTURE) ×3 IMPLANT
SUT MNCRL 3 0 VIOLET RB1 (SUTURE) ×3 IMPLANT
SUT MNCRL AB 4-0 PS2 18 (SUTURE) ×6 IMPLANT
SUT MONOCRYL 3 0 RB1 (SUTURE) ×6
SUT VIC AB 0 CT1 27 (SUTURE) ×6
SUT VIC AB 0 CT1 27XBRD ANTBC (SUTURE) ×3 IMPLANT
SUT VIC AB 0 UR5 27 (SUTURE) ×6 IMPLANT
SUT VICRYL 0 UR6 27IN ABS (SUTURE) ×9 IMPLANT
SUT VLOC BARB 180 ABS3/0GR12 (SUTURE) ×6
SUTURE VLOC BRB 180 ABS3/0GR12 (SUTURE) ×2 IMPLANT
SYRINGE 10CC LL (SYRINGE) IMPLANT
SYRINGE IRR TOOMEY STRL 70CC (SYRINGE) IMPLANT
TOWEL OR NON WOVEN STRL DISP B (DISPOSABLE) ×3 IMPLANT
TRAY FOLEY W/METER SILVER 14FR (SET/KITS/TRAYS/PACK) ×3 IMPLANT
TRAY LAPAROSCOPIC (CUSTOM PROCEDURE TRAY) ×3 IMPLANT
TROCAR 12M 150ML BLUNT (TROCAR) ×3 IMPLANT
TROCAR BLADELESS OPT 12M 150M (ENDOMECHANICALS) ×3 IMPLANT
TROCAR BLADELESS OPT 5 100 (ENDOMECHANICALS) ×3 IMPLANT
TROCAR BLADELESS OPT 5 75 (ENDOMECHANICALS) ×3 IMPLANT
TROCAR XCEL 12X100 BLDLESS (ENDOMECHANICALS) ×3 IMPLANT
TUBE FEEDING 8FR 16IN STR KANG (MISCELLANEOUS) ×3 IMPLANT
TUBING CONNECTING 10 (TUBING) ×2 IMPLANT
TUBING CONNECTING 10' (TUBING) ×1
TUBING INSUFFLATION 10FT LAP (TUBING) ×3 IMPLANT
WATER STERILE IRR 1500ML POUR (IV SOLUTION) ×6 IMPLANT

## 2015-01-23 NOTE — Brief Op Note (Signed)
01/23/2015  5:51 PM  PATIENT:  Leroy LibmanMorgan T Shuffield  29 y.o. female  PRE-OPERATIVE DIAGNOSIS:  LEFT URETEROPELVIC JUNCTION OBSTRUCTION  POST-OPERATIVE DIAGNOSIS:  LEFT URETEROPELVIC JUNCTION OBSTRUCTION  PROCEDURE:  Procedure(s): ROBOTIC ASSISTED PYELOPLASTY WITH STENT PLACEMENT (Left) CYSTOSCOPY WITH LEFT RETROGRADE PYELOGRAM/URETERAL STENT PLACEMENT (Left)  SURGEON:  Surgeon(s) and Role:    * Sebastian Acheheodore Naquan Garman, MD - Primary  PHYSICIAN ASSISTANT:   ASSISTANTS: 1 - Flo ShanksAmanda Dancey PA, 2 - Loyola MastWilliam Kirby MD   ANESTHESIA:   general  EBL:  Total I/O In: 1000 [I.V.:1000] Out: 250 [Urine:200; Blood:50]  BLOOD ADMINISTERED:none  DRAINS: 1 - JP to bulb, 2 - Foley to straight drain, 3 - Flex-i-seal to straight drain   LOCAL MEDICATIONS USED:  MARCAINE     SPECIMEN:  Source of Specimen:  left UPJ  DISPOSITION OF SPECIMEN:  PATHOLOGY  COUNTS:  YES  TOURNIQUET:  * No tourniquets in log *  DICTATION: .Other Dictation: Dictation Number [id number]  PLAN OF CARE: Admit to inpatient   PATIENT DISPOSITION:  PACU - hemodynamically stable.   Delay start of Pharmacological VTE agent (>24hrs) due to surgical blood loss or risk of bleeding: yes

## 2015-01-23 NOTE — Anesthesia Postprocedure Evaluation (Signed)
  Anesthesia Post-op Note  Patient: Olivia Kelly  Procedure(s) Performed: Procedure(s): ROBOTIC ASSISTED PYELOPLASTY WITH STENT PLACEMENT (Left) CYSTOSCOPY WITH LEFT RETROGRADE PYELOGRAM/URETERAL STENT PLACEMENT (Left)  Patient Location: PACU  Anesthesia Type:General  Level of Consciousness: awake and alert   Airway and Oxygen Therapy: Patient Spontanous Breathing  Post-op Pain: mild  Post-op Assessment: Post-op Vital signs reviewed  Post-op Vital Signs: stable  Last Vitals:  Filed Vitals:   01/23/15 1945  BP: 156/90  Pulse:   Temp: 37 C  Resp: 18    Complications: No apparent anesthesia complications

## 2015-01-23 NOTE — Anesthesia Preprocedure Evaluation (Addendum)
Anesthesia Evaluation  Patient identified by MRN, date of birth, ID band Patient awake    Reviewed: Allergy & Precautions, H&P , Patient's Chart, lab work & pertinent test results, reviewed documented beta blocker date and time   History of Anesthesia Complications Negative for: history of anesthetic complications  Airway Mallampati: I  TM Distance: >3 FB Neck ROM: full    Dental no notable dental hx. (+) Teeth Intact   Pulmonary neg pulmonary ROS,  breath sounds clear to auscultation  Pulmonary exam normal       Cardiovascular negative cardio ROS  Rhythm:regular Rate:Normal     Neuro/Psych Seizures -,     GI/Hepatic negative GI ROS, Neg liver ROS,   Endo/Other  negative endocrine ROS  Renal/GU negative Renal ROS   See urologic notes    Musculoskeletal   Abdominal   Peds  Hematology negative hematology ROS (+)   Anesthesia Other Findings   Reproductive/Obstetrics                            Anesthesia Physical Anesthesia Plan  ASA: II  Anesthesia Plan: General   Post-op Pain Management:    Induction: Intravenous  Airway Management Planned: Oral ETT  Additional Equipment:   Intra-op Plan:   Post-operative Plan: Extubation in OR  Informed Consent: I have reviewed the patients History and Physical, chart, labs and discussed the procedure including the risks, benefits and alternatives for the proposed anesthesia with the patient or authorized representative who has indicated his/her understanding and acceptance.   Dental advisory given  Plan Discussed with: CRNA and Surgeon  Anesthesia Plan Comments:        Anesthesia Quick Evaluation

## 2015-01-23 NOTE — Transfer of Care (Signed)
Immediate Anesthesia Transfer of Care Note  Patient: Olivia Kelly  Procedure(s) Performed: Procedure(s): ROBOTIC ASSISTED PYELOPLASTY WITH STENT PLACEMENT (Left) CYSTOSCOPY WITH LEFT RETROGRADE PYELOGRAM/URETERAL STENT PLACEMENT (Left)  Patient Location: PACU  Anesthesia Type:General  Level of Consciousness:  sedated, patient cooperative and responds to stimulation  Airway & Oxygen Therapy:Patient Spontanous Breathing and Patient connected to face mask oxgen  Post-op Assessment:  Report given to PACU RN and Post -op Vital signs reviewed and stable  Post vital signs:  Reviewed and stable  Last Vitals:  Filed Vitals:   01/23/15 1053  BP: 116/69  Pulse: 99  Temp: 36.7 C  Resp: 16    Complications: No apparent anesthesia complications

## 2015-01-23 NOTE — Progress Notes (Signed)
Patient with c/o left eye pain. No noted debris in eye. Dr Jacklynn BueMassagee notified and MD said he would follow up.

## 2015-01-23 NOTE — Anesthesia Procedure Notes (Signed)
Procedure Name: Intubation Performed by: Anastasio ChampionEVANS, Jomel Whittlesey E Pre-anesthesia Checklist: Patient identified, Emergency Drugs available, Suction available and Patient being monitored Patient Re-evaluated:Patient Re-evaluated prior to inductionOxygen Delivery Method: Circle System Utilized Preoxygenation: Pre-oxygenation with 100% oxygen Intubation Type: IV induction Ventilation: Mask ventilation without difficulty Laryngoscope Size: Mac and 3 Tube type: Oral Tube size: 7.0 mm Number of attempts: 1 Airway Equipment and Method: Stylet and Oral airway Placement Confirmation: ETT inserted through vocal cords under direct vision,  positive ETCO2 and breath sounds checked- equal and bilateral Secured at: 21 cm Tube secured with: Tape Dental Injury: Teeth and Oropharynx as per pre-operative assessment

## 2015-01-23 NOTE — H&P (Signed)
Olivia Kelly is an 29 y.o. female.    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 UCX consistently e. coli res to amp but sens to keflex, cipro, bactrim, gent. CT 2016 with severe left hydro c/w UPJ obstruction. PVR 2016 "162m". She does report some temporal association of seizures around episodes but certainly not always.  2 - Left Severe Hydronephrosis /  UPJ Obstruction - severe left hydro with calyceal dilation and some parenchymal thinning by CT 09/2014 on eval UTI / pelvic pain. Appears 2 artery (1 smaller lower pole, very close aortic origin) 1 vein renovascular anatomy. Diagnostic ureteroscopy with high ectopic insertion (inserts laterally and high). Cr 1.0 2016. Denies recurrent flank pain, Dietel's crises. Renogram 10/2014 confirms left obstruction but some preserved function with  Lt T1/2 371m 43% function v. Rt T1/2 4.65m50m/ 57% function.   3 - Nephrolithiasis - incidetnal 2mm44mft renal pelvis stone (does NOT appear cause of left obstruction) by CT 09/2014. No contralateral stones or colic episodes.  PMH sig for seizure disorder (topomax, [facility] or blood thinners.   Today " MorgKatessas seen to proceed with left robotic pyeloplasty. She has been on Bactrim pre-op according to most recent UCX (pan-sensitive e. Coli) to reduce colonization.   Past Medical History  Diagnosis Date  . Seizures     since childhood-last seizure 09/20/14  . Headache     with seizures    Past Surgical History  Procedure Laterality Date  . Cystoscopy with retrograde pyelogram, ureteroscopy and stent placement Left 10/29/2014    Procedure: CYSTOSCOPY WITH LEFT RETROGRADE PYELOGRAM,  DIAGNOSTIC LEFT  URETEROSCOPY AND STENT PLACEMENT;  Surgeon: TheoAlexis Frock;  [facility];  Laterality: Left;  . Cyst removed middle of chest at 4-5 32e53rs old      Family History  Problem Relation Age of Onset  . Diabetes Mother   . Hypertension Mother   . Diabetes Father   . Cancer Other    Social History:  reports that she has never smoked. She has never used smokeless tobacco. She reports that she drinks alcohol. She reports that she does not use illicit drugs.  Allergies: No Known Allergies  No prescriptions prior to admission    Results for orders placed or performed during the hospital encounter of 01/22/15 (from the past 48 hour(s))  CBC     Status: Abnormal   Collection Time: 01/22/15  9:00 AM  Result Value Ref Range   WBC 3.3 (L) 4.0 - 10.5 K/uL   RBC 4.38 3.87 - 5.11 MIL/uL   Hemoglobin 12.7 12.0 - 15.0 g/dL   HCT 38.3 36.0 - 46.0 %   MCV 87.4 78.0 - 100.0 fL   MCH 29.0 26.0 - 34.0 pg   MCHC 33.2 30.0 - 36.0 g/dL   RDW 12.5 11.5 - 15.5 %   Platelets 278 150 - 400 K/uL  Basic metabolic panel     Status: Abnormal   Collection Time: 01/22/15  9:00 AM  Result Value Ref Range   Sodium 136 135 - 145 mmol/L   Potassium 4.1 3.5 - 5.1 mmol/L   Chloride 111 101 - 111 mmol/L   CO2 22 22 - 32 mmol/L  Glucose, Bld 99 65 - 99 mg/dL   BUN 12 6 - 20 mg/dL   Creatinine, Ser 0.95 0.44 - 1.00 mg/dL   Calcium 9.1 8.9 - 10.3 mg/dL   GFR calc non Af Amer >60 >60 mL/min   GFR calc Af Amer >60 >60 mL/min    Comment: (NOTE) The eGFR has been calculated using the CKD EPI equation. This calculation has not been validated in all clinical situations. eGFR's persistently <60 mL/min signify possible Chronic Kidney Disease.    Anion gap 3 (L) 5 - 15  hCG, serum, qualitative     Status: None   Collection Time: 01/22/15  9:00 AM  Result Value Ref Range   Preg, Serum NEGATIVE NEGATIVE    Comment:        THE SENSITIVITY OF THIS METHODOLOGY IS >10 mIU/mL.   Type and screen     Status: None   Collection Time: 01/22/15  9:00 AM   Result Value Ref Range   ABO/RH(D) B POS    Antibody Screen NEG    Sample Expiration 01/26/2015    No results found.  Review of Systems  Constitutional: Negative.  Negative for fever and chills.  HENT: Negative.   Eyes: Negative.   Respiratory: Negative.   Cardiovascular: Negative.   Gastrointestinal: Negative.   Genitourinary: Negative.   Musculoskeletal: Negative.   Skin: Negative.   Neurological: Negative.   Endo/Heme/Allergies: Negative.   Psychiatric/Behavioral: Negative.     There were no vitals taken for this visit. Physical Exam  Constitutional: She appears well-developed.  HENT:  Head: Normocephalic.  Eyes: Pupils are equal, round, and reactive to light.  Neck: Normal range of motion.  Cardiovascular: Normal rate.   Respiratory: Effort normal.  GI: Soft.  Genitourinary:  No CVAT at present  Musculoskeletal: Normal range of motion.  Neurological: She is alert.  Skin: Skin is warm.  Psychiatric: She has a normal mood and affect. Her behavior is normal. Judgment and thought content normal.     Assessment/Plan   1 - Recurrent UTI - left UPJ obstructin likely contributer as per above. PVR  acceptable. I am concerned these recurrent episodes may be triggering seizures as well as her patterns seems to follow UTI.   2 - Left Severe Hydronephrosis / UPJ Obstruction - preserved renal function but significant obstruction and anatomic abnormality with high ectopic insertion. Rec robotic dismembered, substraction pyeloplasty in elective setting.  Risks, benefits, alternatives again discussed and she wants to proceed today as planned.   3 - Nephrolithiasis - appears "secondary" stone due to chronic left upper tract urinary stasis, non-obstructing, Will attempt removal at pyeloplasty pending anatomy.     Salih Williamson 01/23/2015, 6:45 AM

## 2015-01-24 LAB — CREATININE, FLUID (PLEURAL, PERITONEAL, JP DRAINAGE): CREAT FL: 0.9 mg/dL

## 2015-01-24 LAB — HEMOGLOBIN AND HEMATOCRIT, BLOOD
HCT: 35.9 % — ABNORMAL LOW (ref 36.0–46.0)
Hemoglobin: 11.5 g/dL — ABNORMAL LOW (ref 12.0–15.0)

## 2015-01-24 MED ORDER — SODIUM CHLORIDE 0.9 % IJ SOLN: 3.0000 mL | Freq: Two times a day (BID) | INTRAMUSCULAR | Status: DC

## 2015-01-24 MED ORDER — OXYBUTYNIN CHLORIDE 5 MG PO TABS: 5.0000 mg | ORAL_TABLET | Freq: Three times a day (TID) | ORAL | Status: DC | PRN

## 2015-01-24 MED ORDER — TOPIRAMATE 100 MG PO TABS
100.0000 mg | ORAL_TABLET | Freq: Two times a day (BID) | ORAL | Status: DC
  Administered 2015-01-24 (×2): 100 mg via ORAL
  Filled 2015-01-24 (×3): qty 1

## 2015-01-24 MED ORDER — SENNOSIDES-DOCUSATE SODIUM 8.6-50 MG PO TABS
2.0000 | ORAL_TABLET | Freq: Every day | ORAL | Status: DC
  Administered 2015-01-24: 2 via ORAL
  Filled 2015-01-24 (×2): qty 2

## 2015-01-24 MED ORDER — DIPHENHYDRAMINE HCL 12.5 MG/5ML PO ELIX: 12.5000 mg | ORAL_SOLUTION | Freq: Four times a day (QID) | ORAL | Status: DC | PRN

## 2015-01-24 MED ORDER — SIMETHICONE 80 MG PO CHEW
80.0000 mg | CHEWABLE_TABLET | Freq: Four times a day (QID) | ORAL | Status: DC | PRN
  Administered 2015-01-24 (×2): 80 mg via ORAL
  Filled 2015-01-24 (×3): qty 1

## 2015-01-24 MED ORDER — SODIUM CHLORIDE 0.9 % IV SOLN
INTRAVENOUS | Status: DC
  Administered 2015-01-24: 01:00:00 via INTRAVENOUS

## 2015-01-24 MED ORDER — DIPHENHYDRAMINE HCL 50 MG/ML IJ SOLN: 12.5000 mg | Freq: Four times a day (QID) | INTRAMUSCULAR | Status: DC | PRN

## 2015-01-24 MED ORDER — SODIUM CHLORIDE 0.9 % IJ SOLN: 3.0000 mL | INTRAMUSCULAR | Status: DC | PRN

## 2015-01-24 MED ORDER — SODIUM CHLORIDE 0.9 % IV SOLN: 250.0000 mL | INTRAVENOUS | Status: DC | PRN

## 2015-01-24 MED ORDER — CEFAZOLIN SODIUM 1-5 GM-% IV SOLN
1.0000 g | Freq: Three times a day (TID) | INTRAVENOUS | Status: AC
  Administered 2015-01-24 (×2): 1 g via INTRAVENOUS
  Filled 2015-01-24 (×2): qty 50

## 2015-01-24 MED ORDER — MORPHINE SULFATE 2 MG/ML IJ SOLN: 2.0000 mg | INTRAMUSCULAR | Status: DC | PRN

## 2015-01-24 MED ORDER — ONDANSETRON HCL 4 MG/2ML IJ SOLN: 4.0000 mg | INTRAMUSCULAR | Status: DC | PRN

## 2015-01-24 MED ORDER — OXYCODONE-ACETAMINOPHEN 5-325 MG PO TABS: 1.0000 | ORAL_TABLET | ORAL | Status: DC | PRN

## 2015-01-24 NOTE — Progress Notes (Signed)
1 Day Post-Op Subjective: The patient is doing well.  No nausea or vomiting. Pain is adequately controlled.  Has not yet ambulated.  No events overnight.  Objective: Vital signs in last 24 hours: Temp:  [97.4 F (36.3 C)-98.6 F (37 C)] 97.7 F (36.5 C) (05/21 0612) Pulse Rate:  [56-99] 59 (05/21 0612) Resp:  [10-18] 18 (05/21 0612) BP: (108-156)/(59-90) 108/59 mmHg (05/21 0612) SpO2:  [100 %] 100 % (05/21 0612) Weight:  [127 lb (57.607 kg)] 127 lb (57.607 kg) (05/20 1108)  Intake/Output from previous day: 05/20 0701 - 05/21 0700 In: 1650 [I.V.:1650] Out: 1155 [Urine:985; Drains:120; Blood:50] Intake/Output this shift: Total I/O In: -  Out: 450 [Urine:450]  Physical Exam:  General: Alert and oriented. CV: RRR Lungs: Clear bilaterally. GI: Soft, minimal distension and tenderness - drain with minimal serosang output Incisions: Clean, dry, and intact Urine: Clear Extremities: Nontender, no erythema, no edema.  Lab Results:  Recent Labs  01/22/15 0900 01/24/15 0535  HGB 12.7 11.5*  HCT 38.3 35.9*      Assessment/Plan: POD# 1 s/p robotic left pyeloplasty  Overall recovering well.  JP drain with minimal output.  FMS and foley removed this am.  JP creatinine = serum.    1) SL IVF 2) Ambulate, Incentive spirometry 3) Oral pain medication 4) D/C pelvic drain prior to discharge 6) Plan for likely discharge later today 7) Trial of void       Patient was seen, examined,treatment plan was discussed with the resident.  I have directly reviewed the clinical findings, lab, imaging studies and management of this patient in detail. I have made the necessary changes and/or additions to the above noted documentation, and agree with the documentation, as recorded by the resident.

## 2015-01-24 NOTE — Op Note (Signed)
NAME:  Olivia Kelly, Olivia Kelly NO.:  [id number]  MEDICAL RECORD NO.:  [id number]  LOCATION:  1508                         FACILITY:  [facility]  PHYSICIAN:  Sebastian Ache, MD     DATE OF BIRTH:  1986-04-02  DATE OF PROCEDURE: 01/23/2015                                OPERATIVE REPORT   PREOPERATIVE DIAGNOSES: 1. Left ureteropelvic junction obstruction. 2. Recurring urinary tract infections.  PROCEDURES: 1. Cystoscopy with left retrograde pyelogram interpretation. 2. Insertion of left ureteral stent, 6 x 24 Contour, no tether. 3. Left robotic-assisted laparoscopic left dismembered pyeloplasty.  ESTIMATED BLOOD LOSS:  Nil.  COMPLICATIONS:  None.  SPECIMEN:  Left UPJ obstruction for permanent pathology.  FINDINGS: 1. Hydronephrosis without ureteronephrosis and ectopic insertion of     left ureter as cause UPJ obstruction as anticipated. 2. Placement of left ureteral stent.  Proximal in renal pelvis and     distal in urinary bladder. 3. Incredibly abbreviated intraabdominal fat.  A transmesenteric     technique was therefore used for pyeloplasty.  ASSISTANTS: 1 - Flo Shanks, PA 2 - Loyola Mast, MD  DRAINS: 1. Jackson-Pratt drain with suction. 2. Foley catheter straight drain. 3. Flexi-Seal to the straight drain.  INDICATION:  Ms. Mealy is a very pleasant 29 year old young woman with history of urinary tract infections that have been recurrent and also sometimes a trigger for a seizure disorder and she was found on workup of this to have a left hydronephrosis without a ureteronephrosis consistent likely UPJ obstruction.  She underwent left retrograde pyelogram with diagnostic ureteroscopy previously, which corroborated a very unusual ectopic insertion as ureteropelvic junction obstruction. Renogram revealed excellent left renal function, but high-grade obstruction.  Options were discussed including definitive management with pyeloplasty and she wished to  proceed.  Informed consent was obtained and placed in medical record.  PROCEDURE IN DETAIL:  The patient being, Olivia Kelly, verified. Procedure being left robotic pyeloplasty with stent placement was confirmed.  Procedure was carried out.  Time-out was performed. Intravenous antibiotics were administered.  General endotracheal anesthesia was introduced.  The patient placed into a low-lithotomy position.  Sterile field was created, prepping and draping the patient's vagina, introitus, and proximal thighs using iodine x3.  The patient had received a bowel prep and there was still copious somewhat projectile liquid stool per rectum.  Therefore, a Flexi-Seal was placed and the patient was re-prepped at vagina, introitus, and proximal thighs.  Next, flexible cystoscopy was performed using a 23-French rigid cystoscope with 30-degree offset lens.  Inspection of bladder revealed no diverticula, calcifications, papular lesions.  The left ureteral orifice was cannulated with a 6-French Foley catheter and left retrograde pyelogram was obtained.  Left retrograde pyelogram demonstrated a single left ureter and single system left kidney.  There was significant hydronephrosis without ureteronephrosis and a very unusual ectopic insertion of left ureter inserting high and laterally as anticipated.  A 0.038 ZipWire was advanced at the level of the renal pelvis over which a new 6 x 24 Contour-type stent was placed proximal in the renal pelvis and distal in urinary bladder.  Foley catheterization was exchanged for a new 16- [place] Foley catheter per urethra to straight drain.  The patient was then repositioned into a left side up, full flank position applying 15 degrees stable flexion, superior arm elevator, axillary roll, bottom leg bent and top leg straight, bean bag.  She was further fashioned on the operative table using 3-inch tape over foam padding.  A sterile field was created by prepping and  draping the entire left flank using chlorhexidine gluconate.  Next, a high-flow low-pressure pneumoperitoneum was obtained using Veress technique in the left lower quadrant having passed the aspiration and drop test.  A 12-mm robotic camera port was then placed in position approximately 3 fingerbreadths superolateral to the umbilicus.  Laparoscopic examination of the peritoneal cavity revealed no significant adhesions and no visceral injury.  There was an incredible paucity of intraabdominal fat such that all major retroperitoneal structures including the kidney, left renal hilum, left ureter, left UPJ, aorta, quite easily be seen through the descending colon mesentery.  Additional ports in place as follows; left subcostal 8-mm robotic port, left inferior paramedian 8-mm robotic port, left far lateral 8-mm robotic port, 3 fingerbreadths superomedial to the anterior iliac spine.  A 12-mm assistant port in the midline in the infraumbilical crease and a 5-mm assistant port in the midline, 3 fingerbreadths above the camera port.  Robot was docked and passed through electronic checks.  Inspection of the area of the UPJ through the descending colon mesentery revealed excellent anatomic exposure of the structure.  The mesentery was quite abbreviated and mesenteric vessels were easily seen as well.  It was felt that a transmesocolic approach pyeloplasty would actually be preferred and least disruptive, as such an incision was made in the posterior peritoneum parallel to mesocolic vessels taking great care to avoid any injury to them, which did not occur.  The area of the UPJ was exposed and this was circumferentially mobilized for distance of approximately 4 cm distal to the UPJ and proximally to the lower and the mid pole of the kidney. As suspected the renal pelvis was quite redundant and there was ectopic insertion very high in non-anatomic position.  There appeared to be plenty of redundant  renal pelvis and ureteral tissue were dismembered for pyeloplasty.  The renal pelvis was then purposely cut at a tangential angle with the more inferior and being medial, superior and being lateral.  The ureter was laid out across this in a suitable position for ureteral resection was found.  Ureter was completely transected approximately 2 cm distal to the previous inferior border of renal pelvis.  The area of UPJ was set aside for permanent pathology. Great care was taken to avoid cutting or displacement of the ureteral stent in situ and this did not occur.  Renal pelvis was very carefully inspected.  No nephrolithiasis was seen, was also irrigated the each calix and no nephrolithiasis was seen.  The ureter was then spatulated and a mirror image to the angulation of the renal pelvis incision, a healed stitch of 3-0 Monocryl was placed.  Next, ureteral to renal pelvis anastomosis mucosa was performed in a running fashion using 2 separate running suture lines of the 3-0 Monocryl, which provided excellent deep tendons medial drainage of the area of the UPJ without any angulation or excessive tension.  The free end of the renal pelvis incision was closed using running 3-0 V-Loc and hemostasis appeared excellent.  Sponge and needle counts were correct.  The mesenteric defect was closed using 3-0 V-Loc purposely loose reapproximation to prevent internal herniation, but to allow any drainage into the  peritoneal cavity to prevent a retroperitoneal urinoma.  Closed suction drain was brought through the previous left lateral most robotic port site and the area of the peritoneal cavity.  Robot was then undocked. The previous 12-mm system port and camera port site were closed with fascia using figure-of-eight Vicryl.  All incision sites were infiltrated with dilute lyophilized Marcaine and closed the skin using subcuticular Monocryl followed by Dermabond.  Drain system was applied.  Procedure was  then terminated. The patient tolerated the procedure well and there were no immediate periprocedural complications.  The patient was taken to postanesthesia care unit in stable condition.          ______________________________ Sebastian Acheheodore Treyten Monestime, MD     TM/MEDQ  D:  01/23/2015  T:  01/24/2015  Job:  [id number]

## 2015-01-24 NOTE — Discharge Instructions (Signed)
1.  Activity:  You are encouraged to ambulate frequently (about every hour during waking hours) to help prevent blood clots from forming in your legs or lungs.  However, you should not engage in any heavy lifting (> 10-15 lbs), strenuous activity, or straining. 2. Diet: You should advance your diet as instructed by your physician.  It will be normal to have some bloating, nausea, and abdominal discomfort intermittently. 3. Prescriptions:  You will be provided a prescription for pain medication to take as needed.  If your pain is not severe enough to require the prescription pain medication, you may take extra strength Tylenol instead which will have less side effects.  You should also take a stool softener to avoid straining with bowel movements as the prescription pain medication may constipate you. 4. Incisions: You may remove your dressing bandages 48 hours after surgery if not removed in the hospital.  You will either have some small staples or special tissue glue at each of the incision sites. Once the bandages are removed (if present), the incisions may stay open to air.  You may start showering (but not soaking or bathing in water) the 2nd day after surgery and the incisions simply need to be patted dry after the shower.  No additional care is needed. 5. What to call us about: You should call the office [phone]) if you develop fever > 101 or develop persistent vomiting.

## 2015-01-24 NOTE — Progress Notes (Signed)
Pt arrived to floor room 1508 via stretcher . VS taken pt oriented to room with no complaints. Gait unsteady, general weakness no complaint of pain. Initial assessment completed, will continue to monitor throughout shift

## 2015-01-24 NOTE — Progress Notes (Signed)
Pt left at this time with her family. Alert, oriented, and without c/o. Discharge instructions/prescriptions given/explained with pt verbalizing understanding. Followup appointment noted. Dressing supplies given for removed JP drain.

## 2015-01-24 NOTE — Progress Notes (Signed)
This shift RN attempted to remove flexiseal but was met with much resistance. 2nd RN called to assist. After changing pt in several positions, resistance continued to be met and pain for the pt. Called ICU for assistance, to no avail. Will consult GI  For further assistance and relay info to oncoming shift.

## 2015-01-24 NOTE — Discharge Summary (Signed)
Date of admission: 01/23/2015  Date of discharge: 01/24/2015  Admission diagnosis: Left UPJ Obstruction  Discharge diagnosis: Same  History and Physical: For full details, please see admission history and physical. Briefly, Olivia Kelly is a 29 y.o. year old patient with history of left UPJ obstruction.     Hospital Course: The patient underwent robotic laparoscopic left pyeloplasty on 01/23/15. They were taken to PACU for routine recovery before transfer to the floor. Their diet was advanced to regular on POD#1 and foley catheter was removed.  She voided well following removal.  Their pain was adequately controlled on PO pain pills. They were able to ambulate. JP drain was removed on POD#1. They were deemed suitable for discharge on POD#1.  Laboratory values:  Recent Labs  01/22/15 0900 01/24/15 0535  HGB 12.7 11.5*  HCT 38.3 35.9*    Recent Labs  01/22/15 0900  CREATININE 0.95    Exam: normal WOB. Incisions C/D/I. Abdomen soft, NT/ND. Extremities WWP, no edema  Disposition: Home  Discharge medications:    Medication List    STOP taking these medications        cephALEXin 500 MG capsule  Commonly known as:  KEFLEX     oxyCODONE-acetaminophen 5-325 MG per tablet  Commonly known as:  ROXICET     senna-docusate 8.6-50 MG per tablet  Commonly known as:  Senokot-S      TAKE these medications        HYDROcodone-acetaminophen 5-325 MG per tablet  Commonly known as:  NORCO  Take 1-2 tablets by mouth every 6 (six) hours as needed.     levonorgestrel 20 MCG/24HR IUD  Commonly known as:  MIRENA  1 each by Intrauterine route once.     topiramate 100 MG tablet  Commonly known as:  TOPAMAX  Take 1 tablet (100 mg total) by mouth 2 (two) times daily.        Followup:      Follow-up Information    Follow up with Sebastian AcheMANNY, THEODORE, MD On 02/09/2015.   Specialty:  Urology   Why:  at 1:00   Contact information:   [address] [place] [address] [phone]

## 2015-01-26 ENCOUNTER — Encounter (HOSPITAL_COMMUNITY): Payer: Self-pay | Admitting: Urology

## 2015-02-18 ENCOUNTER — Encounter: Payer: Self-pay | Admitting: Obstetrics & Gynecology

## 2015-02-18 ENCOUNTER — Ambulatory Visit (INDEPENDENT_AMBULATORY_CARE_PROVIDER_SITE_OTHER): Payer: Medicaid Other | Admitting: Obstetrics & Gynecology

## 2015-02-18 VITALS — BP 111/69 | HR 81 | Temp 98.2°F | Ht 62.0 in | Wt 126.2 lb

## 2015-02-18 DIAGNOSIS — N871 Moderate cervical dysplasia: Secondary | ICD-10-CM

## 2015-02-18 DIAGNOSIS — Z9889 Other specified postprocedural states: Secondary | ICD-10-CM

## 2015-02-18 DIAGNOSIS — Z09 Encounter for follow-up examination after completed treatment for conditions other than malignant neoplasm: Secondary | ICD-10-CM

## 2015-02-19 ENCOUNTER — Encounter: Payer: Self-pay | Admitting: Obstetrics & Gynecology

## 2015-02-19 NOTE — Patient Instructions (Signed)
Return to clinic for any scheduled appointments or for any gynecologic concerns as needed.   

## 2015-02-19 NOTE — Progress Notes (Signed)
    Subjective:     Olivia Kelly is a 29 y.o. 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  Had some discharge after LEEP, nothing since. No other issues.  The following portions of the patient's history were reviewed and updated as appropriate: allergies, current medications, past family history, past medical history, past social history, past surgical history and problem list.  Review of Systems Pertinent items are noted in HPI.    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 - ENDOCERVICAL AND ECTOCERVICAL MARGINS NEGATIVE FOR DYSPLASIA.  Assessment:   Doing well postoperatively. Operative findings again reviewed. Pathology report discussed.    Plan:   1. Continue any current medications. 2. Wound care discussed. 3. Activity restrictions: none 4. Anticipated return to work: now 5. Follow up one year for pap smear   Jaynie Collins, MD, FACOG Attending Obstetrician & Gynecologist Center for [facility], [facility]

## 2015-04-01 ENCOUNTER — Ambulatory Visit (INDEPENDENT_AMBULATORY_CARE_PROVIDER_SITE_OTHER): Payer: Medicaid Other | Admitting: Obstetrics & Gynecology

## 2015-04-01 ENCOUNTER — Encounter: Payer: Self-pay | Admitting: Obstetrics & Gynecology

## 2015-04-01 VITALS — BP 120/76 | HR 77 | Temp 98.2°F | Ht 62.0 in | Wt 128.2 lb

## 2015-04-01 DIAGNOSIS — Z975 Presence of (intrauterine) contraceptive device: Secondary | ICD-10-CM | POA: Diagnosis not present

## 2015-04-01 DIAGNOSIS — Z538 Procedure and treatment not carried out for other reasons: Secondary | ICD-10-CM

## 2015-04-01 NOTE — Progress Notes (Signed)
   Subjective:    Patient ID: Olivia Kelly, female    DOB: Jan 22, 1986, 29 y.o.   MRN: [id number]  HPI  This 29 yo AA woman would like a pregnancy. She has been taking folic acid. She wants her Mirena removed.  Review of Systems     Objective:   Physical Exam  WNWHWFNAD Abd- benign I attempted to remove her IUD but the strings were not visible.       Assessment & Plan:  Retained IUD- schedule u/s to see if it is in there and if so, schedule removal in the OR

## 2015-04-15 ENCOUNTER — Ambulatory Visit (HOSPITAL_COMMUNITY)
Admission: RE | Admit: 2015-04-15 | Discharge: 2015-04-15 | Disposition: A | Discharge status: Home / Self Care | Payer: Medicaid Other | Source: Ambulatory Visit | Attending: Obstetrics & Gynecology | Admitting: Obstetrics & Gynecology

## 2015-04-15 ENCOUNTER — Other Ambulatory Visit: Payer: Self-pay | Admitting: Obstetrics & Gynecology

## 2015-04-15 DIAGNOSIS — Z538 Procedure and treatment not carried out for other reasons: Secondary | ICD-10-CM

## 2015-04-15 DIAGNOSIS — Z30431 Encounter for routine checking of intrauterine contraceptive device: Secondary | ICD-10-CM | POA: Diagnosis not present

## 2015-04-15 DIAGNOSIS — Z975 Presence of (intrauterine) contraceptive device: Secondary | ICD-10-CM

## 2015-04-23 ENCOUNTER — Encounter: Payer: Self-pay | Admitting: Obstetrics & Gynecology

## 2015-04-23 ENCOUNTER — Ambulatory Visit (INDEPENDENT_AMBULATORY_CARE_PROVIDER_SITE_OTHER): Payer: Medicaid Other | Admitting: Obstetrics & Gynecology

## 2015-04-23 VITALS — BP 123/74 | HR 97 | Temp 98.3°F | Ht 65.0 in | Wt 126.3 lb

## 2015-04-23 DIAGNOSIS — T8389XD Other specified complication of genitourinary prosthetic devices, implants and grafts, subsequent encounter: Secondary | ICD-10-CM | POA: Diagnosis present

## 2015-04-23 DIAGNOSIS — T8332XD Displacement of intrauterine contraceptive device, subsequent encounter: Secondary | ICD-10-CM

## 2015-04-23 NOTE — Progress Notes (Signed)
   Subjective:    Patient ID: Olivia Kelly, female    DOB: 04/03/1986, 29 y.o.   MRN: [id number]  HPI  29 yo young lady who has a retained IUD.   Review of Systems     Objective:   Physical Exam WNWHBFNAD Breathing, conversing and ambulating normally       Assessment & Plan:  Retained IUD- plan for removal in the OR She will start folic acid daily and we have discussed zika prevention

## 2015-05-13 ENCOUNTER — Encounter (HOSPITAL_COMMUNITY): Payer: Self-pay | Admitting: *Deleted

## 2015-05-27 ENCOUNTER — Telehealth: Payer: Self-pay

## 2015-05-27 NOTE — Telephone Encounter (Signed)
Called pt and verified that pt is aware of her surgery appt for IUD removal on 06/09/15 @ 4:15pm.  Pt stated that she knows of the appt and she does not have any questions at this time.

## 2015-06-02 ENCOUNTER — Encounter (HOSPITAL_COMMUNITY): Payer: Self-pay | Admitting: *Deleted

## 2015-06-09 ENCOUNTER — Ambulatory Visit (HOSPITAL_COMMUNITY): Payer: Medicaid Other | Admitting: Anesthesiology

## 2015-06-09 ENCOUNTER — Ambulatory Visit (HOSPITAL_COMMUNITY)
Admission: RE | Admit: 2015-06-09 | Discharge: 2015-06-09 | Disposition: A | Discharge status: Home / Self Care | Payer: Medicaid Other | Source: Ambulatory Visit | Attending: Obstetrics & Gynecology | Admitting: Obstetrics & Gynecology

## 2015-06-09 ENCOUNTER — Encounter (HOSPITAL_COMMUNITY): Payer: Self-pay

## 2015-06-09 ENCOUNTER — Encounter (HOSPITAL_COMMUNITY)
Admission: RE | Disposition: A | Discharge status: Home / Self Care | Payer: Self-pay | Source: Ambulatory Visit | Attending: Obstetrics & Gynecology

## 2015-06-09 DIAGNOSIS — Z79899 Other long term (current) drug therapy: Secondary | ICD-10-CM | POA: Diagnosis not present

## 2015-06-09 DIAGNOSIS — Z30432 Encounter for removal of intrauterine contraceptive device: Secondary | ICD-10-CM | POA: Insufficient documentation

## 2015-06-09 HISTORY — PX: IUD REMOVAL: SHX5392

## 2015-06-09 LAB — CBC
HCT: 37.5 % (ref 36.0–46.0)
Hemoglobin: 12.8 g/dL (ref 12.0–15.0)
MCH: 29 pg (ref 26.0–34.0)
MCHC: 34.1 g/dL (ref 30.0–36.0)
MCV: 85 fL (ref 78.0–100.0)
Platelets: 264 10*3/uL (ref 150–400)
RBC: 4.41 MIL/uL (ref 3.87–5.11)
RDW: 12.6 % (ref 11.5–15.5)
WBC: 3.5 10*3/uL — ABNORMAL LOW (ref 4.0–10.5)

## 2015-06-09 SURGERY — REMOVAL, INTRAUTERINE DEVICE
Anesthesia: General | Site: Vagina

## 2015-06-09 MED ORDER — MEPERIDINE HCL 25 MG/ML IJ SOLN: 6.2500 mg | INTRAMUSCULAR | Status: DC | PRN

## 2015-06-09 MED ORDER — ONDANSETRON HCL 4 MG/2ML IJ SOLN
INTRAMUSCULAR | Status: AC
  Filled 2015-06-09: qty 2

## 2015-06-09 MED ORDER — MIDAZOLAM HCL 2 MG/2ML IJ SOLN
INTRAMUSCULAR | Status: DC | PRN
  Administered 2015-06-09: 1 mg via INTRAVENOUS

## 2015-06-09 MED ORDER — DEXAMETHASONE SODIUM PHOSPHATE 10 MG/ML IJ SOLN
INTRAMUSCULAR | Status: DC | PRN
  Administered 2015-06-09: 4 mg via INTRAVENOUS

## 2015-06-09 MED ORDER — LACTATED RINGERS IV SOLN
INTRAVENOUS | Status: DC
  Administered 2015-06-09: 1000 mL via INTRAVENOUS
  Administered 2015-06-09: 10 mL/h via INTRAVENOUS
  Administered 2015-06-09: 15:00:00 via INTRAVENOUS

## 2015-06-09 MED ORDER — PROPOFOL 10 MG/ML IV BOLUS
INTRAVENOUS | Status: DC | PRN
  Administered 2015-06-09: 180 mg via INTRAVENOUS

## 2015-06-09 MED ORDER — ONDANSETRON HCL 4 MG/2ML IJ SOLN
INTRAMUSCULAR | Status: DC | PRN
  Administered 2015-06-09: 4 mg via INTRAVENOUS

## 2015-06-09 MED ORDER — LIDOCAINE HCL (CARDIAC) 20 MG/ML IV SOLN
INTRAVENOUS | Status: DC | PRN
  Administered 2015-06-09: 100 mg via INTRAVENOUS

## 2015-06-09 MED ORDER — BUPIVACAINE HCL (PF) 0.5 % IJ SOLN
INTRAMUSCULAR | Status: DC | PRN
  Administered 2015-06-09: 30 mL

## 2015-06-09 MED ORDER — PROPOFOL 10 MG/ML IV BOLUS
INTRAVENOUS | Status: AC
  Filled 2015-06-09: qty 20

## 2015-06-09 MED ORDER — MIDAZOLAM HCL 2 MG/2ML IJ SOLN
INTRAMUSCULAR | Status: AC
  Filled 2015-06-09: qty 4

## 2015-06-09 MED ORDER — SCOPOLAMINE 1 MG/3DAYS TD PT72
MEDICATED_PATCH | TRANSDERMAL | Status: AC
  Filled 2015-06-09: qty 1

## 2015-06-09 MED ORDER — SCOPOLAMINE 1 MG/3DAYS TD PT72
1.0000 | MEDICATED_PATCH | Freq: Once | TRANSDERMAL | Status: DC
  Administered 2015-06-09: 1.5 mg via TRANSDERMAL

## 2015-06-09 MED ORDER — FENTANYL CITRATE (PF) 100 MCG/2ML IJ SOLN
INTRAMUSCULAR | Status: DC | PRN
  Administered 2015-06-09: 100 ug via INTRAVENOUS

## 2015-06-09 MED ORDER — ONDANSETRON HCL 4 MG/2ML IJ SOLN: 4.0000 mg | Freq: Once | INTRAMUSCULAR | Status: DC | PRN

## 2015-06-09 MED ORDER — LIDOCAINE HCL (CARDIAC) 20 MG/ML IV SOLN
INTRAVENOUS | Status: AC
  Filled 2015-06-09: qty 5

## 2015-06-09 MED ORDER — BUPIVACAINE HCL (PF) 0.5 % IJ SOLN
INTRAMUSCULAR | Status: AC
  Filled 2015-06-09: qty 30

## 2015-06-09 MED ORDER — KETOROLAC TROMETHAMINE 30 MG/ML IJ SOLN: 30.0000 mg | Freq: Once | INTRAMUSCULAR | Status: DC | PRN

## 2015-06-09 MED ORDER — FENTANYL CITRATE (PF) 100 MCG/2ML IJ SOLN: 25.0000 ug | INTRAMUSCULAR | Status: DC | PRN

## 2015-06-09 MED ORDER — FENTANYL CITRATE (PF) 250 MCG/5ML IJ SOLN
INTRAMUSCULAR | Status: AC
  Filled 2015-06-09: qty 25

## 2015-06-09 SURGICAL SUPPLY — 13 items
CATH ROBINSON RED A/P 16FR (CATHETERS) ×3 IMPLANT
CLOTH BEACON ORANGE TIMEOUT ST (SAFETY) ×3 IMPLANT
GLOVE BIO SURGEON STRL SZ 6.5 (GLOVE) ×2 IMPLANT
GLOVE BIO SURGEONS STRL SZ 6.5 (GLOVE) ×1
GLOVE BIOGEL PI IND STRL 6.5 (GLOVE) ×2 IMPLANT
GLOVE BIOGEL PI INDICATOR 6.5 (GLOVE) ×4
GLOVE SURG SS PI 7.0 STRL IVOR (GLOVE) ×9 IMPLANT
GOWN STRL REUS W/TWL LRG LVL3 (GOWN DISPOSABLE) ×6 IMPLANT
NEEDLE SPNL 22GX3.5 QUINCKE BK (NEEDLE) ×3 IMPLANT
PACK VAGINAL MINOR WOMEN LF (CUSTOM PROCEDURE TRAY) ×3 IMPLANT
PAD PREP 24X48 CUFFED NSTRL (MISCELLANEOUS) ×3 IMPLANT
TOWEL OR 17X24 6PK STRL BLUE (TOWEL DISPOSABLE) ×6 IMPLANT
WATER STERILE IRR 1000ML POUR (IV SOLUTION) ×3 IMPLANT

## 2015-06-09 NOTE — Discharge Instructions (Signed)

## 2015-06-09 NOTE — Anesthesia Preprocedure Evaluation (Signed)
Anesthesia Evaluation  Patient identified by MRN, date of birth, ID band Patient awake    Reviewed: Allergy & Precautions, H&P , NPO status , Patient's Chart, lab work & pertinent test results  History of Anesthesia Complications Negative for: history of anesthetic complications  Airway Mallampati: I  TM Distance: >3 FB Neck ROM: full    Dental no notable dental hx. (+) Teeth Intact   Pulmonary neg pulmonary ROS,    Pulmonary exam normal        Cardiovascular negative cardio ROS Normal cardiovascular exam     Neuro/Psych Seizures -,     GI/Hepatic negative GI ROS, Neg liver ROS,   Endo/Other  negative endocrine ROS  Renal/GU    See urologic notes    Musculoskeletal   Abdominal Normal abdominal exam  (+)   Peds  Hematology negative hematology ROS (+)   Anesthesia Other Findings   Reproductive/Obstetrics                             Anesthesia Physical  Anesthesia Plan  ASA: II  Anesthesia Plan: General   Post-op Pain Management:    Induction: Intravenous  Airway Management Planned: LMA  Additional Equipment:   Intra-op Plan:   Post-operative Plan:   Informed Consent: I have reviewed the patients History and Physical, chart, labs and discussed the procedure including the risks, benefits and alternatives for the proposed anesthesia with the patient or authorized representative who has indicated his/her understanding and acceptance.     Plan Discussed with: CRNA and Surgeon  Anesthesia Plan Comments:         Anesthesia Quick Evaluation

## 2015-06-09 NOTE — Anesthesia Procedure Notes (Signed)
Procedure Name: LMA Insertion Date/Time: 06/09/2015 3:41 PM Performed by: Cephus Shelling A Pre-anesthesia Checklist: Patient identified, Patient being monitored, Emergency Drugs available, Timeout performed and Suction available Patient Re-evaluated:Patient Re-evaluated prior to inductionOxygen Delivery Method: Circle System Utilized Preoxygenation: Pre-oxygenation with 100% oxygen Intubation Type: IV induction Ventilation: Mask ventilation without difficulty LMA: LMA inserted LMA Size: 4.0 Number of attempts: 1 Placement Confirmation: positive ETCO2 and breath sounds checked- equal and bilateral

## 2015-06-09 NOTE — H&P (Addendum)
Olivia Kelly is an 29 y.o. S AA P1 (8 year daughter) here to have her retained IUD removed.  This Mirena has been in for 3 years. This is her second Mirena since her daughter's birth. Her periods are irregular.      No LMP recorded.    Past Medical History  Diagnosis Date  . Seizures (HCC)     last seizure 2015    Past Surgical History  Procedure Laterality Date  . Cystoscopy with retrograde pyelogram, ureteroscopy and stent placement Left 10/29/2014    Procedure: CYSTOSCOPY WITH LEFT RETROGRADE PYELOGRAM,  DIAGNOSTIC LEFT  URETEROSCOPY AND STENT PLACEMENT;  Surgeon: Sebastian Ache, MD;  Location: [facility];  Service: Urology;  Laterality: Left;  . Cyst removed middle of chest at 8-63 years old    . Robot assisted pyeloplasty Left 01/23/2015    Procedure: ROBOTIC ASSISTED PYELOPLASTY WITH STENT PLACEMENT;  Surgeon: Sebastian Ache, MD;  Location: [facility];  Service: Urology;  Laterality: Left;  . Cystoscopy with retrograde pyelogram, ureteroscopy and stent placement Left 01/23/2015    Procedure: CYSTOSCOPY WITH LEFT RETROGRADE PYELOGRAM/URETERAL STENT PLACEMENT;  Surgeon: Sebastian Ache, MD;  Location: [facility];  Service: Urology;  Laterality: Left;    Family History  Problem Relation Age of Onset  . Diabetes Mother   . Hypertension Mother   . Diabetes Father   . Cancer Other     Social History:  reports that she has never smoked. She has never used smokeless tobacco. She reports that she drinks alcohol. She reports that she does not use illicit drugs.  Allergies: No Known Allergies  Prescriptions prior to admission  Medication Sig Dispense Refill Last Dose  . FOLIC ACID PO Take 1 tablet by mouth daily.   06/09/2015 at 0520  . levonorgestrel (MIRENA) 20 MCG/24HR IUD 1 each by Intrauterine route once.     . topiramate (TOPAMAX) 100 MG tablet Take 1 tablet (100 mg total) by mouth 2 (two) times daily. 14 tablet 0 06/09/2015 at 0545    ROS  Blood pressure 111/77,  pulse 72, temperature 98.4 F (36.9 C), temperature source Oral, resp. rate 16, height  (1.575 m), weight 57.153 kg (126 lb), SpO2 100 %. Physical Exam WNWNBFNAD Heart- rrr Lungs- CTAB Abd- benign Results for orders placed or performed during the hospital encounter of 06/09/15 (from the past 24 hour(s))  CBC     Status: Abnormal   Collection Time: 06/09/15 12:19 PM  Result Value Ref Range   WBC 3.5 (L) 4.0 - 10.5 K/uL   RBC 4.41 3.87 - 5.11 MIL/uL   Hemoglobin 12.8 12.0 - 15.0 g/dL   HCT 13.2 44.0 - 10.2 %   MCV 85.0 78.0 - 100.0 fL   MCH 29.0 26.0 - 34.0 pg   MCHC 34.1 30.0 - 36.0 g/dL   RDW 72.5 36.6 - 44.0 %   Platelets 264 150 - 400 K/uL    No results found.  Assessment/Plan: Retained IUD- plan for removal in OR  She understands the risks of surgery, including, but not to infection, bleeding, DVTs, damage to bowel, bladder, ureters. She wishes to proceed.     Olivia Kelly C. 06/09/2015, 3:16 PM

## 2015-06-09 NOTE — Op Note (Signed)
06/09/2015  3:47 PM  PATIENT:  Olivia Kelly  29 y.o. female  PRE-OPERATIVE DIAGNOSIS:  Retained IUD  POST-OPERATIVE DIAGNOSIS:  Retained IUD  PROCEDURE:  Procedure(s): INTRAUTERINE DEVICE (IUD) REMOVAL (N/A)  SURGEON:  Surgeon(s) and Role:    * Allie Bossier, MD - Primary  ANESTHESIA:   general  EBL:  Total I/O In: -  Out: 2 [Blood:2]  BLOOD ADMINISTERED:none  DRAINS: none   LOCAL MEDICATIONS USED:  NONE  SPECIMEN:  No Specimen  DISPOSITION OF SPECIMEN:  N/A  COUNTS:  YES  TOURNIQUET:  * No tourniquets in log *  DICTATION: .Dragon Dictation  PLAN OF CARE: Discharge to home after PACU  PATIENT DISPOSITION:  PACU - hemodynamically stable.   Delay start of Pharmacological VTE agent (>24hrs) due to surgical blood loss or risk of bleeding: not applicable    The risks, benefits, and alternatives of surgery were explained, understood, and accepted. All questions were answered. Consents were signed. In the operating room MAC anesthesia was applied without complication, and she was placed in the dorsal lithotomy position. Her vagina was prepped and draped in the usual sterile fashion.  A bimanual exam revealed a normal size , anteverted mobile uterus. Her adnexa were nonenlarged. A speculum was placed and a single-tooth tenaculum was used to grasp the anterior lip of her cervix. I used a small serrated curette to remove the intact Mirena IUD.  There was no bleeding noted at the end of the case. She was taken to the recovery room after being extubated. She tolerated the procedure well.

## 2015-06-09 NOTE — Anesthesia Postprocedure Evaluation (Signed)
Anesthesia Post Note  Patient: Olivia Kelly  Procedure(s) Performed: Procedure(s) (LRB): INTRAUTERINE DEVICE (IUD) REMOVAL (N/A)  Anesthesia type: General  Patient location: PACU  Post pain: Pain level controlled  Post assessment: Post-op Vital signs reviewed  Last Vitals:  Filed Vitals:   06/09/15 1730  BP: 107/72  Pulse: 59  Temp: 36.7 C  Resp: 16    Post vital signs: Reviewed  Level of consciousness: sedated  Complications: No apparent anesthesia complications

## 2015-06-09 NOTE — Transfer of Care (Signed)
Immediate Anesthesia Transfer of Care Note  Patient: Olivia Kelly  Procedure(s) Performed: Procedure(s): INTRAUTERINE DEVICE (IUD) REMOVAL (N/A)  Patient Location: PACU  Anesthesia Type:General  Level of Consciousness: awake  Airway & Oxygen Therapy: Patient Spontanous Breathing  Post-op Assessment: Report given to PACU RN  Post vital signs: stable  Filed Vitals:   06/09/15 1212  BP: 111/77  Pulse: 72  Temp: 36.9 C  Resp: 16    Complications: No apparent anesthesia complications

## 2015-06-10 ENCOUNTER — Encounter (HOSPITAL_COMMUNITY): Payer: Self-pay | Admitting: Obstetrics & Gynecology

## 2015-07-12 ENCOUNTER — Encounter (HOSPITAL_COMMUNITY): Payer: Self-pay

## 2015-07-12 ENCOUNTER — Emergency Department (HOSPITAL_COMMUNITY)
Admission: EM | Admit: 2015-07-12 | Discharge: 2015-07-12 | Disposition: A | Discharge status: Home / Self Care | Payer: Medicaid Other | Attending: Emergency Medicine | Admitting: Emergency Medicine

## 2015-07-12 DIAGNOSIS — R569 Unspecified convulsions: Secondary | ICD-10-CM

## 2015-07-12 DIAGNOSIS — G40909 Epilepsy, unspecified, not intractable, without status epilepticus: Secondary | ICD-10-CM | POA: Insufficient documentation

## 2015-07-12 DIAGNOSIS — Z331 Pregnant state, incidental: Secondary | ICD-10-CM | POA: Insufficient documentation

## 2015-07-12 LAB — I-STAT CHEM 8, ED
BUN: 10 mg/dL (ref 6–20)
Calcium, Ion: 1.27 mmol/L — ABNORMAL HIGH (ref 1.12–1.23)
Chloride: 106 mmol/L (ref 101–111)
Creatinine, Ser: 1.1 mg/dL — ABNORMAL HIGH (ref 0.44–1.00)
GLUCOSE: 56 mg/dL — AB (ref 65–99)
HEMATOCRIT: 40 % (ref 36.0–46.0)
HEMOGLOBIN: 13.6 g/dL (ref 12.0–15.0)
POTASSIUM: 3.2 mmol/L — AB (ref 3.5–5.1)
SODIUM: 142 mmol/L (ref 135–145)
TCO2: 21 mmol/L (ref 0–100)

## 2015-07-12 LAB — I-STAT BETA HCG BLOOD, ED (MC, WL, AP ONLY): I-stat hCG, quantitative: 265.3 m[IU]/mL — ABNORMAL HIGH (ref <5)

## 2015-07-12 NOTE — ED Provider Notes (Signed)
CSN: [id number]     Arrival date & time 07/12/15  2008 History   First MD Initiated Contact with Patient 07/12/15 2010     Chief Complaint  Patient presents with  . Seizures     (Consider location/radiation/quality/duration/timing/severity/associated sxs/prior Treatment) HPI Olivia Kelly is a 29 y.o. female with a history of epilepsy, [facility] Sam, comes in for evaluation of seizure. Patient reports she has been taking her prescribed Topamax without any dosing changes. She reports earlier today while at work she had an unwitnessed seizure. She reports it felt like when she typically has a seizure. Her sister accompanied her bedside and states the patient will typically stare off into space, suddenly become rigid and immobile began convulsing on the floor as is her typical seizure pattern.  Past Medical History  Diagnosis Date  . Seizures (HCC)     last seizure 2015   Past Surgical History  Procedure Laterality Date  . Cystoscopy with retrograde pyelogram, ureteroscopy and stent placement Left 10/29/2014    Procedure: CYSTOSCOPY WITH LEFT RETROGRADE PYELOGRAM,  DIAGNOSTIC LEFT  URETEROSCOPY AND STENT PLACEMENT;  Surgeon: Sebastian Acheheodore Manny, MD;  [facility];  Service: Urology;  Laterality: Left;  . Cyst removed middle of chest at 154-477 years old    . Robot assisted pyeloplasty Left 01/23/2015    Procedure: ROBOTIC ASSISTED PYELOPLASTY WITH STENT PLACEMENT;  Surgeon: Sebastian Acheheodore Manny, MD;  Location: [facility];  Service: Urology;  Laterality: Left;  . Cystoscopy with retrograde pyelogram, ureteroscopy and stent placement Left 01/23/2015    Procedure: CYSTOSCOPY WITH LEFT RETROGRADE PYELOGRAM/URETERAL STENT PLACEMENT;  Surgeon: Sebastian Acheheodore Manny, MD;  Location: [facility];  Service: Urology;  Laterality: Left;  . Iud removal N/A 06/09/2015    Procedure: INTRAUTERINE DEVICE (IUD) REMOVAL;  Surgeon: Allie BossierMyra C Dove, MD;  Location: [facility];  Service:  Gynecology;  Laterality: N/A;   Family History  Problem Relation Age of Onset  . Diabetes Mother   . Hypertension Mother   . Diabetes Father   . Cancer Other    Social History  Substance Use Topics  . Smoking status: Never Smoker   . Smokeless tobacco: Never Used  . Alcohol Use: Yes     Comment: social   OB History    Gravida Para Term Preterm AB TAB SAB Ectopic Multiple Living   1 1 1  0 0 0 0 0 0 1     Review of Systems A 10 point review of systems was completed and was negative except for pertinent positives and negatives as mentioned in the history of present illness     Allergies  Review of patient's allergies indicates no known allergies.  Home Medications   Prior to Admission medications   Medication Sig Start Date End Date Taking? Authorizing Provider  acetaminophen (TYLENOL) 500 MG tablet Take 1,000 mg by mouth every 6 (six) hours as needed for moderate pain or headache.   Yes Historical Provider, MD  FOLIC ACID PO Take 1 tablet by mouth daily.   Yes Historical Provider, MD  Glycerin-Hypromellose-PEG 400 ([employer] DRY EYE RELIEF OP) Apply 2 drops to eye daily as needed.   Yes Historical Provider, MD  topiramate (TOPAMAX) 100 MG tablet Take 1 tablet (100 mg total) by mouth 2 (two) times daily. 09/20/14  Yes Sam Ethelda ChickJacubowitz, MD   BP 129/54 mmHg  Pulse 89  Temp(Src) 98.4 F (36.9 C) (Oral)  Resp 19  Ht 5\' 2"  (1.575 m)  Wt  126 lb (57.153 kg)  BMI 23.04 kg/m2  SpO2 99%  LMP 07/06/2015 Physical Exam  Constitutional: She is oriented to person, place, and time. She appears well-developed and well-nourished.  HENT:  Head: Normocephalic and atraumatic.  Mouth/Throat: Oropharynx is clear and moist.  Eyes: Conjunctivae are normal. Pupils are equal, round, and reactive to light. Right eye exhibits no discharge. Left eye exhibits no discharge. No scleral icterus.  Neck: Neck supple.  Cardiovascular: Normal rate, regular rhythm and normal heart sounds.   Pulmonary/Chest:  Effort normal and breath sounds normal. No respiratory distress. She has no wheezes. She has no rales.  Abdominal: Soft. There is no tenderness.  Musculoskeletal: She exhibits no tenderness.  Neurological: She is alert and oriented to person, place, and time.  Cranial Nerves II-XII grossly intact. Motor strength is intact 5/5. Sensation intact to light touch. Alert and oriented per baseline. Gait is baseline. No evidence of postictal state  Skin: Skin is warm and dry. No rash noted.  Psychiatric: She has a normal mood and affect.  Nursing note and vitals reviewed.   ED Course  Procedures (including critical care time) Labs Review Labs Reviewed  I-STAT BETA HCG BLOOD, ED ([facility], [facility], [facility] ONLY) - Abnormal; Notable for the following:    I-stat hCG, quantitative 265.3 (*)    All other components within normal limits  I-STAT CHEM 8, ED - Abnormal; Notable for the following:    Potassium 3.2 (*)    Creatinine, Ser 1.10 (*)    Glucose, Bld 56 (*)    Calcium, Ion 1.27 (*)    All other components within normal limits  TOPIRAMATE LEVEL    Imaging Review No results found. I have personally reviewed and evaluated these images and lab results as part of my medical decision-making.   EKG Interpretation None     Filed Vitals:   07/12/15 2013 07/12/15 2100 07/12/15 2230  BP: 114/62 93/53 129/54  Pulse: 92 94 89  Temp: 98.4 F (36.9 C)    TempSrc: Oral    Resp: Height:  (1.575 m)    Weight: 126 lb (57.153 kg)    SpO2: 100% 100% 99%    MDM  Olivia Kelly is a 29 y.o. female with a history of epilepsy on Topamax therapy comes in for evaluation of seizure. Seizure today was unwitnessed but patient feels it was typical of her normal seizure pattern. She is at baseline per her sister in the room. No postictal state. No obvious inciting events. No electrolyte abnormalities, hemoglobin appears stable. Incidentally, patient was found to be pregnant with a beta hCG of 265. No  abdominal pain or GYN complaints. She will be able to follow-up with [facility]. Discussed with [facility] neurology, Dr. Carlos Levering, recommends keeping the patient on current dose of Topamax and having patient follow up with Dr. Doreatha Martin in the clinic. Overall, patient appears well, nontoxic, hemodynamically stable with normal vital signs and is appropriate for discharge. Discharged in the care of her boyfriend. Prior to patient discharge, I discussed and reviewed this case with Dr. Dalene Seltzer  Final diagnoses:  Seizure [facility])        Joycie Peek, PA-C 07/13/15 0028  Alvira Monday, MD 07/13/15 1257

## 2015-07-12 NOTE — ED Notes (Signed)
Pt has epilepsy Hx.  Pt states she has not missed any meds.  She typically has one seizure a month and had none in October so she states that she was "due".  Pt did not feel well today and thought that she would have a seizure today.    BP 110/73 P 108 RR 14 CBG 96

## 2015-07-12 NOTE — Discharge Instructions (Signed)
There is not appear to be an emergent cause her symptoms at this time. It is important for you to continue taking her medication as prescribed. Please follow-up with your neurologist, Dr. Doreatha Martin in the next 2-3 days for reevaluation. It is also important to [facility] with [facility]. Return to ED for new or worsening symptoms.  Epilepsy Epilepsy is a disorder in which a person has repeated seizures over time. A seizure is a release of abnormal electrical activity in the brain. Seizures can cause a change in attention, behavior, or the ability to remain awake and alert (altered mental status). Seizures often involve uncontrollable shaking (convulsions).  Most people with epilepsy lead normal lives. However, people with epilepsy are at an increased risk of falls, accidents, and injuries. Therefore, it is important to begin treatment right away. CAUSES  Epilepsy has many possible causes. Anything that disturbs the normal pattern of brain cell activity can lead to seizures. This may include:   Head injury.  Birth trauma.  High fever as a child.  Stroke.  Bleeding into or around the brain.  Certain drugs.  Prolonged low oxygen, such as what occurs after CPR efforts.  Abnormal brain development.  Certain illnesses, such as meningitis, encephalitis (brain infection), malaria, and other infections.  An imbalance of nerve signaling chemicals (neurotransmitters).  SIGNS AND SYMPTOMS  The symptoms of a seizure can vary greatly from one person to another. Right before a seizure, you may have a warning (aura) that a seizure is about to occur. An aura may include the following symptoms:  Fear or anxiety.  Nausea.  Feeling like the room is spinning (vertigo).  Vision changes, such as seeing flashing lights or spots. Common symptoms during a seizure include:  Abnormal sensations, such as an abnormal smell or a bitter taste in the mouth.   Sudden, general  body stiffness.   Convulsions that involve rhythmic jerking of the face, arm, or leg on one or both sides.   Sudden change in consciousness.   Appearing to be awake but not responding.   Appearing to be asleep but cannot be awakened.   Grimacing, chewing, lip smacking, drooling, tongue biting, or loss of bowel or bladder control. After a seizure, you may feel sleepy for a while. DIAGNOSIS  Your health care provider will ask about your symptoms and take a medical history. Descriptions from any witnesses to your seizures will be very helpful in the diagnosis. A physical exam, including a detailed neurological exam, is necessary. Various tests may be done, such as:   An electroencephalogram (EEG). This is a painless test of your brain waves. In this test, a diagram is created of your brain waves. These diagrams can be interpreted by a specialist.  An MRI of the brain.   A CT scan of the brain.   A spinal tap (lumbar puncture, LP).  Blood tests to check for signs of infection or abnormal blood chemistry. TREATMENT  There is no cure for epilepsy, but it is generally treatable. Once epilepsy is diagnosed, it is important to begin treatment as soon as possible. For most people with epilepsy, seizures can be controlled with medicines. The following may also be used:  A pacemaker for the brain (vagus nerve stimulator) can be used for people with seizures that are not well controlled by medicine.  Surgery on the brain. For some people, epilepsy eventually goes away. HOME CARE INSTRUCTIONS   Follow your health care provider's recommendations on driving  and safety in normal activities.  Get enough rest. Lack of sleep can cause seizures.  Only take over-the-counter or prescription medicines as directed by your health care provider. Take any prescribed medicine exactly as directed.  Avoid any known triggers of your seizures.  Keep a seizure diary. Record what you recall about any  seizure, especially any possible trigger.   Make sure the people you live and work with know that you are prone to seizures. They should receive instructions on how to help you. In general, a witness to a seizure should:   Cushion your head and body.   Turn you on your side.   Avoid unnecessarily restraining you.   Not place anything inside your mouth.   Call for emergency medical help if there is any question about what has occurred.   Follow up with your health care provider as directed. You may need regular blood tests to monitor the levels of your medicine.  SEEK MEDICAL CARE IF:   You develop signs of infection or other illness. This might increase the risk of a seizure.   You seem to be having more frequent seizures.   Your seizure pattern is changing.  SEEK IMMEDIATE MEDICAL CARE IF:   You have a seizure that does not stop after a few moments.   You have a seizure that causes any difficulty in breathing.   You have a seizure that results in a very severe headache.   You have a seizure that leaves you with the inability to speak or use a part of your body.    This information is not intended to replace advice given to you by your health care provider. Make sure you discuss any questions you have with your health care provider.   Document Released: 08/22/2005 Document Revised: 06/12/2013 Document Reviewed: 04/03/2013 Elsevier Interactive Patient Education 2016 [employer].  Seizure, Adult A seizure is abnormal electrical activity in the brain. Seizures usually last from 30 seconds to 2 minutes. There are various types of seizures. Before a seizure, you may have a warning sensation (aura) that a seizure is about to occur. An aura may include the following symptoms:   Fear or anxiety.  Nausea.  Feeling like the room is spinning (vertigo).  Vision changes, such as seeing flashing lights or spots. Common symptoms during a seizure include:  A change  in attention or behavior (altered mental status).  Convulsions with rhythmic jerking movements.  Drooling.  Rapid eye movements.  Grunting.  Loss of bladder and bowel control.  Bitter taste in the mouth.  Tongue biting. After a seizure, you may feel confused and sleepy. You may also have an injury resulting from convulsions during the seizure. HOME CARE INSTRUCTIONS   If you are given medicines, take them exactly as prescribed by your health care provider.  Keep all follow-up appointments as directed by your health care provider.  Do not swim or drive or engage in risky activity during which a seizure could cause further injury to you or others until your health care provider says it is OK.  Get adequate rest.  Teach friends and family what to do if you have a seizure. They should:  Lay you on the ground to prevent a fall.  Put a cushion under your head.  Loosen any tight clothing around your neck.  Turn you on your side. If vomiting occurs, this helps keep your airway clear.  Stay with you until you recover.  Know whether or not you  need emergency care. SEEK IMMEDIATE MEDICAL CARE IF:  The seizure lasts longer than 5 minutes.  The seizure is severe or you do not wake up immediately after the seizure.  You have an altered mental status after the seizure.  You are having more frequent or worsening seizures. Someone should drive you to the emergency department or call local emergency services (911 in [place] MAKE SURE YOU:  Understand these instructions.  Will watch your condition.  Will get help right away if you are not doing well or get worse.   This information is not intended to replace advice given to you by your health care provider. Make sure you discuss any questions you have with your health care provider.   Document Released: 08/19/2000 Document Revised: 09/12/2014 Document Reviewed: 04/03/2013 Elsevier Interactive Patient Education [employer]2016 Elsevier  Inc.

## 2015-07-12 NOTE — ED Notes (Signed)
Bed: WA21 Expected date:  Expected time:  Means of arrival:  Comments: EMS 27F seizure

## 2015-07-12 NOTE — ED Notes (Signed)
All entries from 2011 to 2020 were entered by Jasper Rilingachel Ziare Cryder, RN

## 2015-07-14 LAB — TOPIRAMATE LEVEL: Topiramate Lvl: 6.7 ug/mL (ref 2.0–25.0)

## 2015-07-17 LAB — OB RESULTS CONSOLE HEPATITIS B SURFACE ANTIGEN: Hepatitis B Surface Ag: NEGATIVE

## 2015-07-17 LAB — OB RESULTS CONSOLE GC/CHLAMYDIA
CHLAMYDIA, DNA PROBE: NEGATIVE
GC PROBE AMP, GENITAL: NEGATIVE

## 2015-07-17 LAB — OB RESULTS CONSOLE ABO/RH: RH Type: POSITIVE

## 2015-07-17 LAB — OB RESULTS CONSOLE RUBELLA ANTIBODY, IGM: Rubella: IMMUNE

## 2015-07-17 LAB — OB RESULTS CONSOLE ANTIBODY SCREEN: ANTIBODY SCREEN: NEGATIVE

## 2015-07-17 LAB — OB RESULTS CONSOLE RPR: RPR: NONREACTIVE

## 2015-07-17 LAB — OB RESULTS CONSOLE HIV ANTIBODY (ROUTINE TESTING): HIV: NONREACTIVE

## 2015-08-02 ENCOUNTER — Emergency Department (HOSPITAL_COMMUNITY)
Admission: EM | Admit: 2015-08-02 | Discharge: 2015-08-02 | Disposition: A | Discharge status: Home / Self Care | Payer: Medicaid Other | Attending: Emergency Medicine | Admitting: Emergency Medicine

## 2015-08-02 ENCOUNTER — Encounter (HOSPITAL_COMMUNITY): Payer: Self-pay | Admitting: Emergency Medicine

## 2015-08-02 DIAGNOSIS — Z3A01 Less than 8 weeks gestation of pregnancy: Secondary | ICD-10-CM | POA: Diagnosis not present

## 2015-08-02 DIAGNOSIS — Z79899 Other long term (current) drug therapy: Secondary | ICD-10-CM | POA: Diagnosis not present

## 2015-08-02 DIAGNOSIS — O200 Threatened abortion: Secondary | ICD-10-CM | POA: Insufficient documentation

## 2015-08-02 DIAGNOSIS — O209 Hemorrhage in early pregnancy, unspecified: Secondary | ICD-10-CM | POA: Diagnosis present

## 2015-08-02 LAB — POC URINE PREG, ED: PREG TEST UR: POSITIVE — AB

## 2015-08-02 LAB — WET PREP, GENITAL
Clue Cells Wet Prep HPF POC: NONE SEEN
SPERM: NONE SEEN
Trich, Wet Prep: NONE SEEN
YEAST WET PREP: NONE SEEN

## 2015-08-02 LAB — URINE MICROSCOPIC-ADD ON: RBC / HPF: NONE SEEN RBC/hpf (ref 0–5)

## 2015-08-02 LAB — I-STAT BETA HCG BLOOD, ED (MC, WL, AP ONLY): I-stat hCG, quantitative: >2000 m[IU]/mL — ABNORMAL HIGH (ref <5)

## 2015-08-02 LAB — HCG, QUANTITATIVE, PREGNANCY: hCG, Beta Chain, Quant, S: 156923 m[IU]/mL — ABNORMAL HIGH (ref <5)

## 2015-08-02 LAB — URINALYSIS, ROUTINE W REFLEX MICROSCOPIC
Bilirubin Urine: NEGATIVE
GLUCOSE, UA: NEGATIVE mg/dL
KETONES UR: 40 mg/dL — AB
LEUKOCYTES UA: NEGATIVE
Nitrite: NEGATIVE
PROTEIN: NEGATIVE mg/dL
Specific Gravity, Urine: 1.013 (ref 1.005–1.030)
pH: 6.5 (ref 5.0–8.0)

## 2015-08-02 LAB — HIV ANTIBODY (ROUTINE TESTING W REFLEX): HIV Screen 4th Generation wRfx: NONREACTIVE

## 2015-08-02 LAB — ABO/RH: ABO/RH(D): B POS

## 2015-08-02 NOTE — ED Provider Notes (Signed)
CSN: [id number]     Arrival date & time 08/02/15  0101 History   First MD Initiated Contact with Patient 08/02/15 0448     Chief Complaint  Patient presents with  . Vaginal Bleeding     (Consider location/radiation/quality/duration/timing/severity/associated sxs/prior Treatment) Patient is a 29 y.o. female presenting with vaginal bleeding. The history is provided by the patient. No language interpreter was used.  Vaginal Bleeding Quality:  Dark red Associated symptoms: abdominal pain   Associated symptoms: no back pain, no dysuria, no fever and no nausea   Associated symptoms comment:  Patient complains of vaginal spotting that started yesterday, and became worse today. She reports she is pregnant in early pregnancy. G2P1, previous full term delivery after uncomplicated pregnancy. She states she is having minimal lower abdominal cramping. No back pain. No urinary symptoms.    Past Medical History  Diagnosis Date  . Seizures (HCC)     last seizure 2015   Past Surgical History  Procedure Laterality Date  . Cystoscopy with retrograde pyelogram, ureteroscopy and stent placement Left 10/29/2014    Procedure: CYSTOSCOPY WITH LEFT RETROGRADE PYELOGRAM,  DIAGNOSTIC LEFT  URETEROSCOPY AND STENT PLACEMENT;  Surgeon: Sebastian Ache, MD;  Location: [facility];  Service: Urology;  Laterality: Left;  . Cyst removed middle of chest at 84-91 years old    . Robot assisted pyeloplasty Left 01/23/2015    Procedure: ROBOTIC ASSISTED PYELOPLASTY WITH STENT PLACEMENT;  Surgeon: Sebastian Ache, MD;  Location: [facility];  Service: Urology;  Laterality: Left;  . Cystoscopy with retrograde pyelogram, ureteroscopy and stent placement Left 01/23/2015    Procedure: CYSTOSCOPY WITH LEFT RETROGRADE PYELOGRAM/URETERAL STENT PLACEMENT;  Surgeon: Sebastian Ache, MD;  Location: [facility];  Service: Urology;  Laterality: Left;  . Iud removal N/A 06/09/2015    Procedure: INTRAUTERINE DEVICE (IUD) REMOVAL;  Surgeon:  Allie Bossier, MD;  Location: [facility];  Service: Gynecology;  Laterality: N/A;   Family History  Problem Relation Age of Onset  . Diabetes Mother   . Hypertension Mother   . Diabetes Father   . Cancer Other    Social History  Substance Use Topics  . Smoking status: Never Smoker   . Smokeless tobacco: Never Used  . Alcohol Use: Yes     Comment: social   OB History    Gravida Para Term Preterm AB TAB SAB Ectopic Multiple Living   0 0 0 0 0 0 1     Review of Systems  Constitutional: Negative for fever and chills.  Gastrointestinal: Positive for abdominal pain. Negative for nausea and vomiting.  Genitourinary: Positive for vaginal bleeding. Negative for dysuria.  Musculoskeletal: Negative.  Negative for back pain.  Skin: Negative.   Neurological: Negative.       Allergies  Review of patient's allergies indicates no known allergies.  Home Medications   Prior to Admission medications   Medication Sig Start Date End Date Taking? Authorizing Provider  Glycerin-Hypromellose-PEG 400 ([facility] DRY EYE RELIEF OP) Apply 2 drops to eye daily as needed.   Yes Historical Provider, MD  nitrofurantoin, macrocrystal-monohydrate, (MACROBID) 100 MG capsule Take 100 mg by mouth 2 (two) times daily.   Yes Historical Provider, MD  Prenatal Vit-Fe Fumarate-FA (MULTIVITAMIN-PRENATAL) 27-0.8 MG TABS tablet Take 1 tablet by mouth daily at 12 noon.   Yes Historical Provider, MD  topiramate (TOPAMAX) 100 MG tablet Take 1 tablet (100 mg total) by mouth 2 (two) times daily. 09/20/14  Yes Doug Sou, MD  BP 121/74 mmHg  Pulse 73  Temp(Src) 98 F (36.7 C) (Oral)  Resp 16  Ht 5\' 2"  (1.575 m)  Wt 58.06 kg  BMI 23.41 kg/m2  SpO2 100%  LMP 06/04/2015 Physical Exam  Constitutional: She is oriented to person, place, and time. She appears well-developed and well-nourished.  Neck: Normal range of motion.  Pulmonary/Chest: Effort normal.  Abdominal: There is no tenderness.  Genitourinary:  Dark  blood with yellow discharge in vaginal vault. No CMT, adnexal mass or tenderness.   Neurological: She is alert and oriented to person, place, and time.  Skin: Skin is warm and dry.  Psychiatric: She has a normal mood and affect.    ED Course  Procedures (including critical care time) Labs Review Labs Reviewed  WET PREP, GENITAL - Abnormal; Notable for the following:    WBC, Wet Prep HPF POC FEW (*)    All other components within normal limits  POC URINE PREG, ED - Abnormal; Notable for the following:    Preg Test, Ur POSITIVE (*)    All other components within normal limits  HIV ANTIBODY (ROUTINE TESTING)  URINALYSIS, ROUTINE W REFLEX MICROSCOPIC (NOT AT [facility])  I-STAT BETA HCG BLOOD, ED ([facility], [facility], [facility] ONLY)  ABO/RH  GC/CHLAMYDIA PROBE AMP () NOT AT [facility]   Results for orders placed or performed during the hospital encounter of 08/02/15  Wet prep, genital  Result Value Ref Range   Yeast Wet Prep HPF POC NONE SEEN NONE SEEN   Trich, Wet Prep NONE SEEN NONE SEEN   Clue Cells Wet Prep HPF POC NONE SEEN NONE SEEN   WBC, Wet Prep HPF POC FEW (A) NONE SEEN   Sperm NONE SEEN   POC urine preg, ED (not at [facility])  Result Value Ref Range   Preg Test, Ur POSITIVE (A) NEGATIVE  I-Stat beta hCG blood, ED  Result Value Ref Range   I-stat hCG, quantitative >2000.0 (H) <5 mIU/mL   Comment 3          ABO/Rh  Result Value Ref Range   ABO/RH(D) B POS     Imaging Review No results found. I have personally reviewed and evaluated these images and lab results as part of my medical decision-making.   EKG Interpretation None      MDM   Final diagnoses:  None    1. Threatened abortion  The patient has a small amount of dark vaginal blood without cervical opening or cervical motion or adnexal tenderness. She is comfortable appearing. She has started prenatal care and has appropriate follow up. Ultrasound considered and discussed with Dr. Rhunette CroftNanavati. Given non-tender bimanual exam and  closed cervical os, ultrasound not obtained. Encouraged close follow up with OB in 1-2 days.     Elpidio AnisShari Nur Rabold, PA-C 08/02/15 [phone]  Derwood KaplanAnkit Nanavati, MD 08/02/15 [phone]

## 2015-08-02 NOTE — ED Provider Notes (Signed)
  Physical Exam  BP 121/74 mmHg  Pulse 73  Temp(Src) 98 F (36.7 C) (Oral)  Resp 16  Ht 5\' 2"  (1.575 m)  Wt 58.06 kg  BMI 23.41 kg/m2  SpO2 100%  LMP 06/04/2015  Physical Exam  ED Course  Procedures  MDM Pt sign out to me at beginning of shift.  Pt here with vaginal bleeding concerning for threatening miscarriage as pt is pregnant.  I was requested to f/u on UA result.  Her UA did result and showing no evidence of UTI.  Therefore, pt stable for discharge with outpt f/u.        Fayrene HelperBowie Audry Pecina, PA-C 08/02/15 [phone]

## 2015-08-02 NOTE — ED Notes (Signed)
Pt c/o brown vaginal bleeding/discharge. Pt reports she is 6-7 weeks preg. Minor low abd cramping.

## 2015-08-02 NOTE — Discharge Instructions (Signed)

## 2015-08-03 ENCOUNTER — Other Ambulatory Visit: Payer: Self-pay | Admitting: Certified Nurse Midwife

## 2015-08-03 DIAGNOSIS — N6451 Induration of breast: Secondary | ICD-10-CM

## 2015-08-03 LAB — GC/CHLAMYDIA PROBE AMP (~~LOC~~) NOT AT ARMC
CHLAMYDIA, DNA PROBE: NEGATIVE
Neisseria Gonorrhea: NEGATIVE

## 2015-08-06 ENCOUNTER — Ambulatory Visit (HOSPITAL_COMMUNITY)
Admission: RE | Admit: 2015-08-06 | Discharge: 2015-08-06 | Disposition: A | Discharge status: Home / Self Care | Payer: Medicaid Other | Source: Ambulatory Visit | Attending: Certified Nurse Midwife | Admitting: Certified Nurse Midwife

## 2015-08-06 ENCOUNTER — Encounter (HOSPITAL_COMMUNITY): Payer: Self-pay

## 2015-08-06 VITALS — BP 111/66 | HR 82 | Wt 132.0 lb

## 2015-08-06 DIAGNOSIS — O99352 Diseases of the nervous system complicating pregnancy, second trimester: Secondary | ICD-10-CM | POA: Insufficient documentation

## 2015-08-06 DIAGNOSIS — G40909 Epilepsy, unspecified, not intractable, without status epilepticus: Secondary | ICD-10-CM | POA: Diagnosis not present

## 2015-08-06 DIAGNOSIS — Z3A00 Weeks of gestation of pregnancy not specified: Secondary | ICD-10-CM | POA: Diagnosis not present

## 2015-08-07 ENCOUNTER — Other Ambulatory Visit: Payer: Medicaid Other

## 2015-08-13 ENCOUNTER — Ambulatory Visit
Admission: RE | Admit: 2015-08-13 | Discharge: 2015-08-13 | Disposition: A | Discharge status: Home / Self Care | Payer: Medicaid Other | Source: Ambulatory Visit | Attending: Certified Nurse Midwife | Admitting: Certified Nurse Midwife

## 2015-08-13 DIAGNOSIS — N6451 Induration of breast: Secondary | ICD-10-CM

## 2015-08-24 ENCOUNTER — Telehealth (HOSPITAL_COMMUNITY): Payer: Self-pay | Admitting: *Deleted

## 2015-08-24 NOTE — Telephone Encounter (Signed)
Pt called regarding questions about seizure medication.  Pt name and DOB verified.   RN spoke with Dr. Claudean SeveranceWhitecar, his recommendation is for the patient to consult her neurologist regarding seizure medication.  [facility] and voiced understanding.  Pt had no further questions.

## 2015-09-06 NOTE — L&D Delivery Note (Signed)
The FHT started showing recurrent decels with pushing and fetal tachycardia.  She was afebrile.  The mother was clearly exhausted at this point.  She had been pushing for 3hrs with dense epidural.  I discussed options which included a c-section and VAVD.  The risks, benefits and alternatives of each were reviewed which included bleeding, infection and injury and cephalohematoma and cerebral hemorrhage.  The patient and FOB wanted to try the vacuum.  I placed the vacuum on fetal head which was at +2 station with pushing.  The pelvis clinically felt adequate.  The vacuum was pumped to a pressure of (green zone) and with one push the fetal head was brought to the introitus but the vacuum popped off.  The vacuum was not replaced and with the next contraction, the fetus was delivered without difficulty with pushing.  The placenta delivered spontaneously intact with 3V cord.  EBL 400cc.  Apgars 7 at one minute and 8 at 5 minutes.  Bilateral labial lacerations and one superior to the urethra was noted and repaired with 3-0 vicryl.  The baby was left with mom doing skin to skin.

## 2015-09-13 MED FILL — TOPIRAMATE 100 MG TABLET: 100 | 30 days supply | Qty: 60 | Fill #9

## 2015-10-15 ENCOUNTER — Encounter (HOSPITAL_COMMUNITY): Payer: Self-pay

## 2015-10-15 ENCOUNTER — Other Ambulatory Visit (HOSPITAL_COMMUNITY): Payer: Self-pay | Admitting: Maternal and Fetal Medicine

## 2015-10-15 ENCOUNTER — Ambulatory Visit (HOSPITAL_COMMUNITY)
Admission: RE | Admit: 2015-10-15 | Discharge: 2015-10-15 | Disposition: A | Discharge status: Home / Self Care | Payer: Medicaid Other | Source: Ambulatory Visit | Attending: Certified Nurse Midwife | Admitting: Certified Nurse Midwife

## 2015-10-15 DIAGNOSIS — Z3A17 17 weeks gestation of pregnancy: Secondary | ICD-10-CM | POA: Insufficient documentation

## 2015-10-15 DIAGNOSIS — Z3689 Encounter for other specified antenatal screening: Secondary | ICD-10-CM

## 2015-10-15 DIAGNOSIS — G40909 Epilepsy, unspecified, not intractable, without status epilepticus: Secondary | ICD-10-CM

## 2015-10-15 DIAGNOSIS — O99352 Diseases of the nervous system complicating pregnancy, second trimester: Secondary | ICD-10-CM

## 2015-10-15 DIAGNOSIS — O99350 Diseases of the nervous system complicating pregnancy, unspecified trimester: Secondary | ICD-10-CM | POA: Insufficient documentation

## 2015-10-19 MED FILL — TOPIRAMATE 100 MG TABLET: 100 | 30 days supply | Qty: 60 | Fill #0

## 2015-11-19 MED FILL — TOPIRAMATE 100 MG TABLET: 100 | 30 days supply | Qty: 60 | Fill #1 | Status: TO

## 2016-01-04 MED FILL — TOPIRAMATE 100 MG TABLET: 100 | 30 days supply | Qty: 60 | Fill #0

## 2016-02-11 MED FILL — TOPIRAMATE 100 MG TABLET: 100 | 30 days supply | Qty: 60 | Fill #1 | Status: TO

## 2016-03-03 LAB — OB RESULTS CONSOLE GBS: STREP GROUP B AG: NEGATIVE

## 2016-03-07 ENCOUNTER — Other Ambulatory Visit: Payer: Self-pay | Admitting: Obstetrics and Gynecology

## 2016-03-07 ENCOUNTER — Telehealth (HOSPITAL_COMMUNITY): Payer: Self-pay | Admitting: *Deleted

## 2016-03-07 ENCOUNTER — Encounter (HOSPITAL_COMMUNITY): Payer: Self-pay | Admitting: *Deleted

## 2016-03-07 NOTE — Telephone Encounter (Signed)
Preadmission screen  

## 2016-03-09 ENCOUNTER — Observation Stay (HOSPITAL_COMMUNITY)
Admission: RE | Admit: 2016-03-09 | Discharge: 2016-03-09 | Disposition: A | Discharge status: Home / Self Care | Payer: Medicaid Other | Source: Ambulatory Visit | Attending: Obstetrics and Gynecology | Admitting: Obstetrics and Gynecology

## 2016-03-09 ENCOUNTER — Observation Stay (HOSPITAL_COMMUNITY): Payer: Medicaid Other

## 2016-03-09 ENCOUNTER — Encounter (HOSPITAL_COMMUNITY): Payer: Self-pay

## 2016-03-09 DIAGNOSIS — G40909 Epilepsy, unspecified, not intractable, without status epilepticus: Secondary | ICD-10-CM | POA: Insufficient documentation

## 2016-03-09 DIAGNOSIS — O99353 Diseases of the nervous system complicating pregnancy, third trimester: Principal | ICD-10-CM | POA: Insufficient documentation

## 2016-03-09 DIAGNOSIS — Z3A37 37 weeks gestation of pregnancy: Secondary | ICD-10-CM | POA: Diagnosis not present

## 2016-03-09 DIAGNOSIS — Z36 Encounter for antenatal screening of mother: Secondary | ICD-10-CM | POA: Insufficient documentation

## 2016-03-09 DIAGNOSIS — Z3490 Encounter for supervision of normal pregnancy, unspecified, unspecified trimester: Secondary | ICD-10-CM

## 2016-03-09 NOTE — Discharge Instructions (Signed)
Natural Childbirth °Natural childbirth is going through labor and delivery without any drugs to relieve pain. You also do not use fetal monitors, have a cesarean delivery, or get a sugical cut to enlarge the vaginal opening (episiotomy). With the help of a birthing professional (midwife), you will direct your own labor and delivery as you choose. °Many women chose natural childbirth because they feel more in control and in touch with their labor and delivery. They are also concerned about the medications affecting themselves and the baby. °Pregnant women with a high risk pregnancy should not attempt natural childbirth. It is better to deliver the infant in a hospital if an emergency situation arises. Sometimes, the caregiver has to intervene for the health and safety of the mother and infant. °TWO TECHNIQUES FOR NATURAL CHILDBIRTH:  °· The Lamaze method. This method teaches women that having a baby is normal, healthy, and natural. It also teaches the mother to take a neutral position regarding pain medication and anesthesia and to make an informed decision if and when it is right for them. °· The Bradley method (also called husband coached birth). This method teaches the father to be the birth coach and stresses a natural approach. It also encourages exercise and a balanced diet with good nutrition. The exercises teach relaxation and deep breathing techniques. However, there are also classes to prepare the parents for an emergency situation that may occur. °METHODS OF DEALING WITH LABOR PAIN AND DELIVERY: °· Meditation. °· Yoga. °· Hypnosis. °· Acupuncture. °· Massage. °· Changing positions (walking, rocking, showering, leaning on birth balls). °· Lying in warm water or a jacuzzi. °· Find an activity that keeps your mind off of the labor pain. °· Listen to soft music. °· Visual imagery (focus on a particular object). °BEFORE GOING INTO LABOR °· Be sure you and your spouse/partner are in agreement to have natural  childbirth. °· Decide if your caregiver or a midwife will deliver your baby. °· Decide if you will have your baby in the hospital, birthing center, or at home. °· If you have children, make plans to have someone to take care of them when you go to the hospital. °· Know the distance and the time it takes to go to the delivery center. Make a dry run to be sure. °· Have a bag packed with a night gown, bathrobe, and toiletries ready to take when you go into labor. °· Keep phone numbers of your family and friends handy if you need to call someone when you go into labor. °· Your spouse or partner should go to all the teaching classes. °· Talk with your caregiver about the possibility of a medical emergency and what will happen if that occurs. °ADVANTAGES OF NATURAL CHILDBIRTH °· You are in control of your labor and delivery. °· It is safe. °· There are no medications or anesthetics that may affect you and the fetus. °· There are no invasive procedures such as an episiotomy. °· You and your partner will work together, which can increase your bond. °· Meditation, yoga, massage, and breathing exercises can be learned while pregnant and help you when you are in labor and at delivery. °· In most delivery centers, the family and friends can be involved in the labor and delivery process. °DISADVANTAGES OF NATURAL CHILDBIRTH °· You will experience pain during your labor and delivery. °· The methods of helping relieve your labor pains may not work for you. °· You may feel embarrassed, disappointed, and like a failure   if you decide to change your mind during labor and not have natural childbirth. AFTER THE DELIVERY  You will be very tired.  You will be uncomfortable because of your uterus contracting. You will feel soreness around the vagina.  You may feel cold and shaky.This is a natural reaction.  You will be excited, overwhelmed, accomplished, and proud to be a mother. HOME CARE INSTRUCTIONS   Follow the advice and  instructions of your caregiver.  Follow the instructions of your natural childbirth instructor (Lamaze or Bradley Method).   This information is not intended to replace advice given to you by your health care provider. Make sure you discuss any questions you have with your health care provider.   Document Released: 08/04/2008 Document Revised: 11/14/2011 Document Reviewed: 04/29/2013 Elsevier Interactive Patient Education 2016 Elsevier Inc. Fetal Movement Counts Patient Name: __________________________________________________ Patient Due Date: ____________________ Performing a fetal movement count is highly recommended in high-risk pregnancies, but it is good for every pregnant woman to do. Your health care provider may ask you to start counting fetal movements at 28 weeks of the pregnancy. Fetal movements often increase:  After eating a full meal.  After physical activity.  After eating or drinking something sweet or cold.  At rest. Pay attention to when you feel the baby is most active. This will help you notice a pattern of your baby's sleep and wake cycles and what factors contribute to an increase in fetal movement. It is important to perform a fetal movement count at the same time each day when your baby is normally most active.  HOW TO COUNT FETAL MOVEMENTS  Find a quiet and comfortable area to sit or lie down on your left side. Lying on your left side provides the best blood and oxygen circulation to your baby.  Write down the day and time on a sheet of paper or in a journal.  Start counting kicks, flutters, swishes, rolls, or jabs in a 2-hour period. You should feel at least 10 movements within 2 hours.  If you do not feel 10 movements in 2 hours, wait 2-3 hours and count again. Look for a change in the pattern or not enough counts in 2 hours. SEEK MEDICAL CARE IF:  You feel less than 10 counts in 2 hours, tried twice.  There is no movement in over an hour.  The pattern  is changing or taking longer each day to reach 10 counts in 2 hours.  You feel the baby is not moving as he or she usually does. Date: ____________ Movements: ____________ Start time: ____________ Doreatha MartinFinish time: ____________  Date: ____________ Movements: ____________ Start time: ____________ Doreatha MartinFinish time: ____________ Date: ____________ Movements: ____________ Start time: ____________ Doreatha MartinFinish time: ____________ Date: ____________ Movements: ____________ Start time: ____________ Doreatha MartinFinish time: ____________ Date: ____________ Movements: ____________ Start time: ____________ Doreatha MartinFinish time: ____________ Date: ____________ Movements: ____________ Start time: ____________ Doreatha MartinFinish time: ____________ Date: ____________ Movements: ____________ Start time: ____________ Doreatha MartinFinish time: ____________ Date: ____________ Movements: ____________ Start time: ____________ Doreatha MartinFinish time: ____________  Date: ____________ Movements: ____________ Start time: ____________ Doreatha MartinFinish time: ____________ Date: ____________ Movements: ____________ Start time: ____________ Doreatha MartinFinish time: ____________ Date: ____________ Movements: ____________ Start time: ____________ Doreatha MartinFinish time: ____________ Date: ____________ Movements: ____________ Start time: ____________ Doreatha MartinFinish time: ____________ Date: ____________ Movements: ____________ Start time: ____________ Doreatha MartinFinish time: ____________ Date: ____________ Movements: ____________ Start time: ____________ Doreatha MartinFinish time: ____________ Date: ____________ Movements: ____________ Start time: ____________ Doreatha MartinFinish time: ____________  Date: ____________ Movements: ____________ Start time: ____________ Doreatha MartinFinish time: ____________ Date:  ____________ Movements: ____________ Start time: ____________ Doreatha MartinFinish time: ____________ Date: ____________ Movements: ____________ Start time: ____________ Doreatha MartinFinish time: ____________ Date: ____________ Movements: ____________ Start time: ____________ Doreatha MartinFinish time: ____________ Date:  ____________ Movements: ____________ Start time: ____________ Doreatha MartinFinish time: ____________ Date: ____________ Movements: ____________ Start time: ____________ Doreatha MartinFinish time: ____________ Date: ____________ Movements: ____________ Start time: ____________ Doreatha MartinFinish time: ____________  Date: ____________ Movements: ____________ Start time: ____________ Doreatha MartinFinish time: ____________ Date: ____________ Movements: ____________ Start time: ____________ Doreatha MartinFinish time: ____________ Date: ____________ Movements: ____________ Start time: ____________ Doreatha MartinFinish time: ____________ Date: ____________ Movements: ____________ Start time: ____________ Doreatha MartinFinish time: ____________ Date: ____________ Movements: ____________ Start time: ____________ Doreatha MartinFinish time: ____________ Date: ____________ Movements: ____________ Start time: ____________ Doreatha MartinFinish time: ____________ Date: ____________ Movements: ____________ Start time: ____________ Doreatha MartinFinish time: ____________  Date: ____________ Movements: ____________ Start time: ____________ Doreatha MartinFinish time: ____________ Date: ____________ Movements: ____________ Start time: ____________ Doreatha MartinFinish time: ____________ Date: ____________ Movements: ____________ Start time: ____________ Doreatha MartinFinish time: ____________ Date: ____________ Movements: ____________ Start time: ____________ Doreatha MartinFinish time: ____________ Date: ____________ Movements: ____________ Start time: ____________ Doreatha MartinFinish time: ____________ Date: ____________ Movements: ____________ Start time: ____________ Doreatha MartinFinish time: ____________ Date: ____________ Movements: ____________ Start time: ____________ Doreatha MartinFinish time: ____________  Date: ____________ Movements: ____________ Start time: ____________ Doreatha MartinFinish time: ____________ Date: ____________ Movements: ____________ Start time: ____________ Doreatha MartinFinish time: ____________ Date: ____________ Movements: ____________ Start time: ____________ Doreatha MartinFinish time: ____________ Date: ____________ Movements: ____________ Start  time: ____________ Doreatha MartinFinish time: ____________ Date: ____________ Movements: ____________ Start time: ____________ Doreatha MartinFinish time: ____________ Date: ____________ Movements: ____________ Start time: ____________ Doreatha MartinFinish time: ____________ Date: ____________ Movements: ____________ Start time: ____________ Doreatha MartinFinish time: ____________  Date: ____________ Movements: ____________ Start time: ____________ Doreatha MartinFinish time: ____________ Date: ____________ Movements: ____________ Start time: ____________ Doreatha MartinFinish time: ____________ Date: ____________ Movements: ____________ Start time: ____________ Doreatha MartinFinish time: ____________ Date: ____________ Movements: ____________ Start time: ____________ Doreatha MartinFinish time: ____________ Date: ____________ Movements: ____________ Start time: ____________ Doreatha MartinFinish time: ____________ Date: ____________ Movements: ____________ Start time: ____________ Doreatha MartinFinish time: ____________ Date: ____________ Movements: ____________ Start time: ____________ Doreatha MartinFinish time: ____________  Date: ____________ Movements: ____________ Start time: ____________ Doreatha MartinFinish time: ____________ Date: ____________ Movements: ____________ Start time: ____________ Doreatha MartinFinish time: ____________ Date: ____________ Movements: ____________ Start time: ____________ Doreatha MartinFinish time: ____________ Date: ____________ Movements: ____________ Start time: ____________ Doreatha MartinFinish time: ____________ Date: ____________ Movements: ____________ Start time: ____________ Doreatha MartinFinish time: ____________ Date: ____________ Movements: ____________ Start time: ____________ Doreatha MartinFinish time: ____________   This information is not intended to replace advice given to you by your health care provider. Make sure you discuss any questions you have with your health care provider.   Document Released: 09/21/2006 Document Revised: 09/12/2014 Document Reviewed: 06/18/2012 Elsevier Interactive Patient Education [employer]2016 Elsevier Inc.

## 2016-03-22 MED FILL — TOPIRAMATE 100 MG TABLET: 100 | 30 days supply | Qty: 60 | Fill #0 | Status: TO

## 2016-03-25 ENCOUNTER — Telehealth (HOSPITAL_COMMUNITY): Payer: Self-pay | Admitting: *Deleted

## 2016-03-25 NOTE — Telephone Encounter (Signed)
Preadmission screen  

## 2016-03-30 ENCOUNTER — Other Ambulatory Visit: Payer: Self-pay | Admitting: Obstetrics and Gynecology

## 2016-03-30 DIAGNOSIS — Z113 Encounter for screening for infections with a predominantly sexual mode of transmission: Secondary | ICD-10-CM

## 2016-03-30 NOTE — H&P (Signed)
  Olivia Kelly is a 30 y.o. female,G2P1 presenting at 41 weeks for induction of labor.She reports good fetal movement. She denies LOF or bleeding.  Pregnancy followed at [facility] since 6  weeks and remarkable for:  1. Seizure disorder. Patient on Topamax 100 mg BID 2. Unstable lie in 3rd trimester 3. 2008 SVD 7 lbs 12 oz  OB History    Gravida Para Term Preterm AB Living   2 1 1  0 0 1   SAB TAB Ectopic Multiple Live Births   0 0 0 0 1     Past Medical History:  Diagnosis Date  . Seizures (HCC)    last seizure 2015, on meds   Past Surgical History:  Procedure Laterality Date  . cyst removed middle of chest at 25-48 years old    . CYSTOSCOPY WITH RETROGRADE PYELOGRAM, URETEROSCOPY AND STENT PLACEMENT Left 10/29/2014   Procedure: CYSTOSCOPY WITH LEFT RETROGRADE PYELOGRAM,  DIAGNOSTIC LEFT  URETEROSCOPY AND STENT PLACEMENT;  Surgeon: Sebastian Ache, MD;  Location: [facility];  Service: Urology;  Laterality: Left;  . CYSTOSCOPY WITH RETROGRADE PYELOGRAM, URETEROSCOPY AND STENT PLACEMENT Left 01/23/2015   Procedure: CYSTOSCOPY WITH LEFT RETROGRADE PYELOGRAM/URETERAL STENT PLACEMENT;  Surgeon: Sebastian Ache, MD;  Location: [facility];  Service: Urology;  Laterality: Left;  . IUD REMOVAL N/A 06/09/2015   Procedure: INTRAUTERINE DEVICE (IUD) REMOVAL;  Surgeon: Allie Bossier, MD;  Location: [facility];  Service: Gynecology;  Laterality: N/A;  . NEPHRECTOMY     half of left kidney removed  . ROBOT ASSISTED PYELOPLASTY Left 01/23/2015   Procedure: ROBOTIC ASSISTED PYELOPLASTY WITH STENT PLACEMENT;  Surgeon: Sebastian Ache, MD;  Location: [facility];  Service: Urology;  Laterality: Left;    Family History:   family history includes Cancer in her other; Diabetes in her father and mother; Hypertension in her mother. Social History:    reports that she has never smoked. She has never used smokeless tobacco. She reports that she drinks alcohol. She reports that she does not use  drugs.   Prenatal labs: ABO, Rh: --/--/B POS (11/27 0543) Antibody: Negative (11/11 0000) Rubella: immune RPR: Nonreactive (11/11 0000)  HBsAg: Negative (11/11 0000)  HIV: Non Reactive (11/27 0543)  GBS:   negative   Prenatal Transfer Tool  Maternal Diabetes: No Genetic Screening: Normal Maternal Ultrasounds/Referrals: Normal Fetal Ultrasounds or other Referrals:  None Maternal Substance Abuse:  No Significant Maternal Medications:  Meds include: Other: Topamax for seizure disorder Significant Maternal Lab Results: None     Last menstrual period 06/04/2015, unknown if currently breastfeeding.  General Appearance: Alert, appropriate appearance for age. No acute distress HEENT Exam: Grossly normal Chest/Respiratory Exam: Normal chest wall and respirations. Clear to auscultation  Cardiovascular Exam: Regular rate and rhythm. S1, S2, no murmur Gastrointestinal Exam: soft, non-tender, Uterus gravid with size compatible with GA, Vertex presentation by Leopold's maneuvers Psychiatric Exam: Alert and oriented, appropriate affect  ++++++++++++++++++++++++++++++++++++++++++++++++++++++++++++++++  Vaginal exam: 1/90/-2 vertex. Foley bulb easily inserted and inflated with 60 cc saline  Fetal tracings: Category 1  ++++++++++++++++++++++++++++++++++++++++++++++++++++++++++++++++   Assessment/Plan:  SIUP at 41 weeks for IOL SVD expected Foley bulb and Pitocin   Silverio Lay MD 03/30/2016, 7:50 PM

## 2016-03-31 ENCOUNTER — Inpatient Hospital Stay (HOSPITAL_COMMUNITY): Payer: Medicaid Other | Admitting: Anesthesiology

## 2016-03-31 ENCOUNTER — Encounter (HOSPITAL_COMMUNITY): Payer: Self-pay

## 2016-03-31 ENCOUNTER — Inpatient Hospital Stay (HOSPITAL_COMMUNITY)
Admission: RE | Admit: 2016-03-31 | Discharge: 2016-04-03 | DRG: 775 | Disposition: A | Discharge status: Home / Self Care | Payer: Medicaid Other | Source: Ambulatory Visit | Attending: Obstetrics and Gynecology | Admitting: Obstetrics and Gynecology

## 2016-03-31 DIAGNOSIS — O99354 Diseases of the nervous system complicating childbirth: Secondary | ICD-10-CM | POA: Diagnosis present

## 2016-03-31 DIAGNOSIS — R569 Unspecified convulsions: Secondary | ICD-10-CM

## 2016-03-31 DIAGNOSIS — O480 Post-term pregnancy: Secondary | ICD-10-CM | POA: Diagnosis present

## 2016-03-31 DIAGNOSIS — Z3A41 41 weeks gestation of pregnancy: Secondary | ICD-10-CM | POA: Diagnosis not present

## 2016-03-31 DIAGNOSIS — D62 Acute posthemorrhagic anemia: Secondary | ICD-10-CM | POA: Diagnosis not present

## 2016-03-31 DIAGNOSIS — O9081 Anemia of the puerperium: Secondary | ICD-10-CM | POA: Diagnosis not present

## 2016-03-31 DIAGNOSIS — G40909 Epilepsy, unspecified, not intractable, without status epilepticus: Secondary | ICD-10-CM | POA: Diagnosis present

## 2016-03-31 LAB — TYPE AND SCREEN
ABO/RH(D): B POS
Antibody Screen: NEGATIVE

## 2016-03-31 LAB — CBC
HCT: 31.2 % — ABNORMAL LOW (ref 36.0–46.0)
HEMOGLOBIN: 10.5 g/dL — AB (ref 12.0–15.0)
MCH: 28.5 pg (ref 26.0–34.0)
MCHC: 33.7 g/dL (ref 30.0–36.0)
MCV: 84.8 fL (ref 78.0–100.0)
Platelets: 226 10*3/uL (ref 150–400)
RBC: 3.68 MIL/uL — ABNORMAL LOW (ref 3.87–5.11)
RDW: 13.3 % (ref 11.5–15.5)
WBC: 8 10*3/uL (ref 4.0–10.5)

## 2016-03-31 LAB — RPR: RPR: NONREACTIVE

## 2016-03-31 LAB — ABO/RH: ABO/RH(D): B POS

## 2016-03-31 MED ORDER — FLEET ENEMA 7-19 GM/118ML RE ENEM: 1.0000 | ENEMA | RECTAL | Status: DC | PRN

## 2016-03-31 MED ORDER — EPHEDRINE 5 MG/ML INJ: 10.0000 mg | INTRAVENOUS | Status: DC | PRN

## 2016-03-31 MED ORDER — TERBUTALINE SULFATE 1 MG/ML IJ SOLN: 0.2500 mg | Freq: Once | INTRAMUSCULAR | Status: DC | PRN

## 2016-03-31 MED ORDER — LIDOCAINE HCL (PF) 1 % IJ SOLN
INTRAMUSCULAR | Status: DC | PRN
  Administered 2016-03-31 (×2): 4 mL via EPIDURAL

## 2016-03-31 MED ORDER — ZOLPIDEM TARTRATE 5 MG PO TABS: 5.0000 mg | ORAL_TABLET | Freq: Every evening | ORAL | Status: DC | PRN

## 2016-03-31 MED ORDER — OXYTOCIN 40 UNITS IN LACTATED RINGERS INFUSION - SIMPLE MED
1.0000 m[IU]/min | INTRAVENOUS | Status: DC
Start: 2016-03-31 — End: 2016-04-01

## 2016-03-31 MED ORDER — TOPIRAMATE 100 MG PO TABS
100.0000 mg | ORAL_TABLET | Freq: Two times a day (BID) | ORAL | Status: DC
  Filled 2016-03-31 (×2): qty 1

## 2016-03-31 MED ORDER — PHENYLEPHRINE 40 MCG/ML (10ML) SYRINGE FOR IV PUSH (FOR BLOOD PRESSURE SUPPORT): 80.0000 ug | PREFILLED_SYRINGE | INTRAVENOUS | Status: DC | PRN

## 2016-03-31 MED ORDER — LIDOCAINE HCL (PF) 1 % IJ SOLN
30.0000 mL | INTRAMUSCULAR | Status: DC | PRN
  Filled 2016-03-31: qty 30

## 2016-03-31 MED ORDER — PHENYLEPHRINE 40 MCG/ML (10ML) SYRINGE FOR IV PUSH (FOR BLOOD PRESSURE SUPPORT)
80.0000 ug | PREFILLED_SYRINGE | INTRAVENOUS | Status: DC | PRN
  Filled 2016-03-31: qty 10

## 2016-03-31 MED ORDER — OXYCODONE-ACETAMINOPHEN 5-325 MG PO TABS: 2.0000 | ORAL_TABLET | ORAL | Status: DC | PRN

## 2016-03-31 MED ORDER — OXYTOCIN 40 UNITS IN LACTATED RINGERS INFUSION - SIMPLE MED
1.0000 m[IU]/min | INTRAVENOUS | Status: DC
  Administered 2016-03-31: 2 m[IU]/min via INTRAVENOUS
  Filled 2016-03-31: qty 1000

## 2016-03-31 MED ORDER — ACETAMINOPHEN 325 MG PO TABS: 650.0000 mg | ORAL_TABLET | ORAL | Status: DC | PRN

## 2016-03-31 MED ORDER — OXYTOCIN BOLUS FROM INFUSION
500.0000 mL | Freq: Once | INTRAVENOUS | Status: AC
  Administered 2016-04-01: 500 mL via INTRAVENOUS

## 2016-03-31 MED ORDER — LACTATED RINGERS IV SOLN
500.0000 mL | Freq: Once | INTRAVENOUS | Status: AC
  Administered 2016-03-31: 500 mL via INTRAVENOUS

## 2016-03-31 MED ORDER — SOD CITRATE-CITRIC ACID 500-334 MG/5ML PO SOLN: 30.0000 mL | ORAL | Status: DC | PRN

## 2016-03-31 MED ORDER — LACTATED RINGERS IV SOLN
INTRAVENOUS | Status: DC
  Administered 2016-03-31 (×2): via INTRAVENOUS

## 2016-03-31 MED ORDER — DIPHENHYDRAMINE HCL 50 MG/ML IJ SOLN: 12.5000 mg | INTRAMUSCULAR | Status: DC | PRN

## 2016-03-31 MED ORDER — OXYTOCIN 40 UNITS IN LACTATED RINGERS INFUSION - SIMPLE MED
2.5000 [IU]/h | INTRAVENOUS | Status: DC
  Administered 2016-04-01: 2.5 [IU]/h via INTRAVENOUS

## 2016-03-31 MED ORDER — ONDANSETRON HCL 4 MG/2ML IJ SOLN
4.0000 mg | Freq: Four times a day (QID) | INTRAMUSCULAR | Status: DC | PRN
  Administered 2016-03-31: 4 mg via INTRAVENOUS
  Filled 2016-03-31: qty 2

## 2016-03-31 MED ORDER — LACTATED RINGERS IV SOLN: 500.0000 mL | INTRAVENOUS | Status: DC | PRN

## 2016-03-31 MED ORDER — OXYCODONE-ACETAMINOPHEN 5-325 MG PO TABS: 1.0000 | ORAL_TABLET | ORAL | Status: DC | PRN

## 2016-03-31 MED ORDER — FENTANYL 2.5 MCG/ML BUPIVACAINE 1/10 % EPIDURAL INFUSION (WH - ANES)
14.0000 mL/h | INTRAMUSCULAR | Status: DC | PRN
  Administered 2016-03-31: 12 mL/h via EPIDURAL
  Administered 2016-04-01: 14 mL/h via EPIDURAL
  Filled 2016-03-31 (×2): qty 125

## 2016-03-31 MED ORDER — FENTANYL CITRATE (PF) 100 MCG/2ML IJ SOLN: 100.0000 ug | INTRAMUSCULAR | Status: DC | PRN

## 2016-03-31 NOTE — Anesthesia Preprocedure Evaluation (Addendum)
Anesthesia Evaluation  Patient identified by MRN, date of birth, ID band Patient awake    Reviewed: Allergy & Precautions, Patient's Chart, lab work & pertinent test results  Airway Mallampati: III  TM Distance: >3 FB Neck ROM: Full    Dental no notable dental hx. (+) Teeth Intact   Pulmonary neg pulmonary ROS,    Pulmonary exam normal breath sounds clear to auscultation       Cardiovascular negative cardio ROS Normal cardiovascular exam Rhythm:Regular Rate:Normal     Neuro/Psych Seizures -, Well Controlled,  Last Sz- 2015 negative psych ROS   GI/Hepatic negative GI ROS, Neg liver ROS,   Endo/Other  Obesity  Renal/GU Renal diseaseHx/o renal calculi  negative genitourinary   Musculoskeletal negative musculoskeletal ROS (+)   Abdominal (+) + obese,   Peds  Hematology  (+) anemia ,   Anesthesia Other Findings   Reproductive/Obstetrics (+) Pregnancy                            Anesthesia Physical Anesthesia Plan  ASA: II  Anesthesia Plan: Epidural   Post-op Pain Management:    Induction:   Airway Management Planned: Natural Airway  Additional Equipment:   Intra-op Plan:   Post-operative Plan:   Informed Consent: I have reviewed the patients History and Physical, chart, labs and discussed the procedure including the risks, benefits and alternatives for the proposed anesthesia with the patient or authorized representative who has indicated his/her understanding and acceptance.   Dental advisory given  Plan Discussed with: Anesthesiologist  Anesthesia Plan Comments:         Anesthesia Quick Evaluation

## 2016-03-31 NOTE — Progress Notes (Signed)
  Subjective:  Requesting epidural Contractions every 2 minutes, lasting 60 seconds, intensity 8-9/10    Objective: BP (!) 113/59   Pulse 62   Temp 98.3 F (36.8 C)   Ht 5\' 2"  (1.575 m)   Wt 181 lb (82.1 kg)   LMP 06/04/2015   BMI 33.11 kg/m  No intake/output data recorded. No intake/output data recorded.  FHT:  Category 1 MVU: 160-180 SVE:   Dilation: 3+ Effacement (%): 100 Station: -1 Exam by:: Analycia Khokhar  Labs: Lab Results  Component Value Date   WBC 8.0 03/31/2016   HGB 10.5 (L) 03/31/2016   HCT 31.2 (L) 03/31/2016   MCV 84.8 03/31/2016   PLT 226 03/31/2016    Assessment / Plan: Continue to increase Pitocin for adequate MVU Proceed with epidural Fetal Wellbeing: reassuring Anticipated MOD:  NSVD  Hoke Baer A 03/31/2016, 6:53 PM

## 2016-03-31 NOTE — Anesthesia Pain Management Evaluation Note (Signed)
  [facility] Pain Management Visit Note  Patient: Olivia Kelly, 30 y.o., female  "Hello I am a member of the anesthesia team at [facility]. We have an anesthesia team available at all times to provide care throughout the hospital, including epidural management and anesthesia for C-section. I don't know your plan for the delivery whether it a natural birth, water birth, IV sedation, nitrous supplementation, doula or epidural, but we want to meet your pain goals."   1.Was your pain managed to your expectations on prior hospitalizations?    2.What is your expectation for pain management during this hospitalization?      3.How can we help you reach that goal?   Record the patient's initial score and the patient's pain goal.   Pain:4  Pain Goal: 6 [facility] wants you to be able to say your pain was always managed very well.  Laban Emperor 03/31/2016

## 2016-03-31 NOTE — Progress Notes (Signed)
Olivia Kelly is a 30 y.o. G2P1001 at [redacted]w[redacted]d admitted for induction of labor due to post dates.  Subjective: No complaints.  Comfortable with epidural.    Objective: BP 122/68   Pulse (!) 124   Temp 98.3 F (36.8 C)   Resp 18   Ht 5\' 2"  (1.575 m)   Wt 181 lb (82.1 kg)   LMP 06/04/2015   BMI 33.11 kg/m  No intake/output data recorded. No intake/output data recorded.  FHT:  FHR: 140s bpm, variability: moderate,  accelerations:  Present,  decelerations:  Present occasional variable UC:   regular, every 2-3 minutes SVE:   Dilation: 3 Effacement (%): 90 Station: 0 Exam by:: Dr Su Hilt  Labs: Lab Results  Component Value Date   WBC 8.0 03/31/2016   HGB 10.5 (L) 03/31/2016   HCT 31.2 (L) 03/31/2016   MCV 84.8 03/31/2016   PLT 226 03/31/2016    Assessment / Plan: Induction of labor on pitocin with AROM and IUPC in place  Labor: titrate pitocin per protocol Preeclampsia:  no signs or symptoms of toxicity Fetal Wellbeing:  Category I and category II Pain Control:  Epidural I/D:  GBS neg Anticipated MOD:  NSVD  Alezander Dimaano Y 03/31/2016, 8:49 PM

## 2016-03-31 NOTE — Progress Notes (Signed)
Olivia Kelly is a 30 y.o. G2P1001 at 23w0dadmitted for induction of labor due to Post dates. Due date 03/24/16.  Subjective:  Comfortable with  Labor support without medications Contractions every 2 minutes, lasting 40 seconds, intensity 4-6/10    Objective: BP 105/74   Pulse 84   Temp 98.3 F (36.8 C)   Ht 5\' 2"  (1.575 m)   Wt 181 lb (82.1 kg)   LMP 06/04/2015   BMI 33.11 kg/m  No intake/output data recorded. No intake/output data recorded.  FHT: Category 1 SVE:   Dilation: 3 Effacement (%): 80 Station: -2 Exam by:: Emmilia Sowder  IUPC easily inserted  Labs: Lab Results  Component Value Date   WBC 8.0 03/31/2016   HGB 10.5 (L) 03/31/2016   HCT 31.2 (L) 03/31/2016   MCV 84.8 03/31/2016   PLT 226 03/31/2016    Assessment / Plan: Induction of labor due to postterm, latent phase Continue to increase pitocin to maximize MVU Fetal Wellbeing: reassuring Anticipated MOD:  NSVD  Olivia Kelly A 03/31/2016, 5:12 PM

## 2016-03-31 NOTE — Anesthesia Procedure Notes (Signed)
Epidural Patient location during procedure: OB Start time: 03/31/2016 7:32 PM  Staffing Anesthesiologist: Mal Amabile Performed: anesthesiologist   Preanesthetic Checklist Completed: patient identified, site marked, surgical consent, pre-op evaluation, timeout performed, IV checked, risks and benefits discussed and monitors and equipment checked  Epidural Patient position: sitting Prep: site prepped and draped and DuraPrep Patient monitoring: continuous pulse ox and blood pressure Approach: midline Location: L3-L4 Injection technique: LOR air  Needle:  Needle type: Tuohy  Needle gauge: 17 G Needle length: 9 cm and 9 Needle insertion depth: 4 cm Catheter type: closed end flexible Catheter size: 19 Gauge Catheter at skin depth: 9 cm Test dose: negative and Other  Assessment Events: blood not aspirated, injection not painful, no injection resistance, negative IV test and no paresthesia  Additional Notes Patient identified. Risks and benefits discussed including failed block, incomplete  Pain control, post dural puncture headache, nerve damage, paralysis, blood pressure Changes, nausea, vomiting, reactions to medications-both toxic and allergic and post Partum back pain. All questions were answered. Patient expressed understanding and wished to proceed. Sterile technique was used throughout procedure. Epidural site was Dressed with sterile barrier dressing. No paresthesias, signs of intravascular injection Or signs of intrathecal spread were encountered.  Patient was more comfortable after the epidural was dosed. Please see RN's note for documentation of vital signs and FHR which are stable.

## 2016-03-31 NOTE — Progress Notes (Signed)
Olivia Kelly is a 30 y.o. G2P1001 at [redacted]w[redacted]d  Subjective:  Comfortable with  Labor support without medications Contractions every 2-3 minutes, lasting 40 seconds, intensity 4/10 Foley bulb recently fell out. Pitocin 6 mU/min    Objective: BP 130/63   Pulse 72   Ht 5\' 2"  (1.575 m)   Wt 181 lb (82.1 kg)   LMP 06/04/2015   BMI 33.11 kg/m    FHT:  Category 1 SVE:   Dilation: 3 Effacement (%): 100 Station: -2 Exam by:: Tecla Mailloux  AROM: clear fluid  Labs: Lab Results  Component Value Date   WBC 8.0 03/31/2016   HGB 10.5 (L) 03/31/2016   HCT 31.2 (L) 03/31/2016   MCV 84.8 03/31/2016   PLT 226 03/31/2016    Assessment / Plan: Induction of labor due to gestational hypertension Fetal Wellbeing: reassuring Anticipated MOD:  NSVD  Shamarr Faucett A 03/31/2016, 2:09 PM

## 2016-04-01 ENCOUNTER — Encounter (HOSPITAL_COMMUNITY): Payer: Self-pay

## 2016-04-01 MED ORDER — WITCH HAZEL-GLYCERIN EX PADS: 1.0000 "application " | MEDICATED_PAD | CUTANEOUS | Status: DC | PRN

## 2016-04-01 MED ORDER — TETANUS-DIPHTH-ACELL PERTUSSIS 5-2.5-18.5 LF-MCG/0.5 IM SUSP: 0.5000 mL | Freq: Once | INTRAMUSCULAR | Status: DC

## 2016-04-01 MED ORDER — DIPHENHYDRAMINE HCL 25 MG PO CAPS
25.0000 mg | ORAL_CAPSULE | Freq: Four times a day (QID) | ORAL | Status: DC | PRN
Start: 2016-04-01 — End: 2016-04-03

## 2016-04-01 MED ORDER — COCONUT OIL OIL
1.0000 "application " | TOPICAL_OIL | Status: DC | PRN
  Filled 2016-04-01: qty 120

## 2016-04-01 MED ORDER — SIMETHICONE 80 MG PO CHEW: 80.0000 mg | CHEWABLE_TABLET | ORAL | Status: DC | PRN

## 2016-04-01 MED ORDER — ACETAMINOPHEN 325 MG PO TABS
650.0000 mg | ORAL_TABLET | ORAL | Status: DC | PRN
  Administered 2016-04-01 – 2016-04-02 (×2): 650 mg via ORAL
  Filled 2016-04-01 (×2): qty 2

## 2016-04-01 MED ORDER — SENNOSIDES-DOCUSATE SODIUM 8.6-50 MG PO TABS
2.0000 | ORAL_TABLET | ORAL | Status: DC
  Administered 2016-04-02 (×2): 2 via ORAL
  Filled 2016-04-01 (×2): qty 2

## 2016-04-01 MED ORDER — IBUPROFEN 600 MG PO TABS
600.0000 mg | ORAL_TABLET | Freq: Four times a day (QID) | ORAL | Status: DC
  Administered 2016-04-01 – 2016-04-03 (×10): 600 mg via ORAL
  Filled 2016-04-01 (×10): qty 1

## 2016-04-01 MED ORDER — TOPIRAMATE 100 MG PO TABS
100.0000 mg | ORAL_TABLET | Freq: Two times a day (BID) | ORAL | Status: DC
  Administered 2016-04-01 – 2016-04-03 (×5): 100 mg via ORAL
  Filled 2016-04-01 (×8): qty 1

## 2016-04-01 MED ORDER — OXYCODONE HCL 5 MG PO TABS
10.0000 mg | ORAL_TABLET | ORAL | Status: DC | PRN
  Administered 2016-04-01 – 2016-04-02 (×2): 10 mg via ORAL
  Filled 2016-04-01 (×2): qty 2

## 2016-04-01 MED ORDER — ONDANSETRON HCL 4 MG PO TABS: 4.0000 mg | ORAL_TABLET | ORAL | Status: DC | PRN

## 2016-04-01 MED ORDER — ZOLPIDEM TARTRATE 5 MG PO TABS: 5.0000 mg | ORAL_TABLET | Freq: Every evening | ORAL | Status: DC | PRN

## 2016-04-01 MED ORDER — OXYCODONE HCL 5 MG PO TABS
5.0000 mg | ORAL_TABLET | ORAL | Status: DC | PRN
  Administered 2016-04-01: 5 mg via ORAL
  Filled 2016-04-01: qty 1

## 2016-04-01 MED ORDER — PRENATAL MULTIVITAMIN CH
1.0000 | ORAL_TABLET | Freq: Every day | ORAL | Status: DC
  Administered 2016-04-01 – 2016-04-03 (×3): 1 via ORAL
  Filled 2016-04-01 (×3): qty 1

## 2016-04-01 MED ORDER — DIBUCAINE 1 % RE OINT
1.0000 "application " | TOPICAL_OINTMENT | RECTAL | Status: DC | PRN
  Filled 2016-04-01: qty 28.4

## 2016-04-01 MED ORDER — BENZOCAINE-MENTHOL 20-0.5 % EX AERO
1.0000 "application " | INHALATION_SPRAY | CUTANEOUS | Status: DC | PRN
  Filled 2016-04-01: qty 56

## 2016-04-01 MED ORDER — ONDANSETRON HCL 4 MG/2ML IJ SOLN: 4.0000 mg | INTRAMUSCULAR | Status: DC | PRN

## 2016-04-01 NOTE — Progress Notes (Signed)
Olivia Kelly is a 30 y.o. G2P1001 at [redacted]w[redacted]d admitted for induction of labor due to post dates.  Subjective: No complaints.  Comfortable with epidural.  Objective: BP 108/60   Pulse (!) 49   Temp 97.8 F (36.6 C) (Axillary)   Resp 18   Ht 5\' 2"  (1.575 m)   Wt 181 lb (82.1 kg)   LMP 06/04/2015   SpO2 100%   BMI 33.11 kg/m  No intake/output data recorded. No intake/output data recorded.  FHT:  FHR: 130s-140s bpm, variability: moderate,  accelerations:  Present,  decelerations:  Absent UC:   regular, every 2 minutes SVE:   Dilation: 10 Effacement (%): 100 Station: 0, +1 Exam by:: Dr. Su Hilt  Labs: Lab Results  Component Value Date   WBC 8.0 03/31/2016   HGB 10.5 (L) 03/31/2016   HCT 31.2 (L) 03/31/2016   MCV 84.8 03/31/2016   PLT 226 03/31/2016    Assessment / Plan: Spontaneous labor, progressing normally.  Pitocin held at about 12:45a due to tachysystole.  Labor: Progressing normally.  Will cont to hold pitocin for now and recheck station in about an hour. Preeclampsia:  no signs or symptoms of toxicity Fetal Wellbeing:  Category I Pain Control:  Epidural I/D:  GBS neg Anticipated MOD:  NSVD  Olivia Kelly Y 04/01/2016, 1:38 AM

## 2016-04-01 NOTE — Anesthesia Postprocedure Evaluation (Signed)
Anesthesia Post Note  Patient: Olivia Kelly  Procedure(s) Performed: * No procedures listed *  Patient location during evaluation: Mother Baby Anesthesia Type: Epidural Level of consciousness: awake and alert Pain management: pain level controlled Vital Signs Assessment: post-procedure vital signs reviewed and stable Respiratory status: spontaneous breathing, nonlabored ventilation and respiratory function stable Cardiovascular status: stable Postop Assessment: no headache, no backache and epidural receding Anesthetic complications: no     Last Vitals:  Vitals:   04/01/16 0805 04/01/16 1210  BP: (!) 107/53 (!) 103/59  Pulse: 77 72  Resp: 20 20  Temp: 36.7 C 36.7 C    Last Pain:  Vitals:   04/01/16 1210  TempSrc: Oral  PainSc:    Pain Goal:                 Junious Silk

## 2016-04-01 NOTE — Progress Notes (Signed)
Assumed care of mom and baby.  Visitors at bedside. 

## 2016-04-01 NOTE — Lactation Note (Signed)
This note was copied from a baby's chart. Lactation Consultation Note; Initial visit with mom- baby now 9 hours. Baby has not fed in 5 hours. Suggested offering breast and mom agreeable. Baby latched well in football hold. Mom reports no pain with latch. Nursed for 10 min then off to sleep. Mom complaining of cramping. BF brochure given. Reviewed OP appointments and [facility] as resources for support after DC. To call for assist prn  Patient Name: Olivia Kelly [id number] Date: 04/01/2016 Reason for consult: Initial assessment   Maternal Data Formula Feeding for Exclusion: No Does the patient have breastfeeding experience prior to this delivery?: Yes  Feeding Feeding Type: Breast Fed Length of feed: 10 min  LATCH Score/Interventions Latch: Grasps breast easily, tongue down, lips flanged, rhythmical sucking.  Audible Swallowing: A few with stimulation  Type of Nipple: Everted at rest and after stimulation  Comfort (Breast/Nipple): Soft / non-tender     Hold (Positioning): Assistance needed to correctly position infant at breast and maintain latch. Intervention(s): Breastfeeding basics reviewed;Support Pillows  LATCH Score: 8  Lactation Tools Discussed/Used     Consult Status Consult Status: Follow-up Date: 04/02/16 Follow-up type: In-patient    Pamelia Hoit 04/01/2016, 2:03 PM

## 2016-04-02 LAB — CBC
HEMATOCRIT: 26.1 % — AB (ref 36.0–46.0)
Hemoglobin: 8.9 g/dL — ABNORMAL LOW (ref 12.0–15.0)
MCH: 28.9 pg (ref 26.0–34.0)
MCHC: 34.1 g/dL (ref 30.0–36.0)
MCV: 84.7 fL (ref 78.0–100.0)
PLATELETS: 188 10*3/uL (ref 150–400)
RBC: 3.08 MIL/uL — ABNORMAL LOW (ref 3.87–5.11)
RDW: 13.7 % (ref 11.5–15.5)
WBC: 15.6 10*3/uL — AB (ref 4.0–10.5)

## 2016-04-02 NOTE — Lactation Note (Signed)
This note was copied from a baby's chart. Lactation Consultation Note  Patient Name: Olivia Kelly [id number]'M Date: 04/02/2016 Reason for consult: Follow-up assessment Baby at 36 hr of life. Mom reports bf is going well. She denies breast or nipple pain, voiced no concerns. Discussed baby behavior, feeding frequency, baby belly size, voids, wt loss, breast changes, and nipple care. She is aware of lactation services and support group. She will call as needed.    Maternal Data    Feeding Feeding Type: Breast Fed Length of feed: 15 min  LATCH Score/Interventions                      Lactation Tools Discussed/Used     Consult Status Consult Status: PRN    Rulon Eisenmenger 04/02/2016, 4:34 PM

## 2016-04-02 NOTE — Progress Notes (Signed)
Heard irregular heart rhythm on assessment. Asked pt to have MD listen when they round in the am to determine if pt  Needs to F/U.

## 2016-04-02 NOTE — Discharge Summary (Signed)
[facility] Discharge Summary   Patient Name:   Olivia Kelly DOB:     [birth year] MRN:     [id number]  Date of Admission:   03/31/2016 Date of Discharge:  04/03/2016  Admitting diagnosis:    INDUCTION Principal Problem:   Vaginal delivery Active Problems:   Normal labor   Seizures (HCC)   Anemia due to blood loss, acute   Discharge diagnosis:    INDUCTION Principal Problem:   Vaginal delivery Active Problems:   Normal labor   Seizures (HCC)   Anemia due to blood loss, acute                                                                       Post partum procedures: None  Type of Delivery:  VE assisted vaginal delivery due to maternal exhaustion, recurrent decels and fetal tachycardia  Delivering Provider: Osborn Coho   Date of Delivery:  04/01/16  Newborn Data:    Live born female  Birth Weight: 7 lb 6.2 oz (3351 g) APGAR: 7, 8    Baby Feeding:   Breast Disposition:   home with mother  Complications:   None  Hospital course:      Induction of Labor With Vaginal Delivery   30 y.o. yo [id number] at [redacted]w[redacted]d was admitted to the hospital 03/31/2016 for induction of labor.  Indication for induction: Postdates.  Patient had an uncomplicated labor course as follows: Membrane Rupture Time/Date: 1:50 PM ,03/31/2016   Intrapartum Procedures: Episiotomy: None [1]                                         Lacerations:  Periurethral [8];Labial [10]  Patient had delivery of a Viable infant.  Information for the patient's newborn:  Olivia, Kelly [[id number]]  Delivery Method: Vag-Spont   04/01/2016  Details of delivery can be found in separate delivery note.  Patient had a routine postpartum course. Patient is discharged home 04/03/16.   Physical Exam:   Vitals:   04/02/16 0600 04/02/16 0930 04/02/16 1900 04/02/16 2128  BP: (!) 102/46 103/66 (!) 98/39 110/61  Pulse: 64  86   Resp: 18  19   Temp: 98.5 F (36.9 C)  98.4 F (36.9 C)    TempSrc: Oral  Oral   SpO2:   100%   Weight:      Height:       General: alert Lochia: appropriate Uterine Fundus: firm Incision: Healing well with no significant drainage DVT Evaluation: No evidence of DVT seen on physical exam. Negative Homan's sign.  Labs: CBC Latest Ref Rng & Units 04/02/2016 03/31/2016 07/12/2015  WBC 4.0 - 10.5 K/uL 15.6(H) 8.0 -  Hemoglobin 12.0 - 15.0 g/dL 1.3(Y) 10.5(L) 13.6  Hematocrit 36.0 - 46.0 % 26.1(L) 31.2(L) 40.0  Platelets 150 - 400 K/uL 188 226 -   CMP Latest Ref Rng & Units 07/12/2015  Glucose 65 - 99 mg/dL 86(V)  BUN 6 - 20 mg/dL 10  Creatinine 7.84 - 6.96 mg/dL 2.95(M)  Sodium 841 - 324 mmol/L 142  Potassium 3.5 - 5.1 mmol/L 3.2(L)  Chloride 101 - 111 mmol/L 106  CO2 22 - 32 mmol/L -  Calcium 8.9 - 10.3 mg/dL -  Total Protein 6.0 - 8.3 g/dL -  Total Bilirubin 0.3 - 1.2 mg/dL -  Alkaline Phos 39 - 947 U/L -  AST 0 - 37 U/L -  ALT 0 - 35 U/L -    Discharge instruction: per After Visit Summary and "Baby and Me Booklet".  After Visit Meds:    Medication List    TAKE these medications   ferrous sulfate 325 (65 FE) MG tablet Take 1 tablet (325 mg total) by mouth daily with breakfast.   ibuprofen 600 MG tablet Commonly known as:  ADVIL,MOTRIN Take 1 tablet (600 mg total) by mouth every 6 (six) hours as needed.   multivitamin-prenatal 27-0.8 MG Tabs tablet Take 1 tablet by mouth daily at 12 noon.   topiramate 100 MG tablet Commonly known as:  TOPAMAX Take 1 tablet (100 mg total) by mouth 2 (two) times daily.       Diet: routine diet  Activity: Advance as tolerated. Pelvic rest for 6 weeks.   Outpatient follow up:6 weeks Follow up Appt:No future appointments. Follow up visit: No Follow-up on file.  Postpartum contraception: Undecided  04/03/2016 [name], CNM

## 2016-04-02 NOTE — Progress Notes (Signed)
Post Partum Day 1 Subjective: no complaints, up ad lib and tolerating PO  Objective: Blood pressure (!) 102/46, pulse 64, temperature 98.5 F (36.9 C), temperature source Oral, resp. rate 18, height 5\' 2"  (1.575 m), weight 181 lb (82.1 kg), last menstrual period 06/04/2015, SpO2 100 %, unknown if currently breastfeeding.  Physical Exam:  General: alert and cooperative Lochia: appropriate Uterine Fundus: firm Incision: na DVT Evaluation: Negative Homan's sign.   Recent Labs  03/31/16 0820 04/02/16 0538  HGB 10.5* 8.9*  HCT 31.2* 26.1*    Assessment/Plan: Plan for discharge tomorrow and Breastfeeding   LOS: 2 days   Lauran Romanski A 04/02/2016, 1:53 PM

## 2016-04-03 DIAGNOSIS — D62 Acute posthemorrhagic anemia: Secondary | ICD-10-CM

## 2016-04-03 HISTORY — DX: Acute posthemorrhagic anemia: D62

## 2016-04-03 MED ORDER — FERROUS SULFATE 325 (65 FE) MG PO TABS: 325.0000 mg | ORAL_TABLET | Freq: Every day | ORAL | 3 refills | Status: DC

## 2016-04-03 MED ORDER — IBUPROFEN 600 MG PO TABS: 600.0000 mg | ORAL_TABLET | Freq: Four times a day (QID) | ORAL | 2 refills | Status: DC | PRN

## 2016-04-03 NOTE — Discharge Instructions (Signed)
Postpartum Care After Vaginal Delivery °After you deliver your newborn (postpartum period), the usual stay in the hospital is 24-72 hours. If there were problems with your labor or delivery, or if you have other medical problems, you might be in the hospital longer.  °While you are in the hospital, you will receive help and instructions on how to care for yourself and your newborn during the postpartum period.  °While you are in the hospital: °· Be sure to tell your nurses if you have pain or discomfort, as well as where you feel the pain and what makes the pain worse. °· If you had an incision made near your vagina (episiotomy) or if you had some tearing during delivery, the nurses may put ice packs on your episiotomy or tear. The ice packs may help to reduce the pain and swelling. °· If you are breastfeeding, you may feel uncomfortable contractions of your uterus for a couple of weeks. This is normal. The contractions help your uterus get back to normal size. °· It is normal to have some bleeding after delivery. °· For the first 1-3 days after delivery, the flow is red and the amount may be similar to a period. °· It is common for the flow to start and stop. °· In the first few days, you may pass some small clots. Let your nurses know if you begin to pass large clots or your flow increases. °· Do not  flush blood clots down the toilet before having the nurse look at them. °· During the next 3-10 days after delivery, your flow should become more watery and pink or brown-tinged in color. °· Ten to fourteen days after delivery, your flow should be a small amount of yellowish-white discharge. °· The amount of your flow will decrease over the first few weeks after delivery. Your flow may stop in 6-8 weeks. Most women have had their flow stop by 12 weeks after delivery. °· You should change your sanitary pads frequently. °· Wash your hands thoroughly with soap and water for at least 20 seconds after changing pads, using  the toilet, or before holding or feeding your newborn. °· You should feel like you need to empty your bladder within the first 6-8 hours after delivery. °· In case you become weak, lightheaded, or faint, call your nurse before you get out of bed for the first time and before you take a shower for the first time. °· Within the first few days after delivery, your breasts may begin to feel tender and full. This is called engorgement. Breast tenderness usually goes away within 48-72 hours after engorgement occurs. You may also notice milk leaking from your breasts. If you are not breastfeeding, do not stimulate your breasts. Breast stimulation can make your breasts produce more milk. °· Spending as much time as possible with your newborn is very important. During this time, you and your newborn can feel close and get to know each other. Having your newborn stay in your room (rooming in) will help to strengthen the bond with your newborn.  It will give you time to get to know your newborn and become comfortable caring for your newborn. °· Your hormones change after delivery. Sometimes the hormone changes can temporarily cause you to feel sad or tearful. These feelings should not last more than a few days. If these feelings last longer than that, you should talk to your caregiver. °· If desired, talk to your caregiver about methods of family planning or contraception. °·   Talk to your caregiver about immunizations. Your caregiver may want you to have the following immunizations before leaving the hospital: °· Tetanus, diphtheria, and pertussis (Tdap) or tetanus and diphtheria (Td) immunization. It is very important that you and your family (including grandparents) or others caring for your newborn are up-to-date with the Tdap or Td immunizations. The Tdap or Td immunization can help protect your newborn from getting ill. °· Rubella immunization. °· Varicella (chickenpox) immunization. °· Influenza immunization. You should  receive this annual immunization if you did not receive the immunization during your pregnancy. °  °This information is not intended to replace advice given to you by your health care provider. Make sure you discuss any questions you have with your health care provider. °  °Document Released: 06/19/2007 Document Revised: 05/16/2012 Document Reviewed: 04/18/2012 °Elsevier Interactive Patient Education ©2016 [employer] ° °Iron Deficiency Anemia, Adult °Anemia is a condition in which there are less red blood cells or hemoglobin in the blood than normal. Hemoglobin is the part of red blood cells that carries oxygen. Iron deficiency anemia is anemia caused by too little iron. It is the most common type of anemia. It may leave you tired and short of breath. °CAUSES  °· Lack of iron in the diet. °· Poor absorption of iron, as seen with intestinal disorders. °· Intestinal bleeding. °· Heavy periods. °SIGNS AND SYMPTOMS  °Mild anemia may not be noticeable. Symptoms may include: °· Fatigue. °· Headache. °· Pale skin. °· Weakness. °· Tiredness. °· Shortness of breath. °· Dizziness. °· Cold hands and feet. °· Fast or irregular heartbeat. °DIAGNOSIS  °Diagnosis requires a thorough evaluation and physical exam by your health care provider. Blood tests are generally used to confirm iron deficiency anemia. Additional tests may be done to find the underlying cause of your anemia. These may include: °· Testing for blood in the stool (fecal occult blood test). °· A procedure to see inside the colon and rectum (colonoscopy). °· A procedure to see inside the esophagus and stomach (endoscopy). °TREATMENT  °Iron deficiency anemia is treated by correcting the cause of the deficiency. Treatment may involve: °· Adding iron-rich foods to your diet. °· Taking iron supplements. Pregnant or breastfeeding women need to take extra iron because their normal diet usually does not provide the required amount. °· Taking vitamins. Vitamin C improves  the absorption of iron. Your health care provider may recommend that you take your iron tablets with a glass of orange juice or vitamin C supplement. °· Medicines to make heavy menstrual flow lighter. °· Surgery. °HOME CARE INSTRUCTIONS  °· Take iron as directed by your health care provider. °¨ If you cannot tolerate taking iron supplements by mouth, talk to your health care provider about taking them through a vein (intravenously) or an injection into a muscle. °¨ For the best iron absorption, iron supplements should be taken on an empty stomach. If you cannot tolerate them on an empty stomach, you may need to take them with food. °¨ Do not drink milk or take antacids at the same time as your iron supplements. Milk and antacids may interfere with the absorption of iron. °¨ Iron supplements can cause constipation. Make sure to include fiber in your diet to prevent constipation. A stool softener may also be recommended. °· Take vitamins as directed by your health care provider. °· Eat a diet rich in iron. Foods high in iron include liver, lean beef, whole-grain bread, eggs, dried fruit, and dark green leafy vegetables. °SEEK IMMEDIATE   MEDICAL CARE IF:  °· You faint. If this happens, do not drive. Call your local emergency services (911 in [place] if no other help is available. °· You have chest pain. °· You feel nauseous or vomit. °· You have severe or increased shortness of breath with activity. °· You feel weak. °· You have a rapid heartbeat. °· You have unexplained sweating. °· You become light-headed when getting up from a chair or bed. °MAKE SURE YOU:  °· Understand these instructions. °· Will watch your condition. °· Will get help right away if you are not doing well or get worse. °  °This information is not intended to replace advice given to you by your health care provider. Make sure you discuss any questions you have with your health care provider. °  °Document Released: 08/19/2000 Document Revised:  09/12/2014 Document Reviewed: 04/29/2013 °Elsevier Interactive Patient Education ©2016 [employer] ° °

## 2016-04-03 NOTE — Lactation Note (Signed)
This note was copied from a baby's chart. Lactation Consultation Note  Mom reports that BF is going well. She stopped BF her older child at 1-2 months because her milk "dried up". She was satisfied with this outcome.  Encouraged her to continue BF on cue and to use breast compressions to soften the breast. Also encouraged pumping if the baby did not soften the breast to protect her MS. Engorgement review and prevention reviewed. Reminded of support groups and outpatient services.  Patient Name: Olivia Kelly QASTM'H Date: 04/03/2016     Maternal Data    Feeding Feeding Type: Breast Fed Length of feed: 15 min  LATCH Score/Interventions                      Lactation Tools Discussed/Used     Consult Status Consult Status: PRN    Soyla Dryer 04/03/2016, 9:30 AM

## 2016-05-10 MED FILL — TOPIRAMATE 100 MG TABLET: 100 | 30 days supply | Qty: 60 | Fill #1 | Status: TO

## 2016-06-26 MED FILL — TOPIRAMATE 100 MG TABLET: 100 | 30 days supply | Qty: 60 | Fill #2 | Status: TO

## 2016-08-20 ENCOUNTER — Encounter (HOSPITAL_COMMUNITY): Payer: Self-pay | Admitting: Nurse Practitioner

## 2016-08-20 DIAGNOSIS — G4489 Other headache syndrome: Secondary | ICD-10-CM | POA: Insufficient documentation

## 2016-08-20 DIAGNOSIS — G40909 Epilepsy, unspecified, not intractable, without status epilepticus: Secondary | ICD-10-CM | POA: Insufficient documentation

## 2016-08-20 NOTE — ED Triage Notes (Signed)
Pt states about 2 hours ago she had a witnessed seizure lasting about 3 minutes. She is c/o a headache.

## 2016-08-21 ENCOUNTER — Emergency Department (HOSPITAL_COMMUNITY)
Admission: EM | Admit: 2016-08-21 | Discharge: 2016-08-21 | Disposition: A | Discharge status: Home / Self Care | Payer: Medicaid Other | Attending: Emergency Medicine | Admitting: Emergency Medicine

## 2016-08-21 DIAGNOSIS — G4489 Other headache syndrome: Secondary | ICD-10-CM

## 2016-08-21 DIAGNOSIS — G40909 Epilepsy, unspecified, not intractable, without status epilepticus: Secondary | ICD-10-CM

## 2016-08-21 MED ORDER — TOPIRAMATE 200 MG PO TABS: 200.0000 mg | ORAL_TABLET | Freq: Two times a day (BID) | ORAL | 0 refills | Status: DC

## 2016-08-21 MED ORDER — TOPIRAMATE 100 MG PO TABS: 100.0000 mg | ORAL_TABLET | Freq: Two times a day (BID) | ORAL | 0 refills | Status: DC

## 2016-08-21 MED ORDER — TOPIRAMATE 100 MG PO TABS
200.0000 mg | ORAL_TABLET | Freq: Once | ORAL | Status: AC
  Administered 2016-08-21: 200 mg via ORAL
  Filled 2016-08-21: qty 2

## 2016-08-21 MED ORDER — HYDROCODONE-ACETAMINOPHEN 5-325 MG PO TABS
1.0000 | ORAL_TABLET | Freq: Once | ORAL | Status: AC
  Administered 2016-08-21: 1 via ORAL
  Filled 2016-08-21: qty 1

## 2016-08-21 NOTE — ED Provider Notes (Signed)
[facility]-EMERGENCY DEPT Provider Note   CSN: [id number] Arrival date & time: 08/20/16  2316  By signing my name below, I, Olivia Kelly, attest that this documentation has been prepared under the direction and in the presence of Olivia Rhineonald Jacquita Mulhearn, MD. Electronically Signed: [name], ED Scribe. 08/21/16. 2:37 AM.  History   Chief Complaint Chief Complaint  Patient presents with  . Seizures  . Headache   The history is provided by the patient. No language interpreter was used.  Seizures   This is a new problem. The current episode started 3 to 5 hours ago. There was 1 seizure. The most recent episode lasted 2 to 5 minutes. Associated symptoms include headaches. Characteristics do not include bowel incontinence, bladder incontinence or bit tongue. The episode was witnessed. The seizures did not continue in the ED. There has been no fever. There were no medications administered prior to arrival.  Headache     HPI Comments: Olivia LibmanMorgan T Kelly is a 30 y.o. female with hx of epilepsy, [facility] Doreatha MartinSam, who presents to the Emergency Department complaining of sudden onset, witnessed seizure that occurred at ~9pm this evening. She notes the episode lasted about 3-5 minutes. She states [place] in [employer] [place] when she started feeling hot and dizzy. Pt states she sat down on the  floor. She does not recall anything after. Per pt's spouse, he notes pt began shaking and was mumbling afterwards. He denies head injury. Pt reports associated HA. She notes taking two Topamax, 100mg , tablets in a day but states her pharmacy changed her medication and has not been able to receive it for about 3-4 days. Pt reports her last episodal seizure was on 11/16. No modifying factors noted. She denies biting of tongue, neck pain, back pain, abdominal pain, numbness and weakness. No additional complaints at this time.  Past Medical History:  Diagnosis Date  . Seizures  (HCC)    last seizure 2015, on meds    Patient Active Problem List   Diagnosis Date Noted  . Anemia due to blood loss, acute 04/03/2016  . Vaginal delivery 04/01/2016  . Normal labor 03/31/2016  . 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 Seizures (HCC) 04/04/2013    Past Surgical History:  Procedure Laterality Date  . cyst removed middle of chest at 294-30 years old    . CYSTOSCOPY WITH RETROGRADE PYELOGRAM, URETEROSCOPY AND STENT PLACEMENT Left 10/29/2014   Procedure: CYSTOSCOPY WITH LEFT RETROGRADE PYELOGRAM,  DIAGNOSTIC LEFT  URETEROSCOPY AND STENT PLACEMENT;  Surgeon: Sebastian Acheheodore Manny, MD;  [facility];  Service: Urology;  Laterality: Left;  . CYSTOSCOPY WITH RETROGRADE PYELOGRAM, URETEROSCOPY AND STENT PLACEMENT Left 01/23/2015   Procedure: CYSTOSCOPY WITH LEFT RETROGRADE PYELOGRAM/URETERAL STENT PLACEMENT;  Surgeon: Sebastian Acheheodore Manny, MD;  Location: [facility];  Service: Urology;  Laterality: Left;  . IUD REMOVAL N/A 06/09/2015   Procedure: INTRAUTERINE DEVICE (IUD) REMOVAL;  Surgeon: Allie BossierMyra C Dove, MD;  Location: [facility];  Service: Gynecology;  Laterality: N/A;  . NEPHRECTOMY     half of left kidney removed  . ROBOT ASSISTED PYELOPLASTY Left 01/23/2015   Procedure: ROBOTIC ASSISTED PYELOPLASTY WITH STENT PLACEMENT;  Surgeon: Sebastian Acheheodore Manny, MD;  Location: [facility];  Service: Urology;  Laterality: Left;    OB History    Gravida Para Term Preterm AB Living   2 2 2  0 0 2   SAB TAB Ectopic Multiple Live Births  0 0 0 0 2       Home Medications    Prior to Admission medications   Medication Sig Start Date End Date Taking? Authorizing Provider  ferrous sulfate 325 (65 FE) MG tablet Take 1 tablet (325 mg total) by mouth daily with breakfast. 04/03/16 04/03/17  Nigel BridgemanVicki Latham, CNM  ibuprofen (ADVIL,MOTRIN) 600 MG tablet Take 1 tablet (600 mg total) by mouth every 6 (six) hours as needed. 04/03/16   Nigel BridgemanVicki Latham, CNM    Prenatal Vit-Fe Fumarate-FA (MULTIVITAMIN-PRENATAL) 27-0.8 MG TABS tablet Take 1 tablet by mouth daily at 12 noon.    Historical Provider, MD  topiramate (TOPAMAX) 100 MG tablet Take 1 tablet (100 mg total) by mouth 2 (two) times daily. 09/20/14   Doug SouSam Jacubowitz, MD    Family History Family History  Problem Relation Age of Onset  . Diabetes Mother   . Hypertension Mother   . Diabetes Father   . Cancer Other     Social History Social History  Substance Use Topics  . Smoking status: Never Smoker  . Smokeless tobacco: Never Used  . Alcohol use Yes     Comment: social     Allergies   Patient has no known allergies.   Review of Systems Review of Systems  Gastrointestinal: Negative for abdominal pain and bowel incontinence.  Genitourinary: Negative for bladder incontinence.  Musculoskeletal: Negative for back pain and neck pain.  Neurological: Positive for seizures and headaches. Negative for weakness and numbness.  All other systems reviewed and are negative.    Physical Exam Updated Vital Signs BP 108/78 (BP Location: Left Arm)   Pulse 78   Temp 98.2 F (36.8 C) (Oral)   Resp 16   LMP 08/08/2016 (Approximate)   SpO2 100%   Physical Exam  CONSTITUTIONAL: Well developed/well nourished HEAD: Normocephalic/atraumatic EYES: EOMI/PERRL ENMT: Mucous membranes moist. No tongue lacerations.  NECK: supple no meningeal signs SPINE/BACK:entire spine nontender CV: S1/S2 noted, no murmurs/rubs/gallops noted LUNGS: Lungs are clear to auscultation bilaterally, no apparent distress ABDOMEN: soft, nontender, no rebound or guarding, bowel sounds noted throughout abdomen GU:no cva tenderness NEURO: Pt is awake/alert/appropriate, moves all extremitiesx4.  No facial droop. No ataxia. No past poitning.  EXTREMITIES: pulses normal/equal, full ROM SKIN: warm, color normal PSYCH: no abnormalities of mood noted, alert and oriented to situation  ED Treatments / Results   DIAGNOSTIC  STUDIES: Oxygen Saturation is 100% on RA, normal by my interpretation.    COORDINATION OF CARE: 2:22 AM Discussed treatment plan with pt at bedside which includes seizure and pain medication and pt agreed to plan.    Labs (all labs ordered are listed, but only abnormal results are displayed) Labs Reviewed - No data to display  EKG  EKG Interpretation None       Radiology No results found.  Procedures Procedures (including critical care time)  Medications Ordered in ED Medications  topiramate (TOPAMAX) tablet 200 mg (200 mg Oral Given 08/21/16 0300)  HYDROcodone-acetaminophen (NORCO/VICODIN) 5-325 MG per tablet 1 tablet (1 tablet Oral Given 08/21/16 0300)     Initial Impression / Assessment and Plan / ED Course  I have reviewed the triage vital signs and the nursing notes.    Clinical Course     Pt well appearing  She is back to baseline This is likely due to noncompliance due to issues with pharmacy Will restart topamax She reports HA similar to prior HA after seizure She would like to go home Rx for topamax ordered Advised  neuro f/u Discussed seizure precautions  Final Clinical Impressions(s) / ED Diagnoses   Final diagnoses:  Seizure disorder (HCC)  Other headache syndrome    New Prescriptions New Prescriptions   TOPIRAMATE (TOPAMAX) 100 MG TABLET    Take 1 tablet (100 mg total) by mouth 2 (two) times daily.    I personally performed the services described in this documentation, which was scribed in my presence. The recorded information has been reviewed and is accurate.        Olivia Rhine, MD 08/21/16 [phone]

## 2016-08-21 NOTE — Discharge Instructions (Signed)
Please be aware you may have another seizure ° °Do not drive until seen by your physician for your condition ° °Do not climb ladders/roofs/trees as a seizure can occur at that height and cause serious harm ° °Do not bathe/swim alone as a seizure can occur and cause serious harm ° °Please followup with your physician or neurologist for further testing and possible treatment ° ° °

## 2016-11-22 IMAGING — CR DG CHEST 2V
2 series · 2 of 2 positions shown · non-contrast
Comparison: None.

CLINICAL DATA: Central chest pain since yesterday.

EXAM:
CHEST  2 VIEW

[w chest pa]
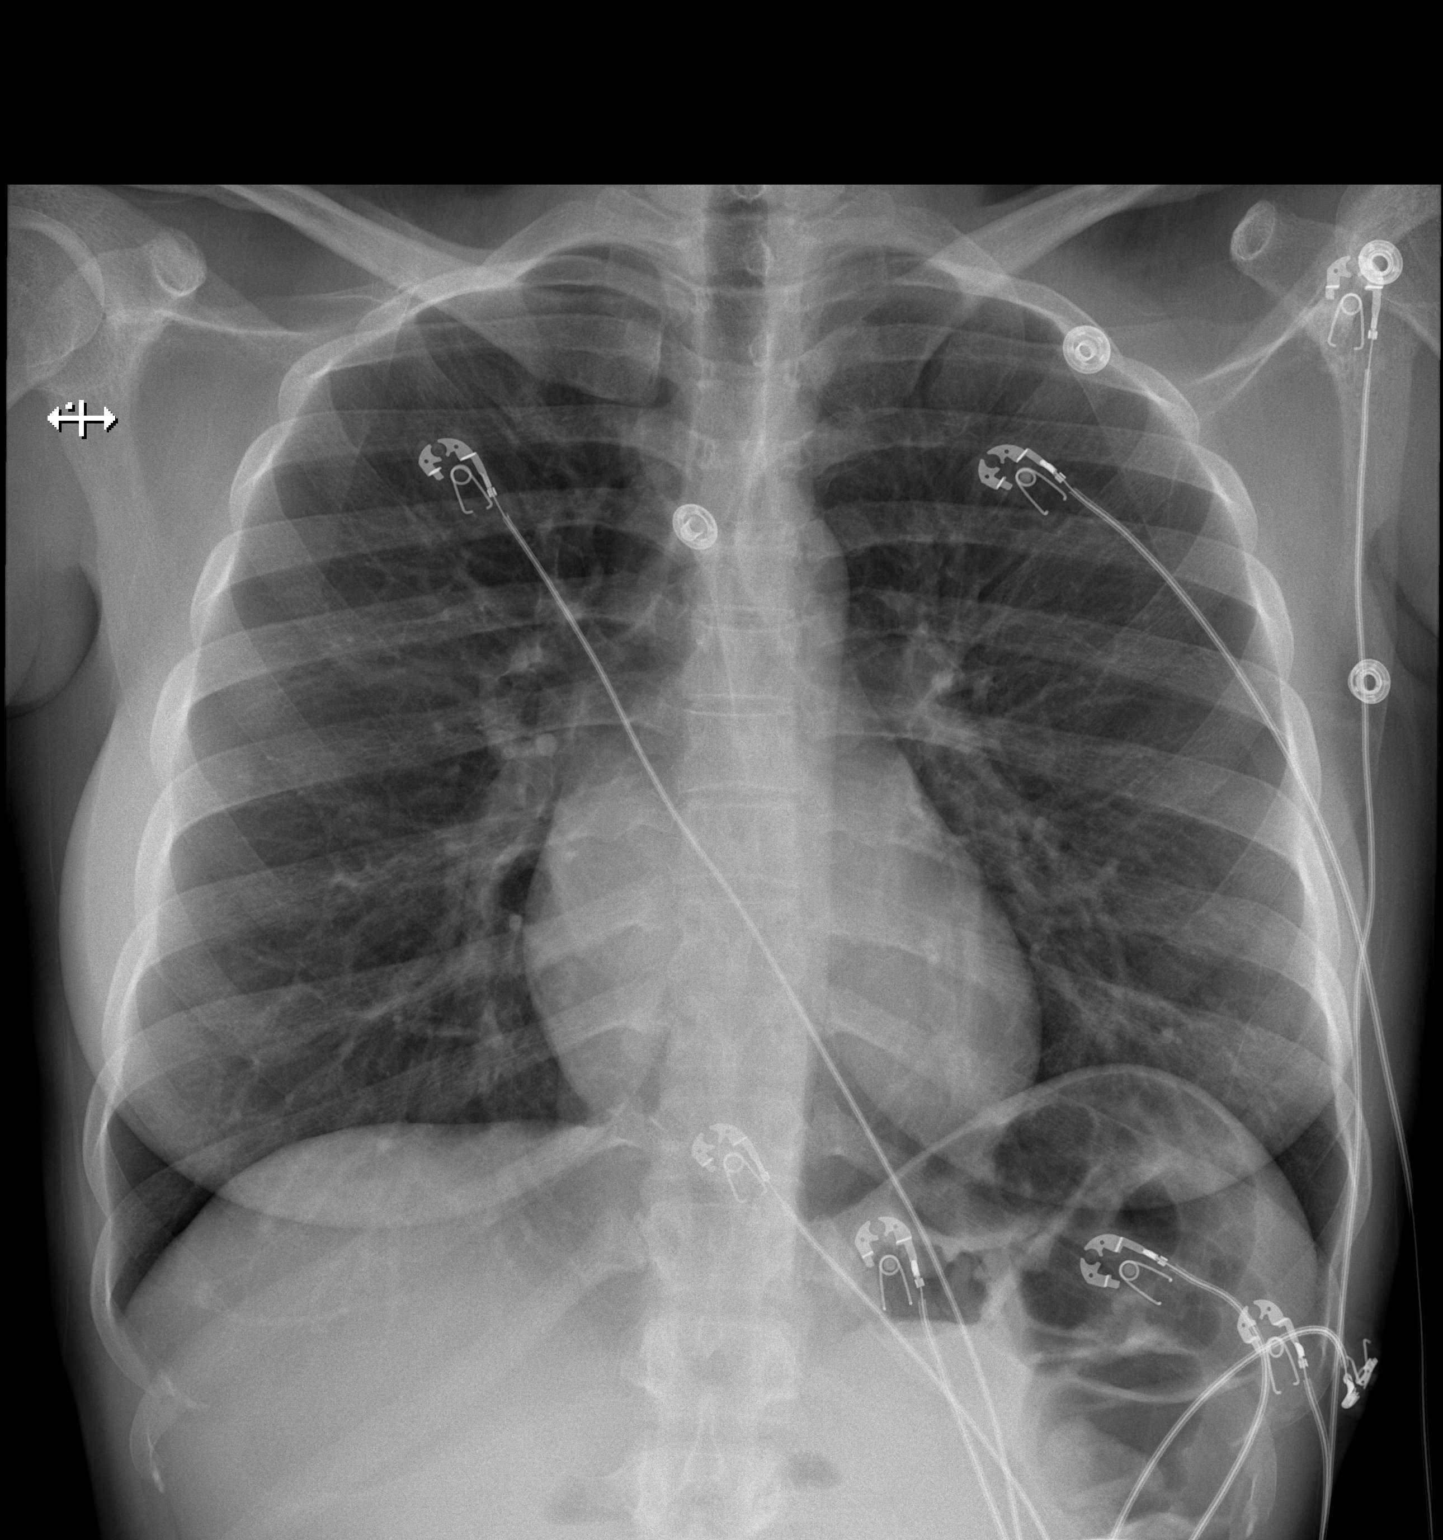

[w chest lat]
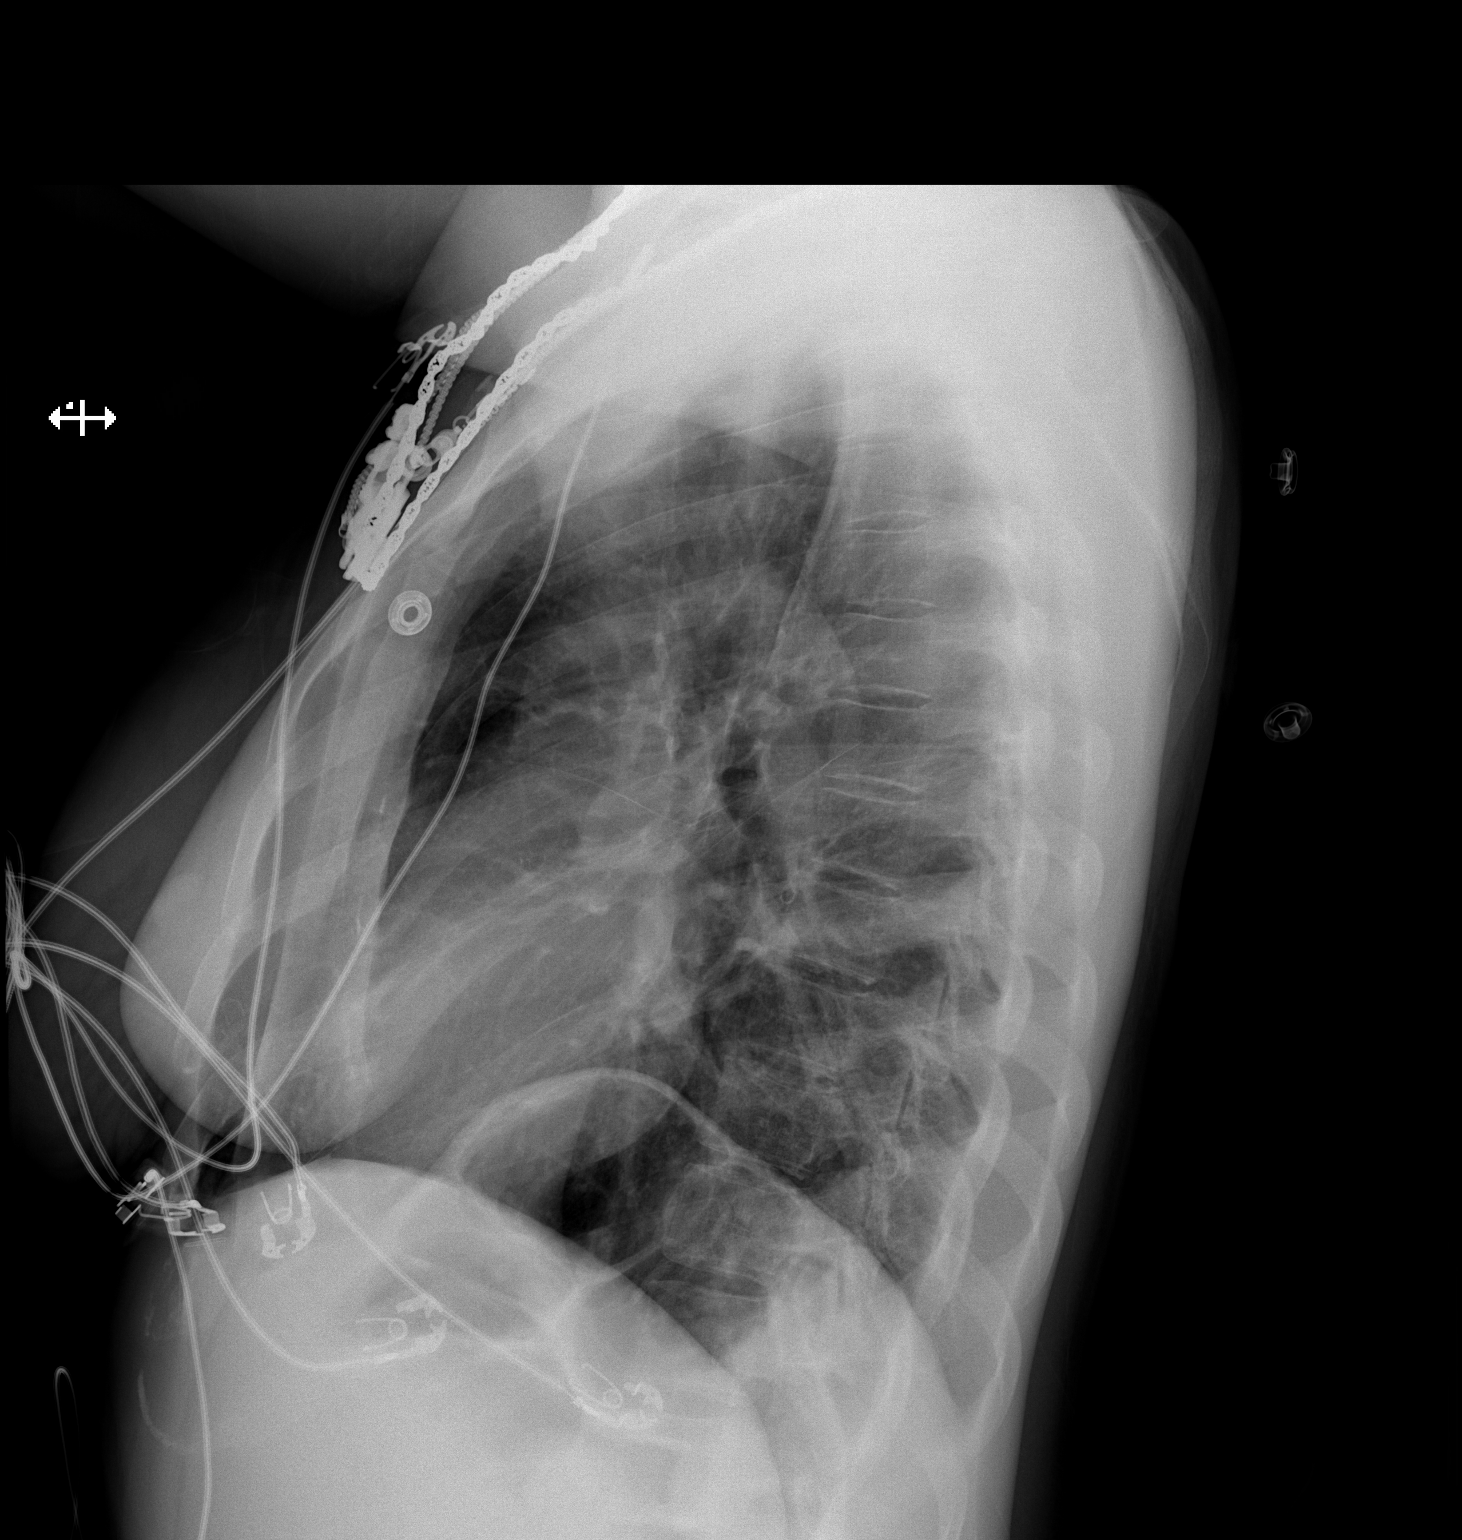

[2 of 2 positions shown; findings below may reference images not displayed]

FINDINGS: The cardiomediastinal contours are normal. The lungs are clear.
Pulmonary vasculature is normal. No consolidation, pleural effusion,
or pneumothorax. No acute osseous abnormalities are seen.
IMPRESSION: No acute pulmonary process.

## 2017-02-16 ENCOUNTER — Ambulatory Visit: Payer: Medicaid Other | Admitting: Obstetrics & Gynecology

## 2017-09-17 ENCOUNTER — Emergency Department (HOSPITAL_COMMUNITY)
Admission: EM | Admit: 2017-09-17 | Discharge: 2017-09-17 | Disposition: A | Discharge status: Home / Self Care | Payer: Self-pay | Attending: Emergency Medicine | Admitting: Emergency Medicine

## 2017-09-17 ENCOUNTER — Encounter (HOSPITAL_COMMUNITY): Payer: Self-pay | Admitting: Nurse Practitioner

## 2017-09-17 ENCOUNTER — Emergency Department (HOSPITAL_COMMUNITY): Payer: Self-pay

## 2017-09-17 DIAGNOSIS — Y999 Unspecified external cause status: Secondary | ICD-10-CM | POA: Insufficient documentation

## 2017-09-17 DIAGNOSIS — Y939 Activity, unspecified: Secondary | ICD-10-CM | POA: Insufficient documentation

## 2017-09-17 DIAGNOSIS — S86911A Strain of unspecified muscle(s) and tendon(s) at lower leg level, right leg, initial encounter: Secondary | ICD-10-CM | POA: Insufficient documentation

## 2017-09-17 DIAGNOSIS — X500XXA Overexertion from strenuous movement or load, initial encounter: Secondary | ICD-10-CM | POA: Insufficient documentation

## 2017-09-17 DIAGNOSIS — Y929 Unspecified place or not applicable: Secondary | ICD-10-CM | POA: Insufficient documentation

## 2017-09-17 NOTE — ED Provider Notes (Signed)
[facility]-EMERGENCY DEPT Provider Note   CSN: [id number] Arrival date & time: 09/17/17  1557     History   Chief Complaint Chief Complaint  Patient presents with  . Knee Pain    Right    HPI Olivia Kelly is a 32 y.o. female.  The history is provided by the patient. No language interpreter was used.  Knee Pain   This is a new problem. The current episode started 2 days ago. The problem occurs constantly. The problem has not changed since onset.The pain is present in the right knee. She has tried nothing for the symptoms. The treatment provided no relief.  Pt reports she twisted her knee 2 days ago.  Pt reports he is swollen and pops,   Past Medical History:  Diagnosis Date  . Seizures (HCC)    last seizure 2015, on meds    Patient Active Problem List   Diagnosis Date Noted  . Anemia due to blood loss, acute 04/03/2016  . Vaginal delivery 04/01/2016  . Normal labor 03/31/2016  . 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 Seizures (HCC) 04/04/2013    Past Surgical History:  Procedure Laterality Date  . cyst removed middle of chest at 27-71 years old    . CYSTOSCOPY WITH RETROGRADE PYELOGRAM, URETEROSCOPY AND STENT PLACEMENT Left 10/29/2014   Procedure: CYSTOSCOPY WITH LEFT RETROGRADE PYELOGRAM,  DIAGNOSTIC LEFT  URETEROSCOPY AND STENT PLACEMENT;  Surgeon: Sebastian Ache, MD;  Location: [facility];  Service: Urology;  Laterality: Left;  . CYSTOSCOPY WITH RETROGRADE PYELOGRAM, URETEROSCOPY AND STENT PLACEMENT Left 01/23/2015   Procedure: CYSTOSCOPY WITH LEFT RETROGRADE PYELOGRAM/URETERAL STENT PLACEMENT;  Surgeon: Sebastian Ache, MD;  Location: [facility];  Service: Urology;  Laterality: Left;  . IUD REMOVAL N/A 06/09/2015   Procedure: INTRAUTERINE DEVICE (IUD) REMOVAL;  Surgeon: Allie Bossier, MD;  Location: [facility];  Service: Gynecology;  Laterality: N/A;  . NEPHRECTOMY     half of left  kidney removed  . ROBOT ASSISTED PYELOPLASTY Left 01/23/2015   Procedure: ROBOTIC ASSISTED PYELOPLASTY WITH STENT PLACEMENT;  Surgeon: Sebastian Ache, MD;  Location: [facility];  Service: Urology;  Laterality: Left;    OB History    Gravida Para Term Preterm AB Living   2 2 2  0 0 2   SAB TAB Ectopic Multiple Live Births   0 0 0 0 2       Home Medications    Prior to Admission medications   Medication Sig Start Date End Date Taking? Authorizing Provider  ferrous sulfate 325 (65 FE) MG tablet Take 1 tablet (325 mg total) by mouth daily with breakfast. Patient not taking: Reported on 08/21/2016 04/03/16 04/03/17  [name], CNM  ibuprofen (ADVIL,MOTRIN) 600 MG tablet Take 1 tablet (600 mg total) by mouth every 6 (six) hours as needed. Patient not taking: Reported on 08/21/2016 04/03/16   [name], CNM  topiramate (TOPAMAX) 100 MG tablet Take 1 tablet (100 mg total) by mouth 2 (two) times daily. 08/21/16   Zadie Rhine, MD    Family History Family History  Problem Relation Age of Onset  . Diabetes Mother   . Hypertension Mother   . Diabetes Father   . Cancer Other     Social History Social History   Tobacco Use  . Smoking status: Never Smoker  . Smokeless tobacco: Never Used  Substance Use Topics  . Alcohol use: Yes    Comment: social  .  Drug use: No     Allergies   Patient has no known allergies.   Review of Systems Review of Systems  All other systems reviewed and are negative.    Physical Exam Updated Vital Signs BP 119/68 (BP Location: Right Arm)   Pulse 85   Temp 98.6 F (37 C)   Resp 18   LMP 08/22/2017   SpO2 100%   Physical Exam  Constitutional: She is oriented to person, place, and time. She appears well-developed and well-nourished.  HENT:  Head: Normocephalic.  Eyes: EOM are normal.  Neck: Normal range of motion.  Pulmonary/Chest: Effort normal.  Abdominal: She exhibits no distension.  Musculoskeletal: Normal range of motion. She  exhibits tenderness.  Tender medial right knee.  From  Ns and nv intact  No instability   Neurological: She is alert and oriented to person, place, and time.  Psychiatric: She has a normal mood and affect.  Nursing note and vitals reviewed.    ED Treatments / Results  Labs (all labs ordered are listed, but only abnormal results are displayed) Labs Reviewed - No data to display  EKG  EKG Interpretation None       Radiology Dg Knee Complete 4 Views Right  Result Date: 09/17/2017 CLINICAL DATA:  Right anterior and medial knee pain since trauma last night. EXAM: RIGHT KNEE - COMPLETE 4+ VIEW COMPARISON:  None. FINDINGS: No evidence of fracture, dislocation, or joint effusion. No evidence of arthropathy or other focal bone abnormality. Soft tissues are unremarkable. IMPRESSION: Negative. Electronically Signed   By: Sherian ReinWei-Chen  Lin M.D.   On: 09/17/2017 16:43    Procedures Procedures (including critical care time)  Medications Ordered in ED Medications - No data to display   Initial Impression / Assessment and Plan / ED Course  I have reviewed the triage vital signs and the nursing notes.  Pertinent labs & imaging results that were available during my care of the patient were reviewed by me and considered in my medical decision making (see chart for details).     Knee sleeve Ibuprofen Schedule to see the Orthopaedist for evaluation   Final Clinical Impressions(s) / ED Diagnoses   Final diagnoses:  Strain of right knee, initial encounter    ED Discharge Orders    None    An After Visit Summary was printed and given to the patient.    Elson AreasSofia, Leslie K, [place]PA-C 09/17/17 1749    Tegeler, Canary Brimhristopher J, MD 09/17/17 2115

## 2017-09-17 NOTE — ED Triage Notes (Signed)
Pt is c/o right knee pain that she is attributing to twisting it a few days ago and has been experiencing a "pop & lock" sensation. She reports ability to bare weight on it and exhibits active ROM.

## 2018-02-24 ENCOUNTER — Emergency Department (HOSPITAL_COMMUNITY)
Admission: EM | Admit: 2018-02-24 | Discharge: 2018-02-25 | Disposition: A | Discharge status: Home / Self Care | Payer: Medicaid Other | Attending: Physician Assistant | Admitting: Physician Assistant

## 2018-02-24 ENCOUNTER — Emergency Department (HOSPITAL_COMMUNITY): Payer: Medicaid Other

## 2018-02-24 ENCOUNTER — Other Ambulatory Visit: Payer: Self-pay

## 2018-02-24 ENCOUNTER — Encounter (HOSPITAL_COMMUNITY): Payer: Self-pay | Admitting: Emergency Medicine

## 2018-02-24 DIAGNOSIS — W458XXA Other foreign body or object entering through skin, initial encounter: Secondary | ICD-10-CM | POA: Insufficient documentation

## 2018-02-24 DIAGNOSIS — Z1881 Retained glass fragments: Secondary | ICD-10-CM | POA: Insufficient documentation

## 2018-02-24 DIAGNOSIS — L923 Foreign body granuloma of the skin and subcutaneous tissue: Secondary | ICD-10-CM | POA: Insufficient documentation

## 2018-02-24 DIAGNOSIS — Z23 Encounter for immunization: Secondary | ICD-10-CM | POA: Insufficient documentation

## 2018-02-24 DIAGNOSIS — Z79899 Other long term (current) drug therapy: Secondary | ICD-10-CM | POA: Insufficient documentation

## 2018-02-24 DIAGNOSIS — T148XXA Other injury of unspecified body region, initial encounter: Secondary | ICD-10-CM

## 2018-02-24 MED ORDER — LIDOCAINE HCL 2 % IJ SOLN
20.0000 mL | Freq: Once | INTRAMUSCULAR | Status: AC
  Administered 2018-02-24: 400 mg
  Filled 2018-02-24: qty 20

## 2018-02-24 NOTE — ED Triage Notes (Signed)
Pt c/o pain and swelling to bottom on R foot, pt states she first felt pain while sweeping kitchen, unkn FB. X 4-5 days ago.

## 2018-02-25 ENCOUNTER — Emergency Department (HOSPITAL_COMMUNITY): Payer: Medicaid Other

## 2018-02-25 MED ORDER — TETANUS-DIPHTH-ACELL PERTUSSIS 5-2.5-18.5 LF-MCG/0.5 IM SUSP
0.5000 mL | Freq: Once | INTRAMUSCULAR | Status: AC
  Administered 2018-02-25: 0.5 mL via INTRAMUSCULAR
  Filled 2018-02-25: qty 0.5

## 2018-02-25 NOTE — ED Provider Notes (Signed)
[facility] EMERGENCY DEPARTMENT Provider Note   CSN: [id number] Arrival date & time: 02/24/18  2038     History   Chief Complaint Chief Complaint  Patient presents with  . Foreign Body in Skin    HPI Olivia Kelly is a 32 y.o. female.  HPI Patient presents to the emergency department with glass in her right foot.  The patient states she was sweeping her kitchen when she felt like she stepped on something sharp.  The patient states she is having pain to the bottom of her right foot.  Patient states this happened 4 to 5 days ago.  Patient states she did not take any medications prior to arrival for symptoms she states she did attempt to remove it by squeezing the area and she states some pus did come out.  Patient denies any fevers nausea vomiting or weakness. Past Medical History:  Diagnosis Date  . Seizures (HCC)    last seizure 2015, on meds    Patient Active Problem List   Diagnosis Date Noted  . Anemia due to blood loss, acute 04/03/2016  . Vaginal delivery 04/01/2016  . Normal labor 03/31/2016  . 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 Seizures (HCC) 04/04/2013    Past Surgical History:  Procedure Laterality Date  . cyst removed middle of chest at 2-25 years old    . CYSTOSCOPY WITH RETROGRADE PYELOGRAM, URETEROSCOPY AND STENT PLACEMENT Left 10/29/2014   Procedure: CYSTOSCOPY WITH LEFT RETROGRADE PYELOGRAM,  DIAGNOSTIC LEFT  URETEROSCOPY AND STENT PLACEMENT;  Surgeon: Sebastian Ache, MD;  Location: [facility];  Service: Urology;  Laterality: Left;  . CYSTOSCOPY WITH RETROGRADE PYELOGRAM, URETEROSCOPY AND STENT PLACEMENT Left 01/23/2015   Procedure: CYSTOSCOPY WITH LEFT RETROGRADE PYELOGRAM/URETERAL STENT PLACEMENT;  Surgeon: Sebastian Ache, MD;  Location: [facility];  Service: Urology;  Laterality: Left;  . IUD REMOVAL N/A 06/09/2015   Procedure: INTRAUTERINE DEVICE (IUD) REMOVAL;   Surgeon: Allie Bossier, MD;  Location: [facility];  Service: Gynecology;  Laterality: N/A;  . NEPHRECTOMY     half of left kidney removed  . ROBOT ASSISTED PYELOPLASTY Left 01/23/2015   Procedure: ROBOTIC ASSISTED PYELOPLASTY WITH STENT PLACEMENT;  Surgeon: Sebastian Ache, MD;  Location: [facility];  Service: Urology;  Laterality: Left;     OB History    Gravida  2   Para  2   Term  2   Preterm  0   AB  0   Living  2     SAB  0   TAB  0   Ectopic  0   Multiple  0   Live Births  2            Home Medications    Prior to Admission medications   Medication Sig Start Date End Date Taking? Authorizing Provider  ferrous sulfate 325 (65 FE) MG tablet Take 1 tablet (325 mg total) by mouth daily with breakfast. Patient not taking: Reported on 08/21/2016 04/03/16 04/03/17  [name], CNM  ibuprofen (ADVIL,MOTRIN) 600 MG tablet Take 1 tablet (600 mg total) by mouth every 6 (six) hours as needed. Patient not taking: Reported on 08/21/2016 04/03/16   [name], CNM  topiramate (TOPAMAX) 100 MG tablet Take 1 tablet (100 mg total) by mouth 2 (two) times daily. 08/21/16   Zadie Rhine, MD    Family History Family History  Problem Relation Age of Onset  . Diabetes Mother   .  Hypertension Mother   . Diabetes Father   . Cancer Other     Social History Social History   Tobacco Use  . Smoking status: Never Smoker  . Smokeless tobacco: Never Used  Substance Use Topics  . Alcohol use: Yes    Comment: social  . Drug use: No     Allergies   Patient has no known allergies.   Review of Systems Review of Systems All other systems negative except as documented in the HPI. All pertinent positives and negatives as reviewed in the HPI.  Physical Exam Updated Vital Signs BP (!) 106/57 (BP Location: Right Arm)   Pulse 64   Temp 98.2 F (36.8 C) (Oral)   Resp 12   Ht 5\' 2"  (1.575 m)   Wt 70.3 kg (155 lb)   LMP 01/24/2018   SpO2 100%   BMI 28.35 kg/m   Physical  Exam  Constitutional: She is oriented to person, place, and time. She appears well-developed and well-nourished. No distress.  HENT:  Head: Normocephalic and atraumatic.  Eyes: Pupils are equal, round, and reactive to light.  Pulmonary/Chest: Effort normal.  Musculoskeletal:       Feet:  Neurological: She is alert and oriented to person, place, and time.  Skin: Skin is warm and dry.  Psychiatric: She has a normal mood and affect.  Nursing note and vitals reviewed.    ED Treatments / Results  Labs (all labs ordered are listed, but only abnormal results are displayed) Labs Reviewed - No data to display  EKG None  Radiology Dg Foot Complete Right  Addendum Date: 02/24/2018   ADDENDUM REPORT: 02/24/2018 21:59 ADDENDUM: Ebbie Ridgehris Meng [place], PA-C, called and questioned a small linear foreign body in the plantar aspect of the distal right foot at the level of the 1st metatarsal sesamoid bone. He reports that this is the site of the patient's wound. On review of the images, there is a small linear foreign body just beneath the skin at that location, compatible with a glass fragment or splinter. Electronically Signed   By: Beckie SaltsSteven  Reid M.D.   On: 02/24/2018 21:59   Result Date: 02/24/2018 CLINICAL DATA:  Pain and swelling in the plantar aspect of the right foot for the past 4-5 days. The patient thinks that she may have stepped on a piece of glass. EXAM: RIGHT FOOT COMPLETE - 3+ VIEW COMPARISON:  Right ankle dated 03/27/2009. FINDINGS: Stable spur arising from the dorsal aspect of the distal talus. No fracture or radiopaque foreign body seen. IMPRESSION: No acute abnormality.  No radiopaque foreign body. Electronically Signed: By: Beckie SaltsSteven  Reid M.D. On: 02/24/2018 21:46    Procedures .Foreign Body Removal Date/Time: 02/25/2018 12:20 AM Performed by: Charlestine NightLawyer, Abbigayle Toole, PA-C Authorized by: Charlestine NightLawyer, Annabell Oconnor, PA-C  Consent: Verbal consent obtained. Risks and benefits: risks, benefits and  alternatives were discussed Consent given by: patient Patient understanding: patient states understanding of the procedure being performed Patient consent: the patient's understanding of the procedure matches consent given Procedure consent: procedure consent matches procedure scheduled Imaging studies: imaging studies available Patient identity confirmed: verbally with patient and arm band Body area: skin General location: lower extremity Location details: right foot Anesthesia: local infiltration  Anesthesia: Local Anesthetic: lidocaine 2% without epinephrine  Sedation: Patient sedated: no  Patient restrained: no Localization method: probed Removal mechanism: alligator forceps, forceps, hemostat and scalpel Dressing: dressing applied Tendon involvement: none Depth: subcutaneous Complexity: complex 1 objects recovered. Post-procedure assessment: foreign body removed Patient tolerance: Patient tolerated the procedure  well with no immediate complications   (including critical care time)  Medications Ordered in ED Medications  Tdap (BOOSTRIX) injection 0.5 mL (has no administration in time range)  lidocaine (XYLOCAINE) 2 % (with pres) injection 400 mg (400 mg Infiltration Given by Other 02/24/18 2211)     Initial Impression / Assessment and Plan / ED Course  I have reviewed the triage vital signs and the nursing notes.  Pertinent labs & imaging results that were available during my care of the patient were reviewed by me and considered in my medical decision making (see chart for details).    Patient is advised to keep the area clean and dry.  I advised her to let the air get to the area when she is not going out in public.  Patient is advised to return for any signs of infection.  Told these soap and water to clean it and soak in Epsom salts.   Final Clinical Impressions(s) / ED Diagnoses   Final diagnoses:  None    ED Discharge Orders    None       Charlestine Night, PA-C 02/25/18 0022    Abelino Derrick, MD 02/26/18 1553

## 2018-02-25 NOTE — Discharge Instructions (Signed)
Return here as needed.  Return for any signs of infection.  Soak the foot in warm water and Epsom salts.  Also clean the wound with soap and water.  Allow the area to get some air when not going out in public.

## 2018-05-23 ENCOUNTER — Ambulatory Visit (INDEPENDENT_AMBULATORY_CARE_PROVIDER_SITE_OTHER): Payer: Medicaid Other | Admitting: Student

## 2018-05-23 VITALS — BP 124/77 | HR 76 | Ht 63.0 in | Wt 161.6 lb

## 2018-05-23 DIAGNOSIS — Z0000 Encounter for general adult medical examination without abnormal findings: Secondary | ICD-10-CM

## 2018-05-23 DIAGNOSIS — Z01419 Encounter for gynecological examination (general) (routine) without abnormal findings: Secondary | ICD-10-CM

## 2018-05-23 NOTE — Progress Notes (Signed)
GYNECOLOGY CLINIC ANNUAL PREVENTATIVE CARE ENCOUNTER NOTE  Subjective:   Olivia Kelly is a 32 y.o. [phone] female here for a routine annual gynecologic exam.  Current complaints: none.   Denies abnormal vaginal bleeding, discharge, pelvic pain, problems with intercourse or other gynecologic concerns.  She is not currently in a relationship & denies concern for STDs.  Currently on menses. Reports regular monthly menses that last ~4 days.  Goes to PCP who manages her seizures. Has appt in a month. Hasn't had a seizure in years.    Gynecologic History LMP 05/22/18 Contraception: none Last Pap: 2017. Results were: normal. Has history of abnormal pap w/LEEP in 2016 Last mammogram: n/a.  Obstetric History OB History  Gravida Para Term Preterm AB Living  2 2 2  0 0 2  SAB TAB Ectopic Multiple Live Births  0 0 0 0 2    # Outcome Date GA Lbr Len/2nd Weight Sex Delivery Anes PTL Lv  2 Term 04/01/16 [redacted]w[redacted]d 11:36 / 03:05 7 lb 6.2 oz (3.351 kg) F Vag-Vacuum EPI  LIV  1 Term 2008 [redacted]w[redacted]d  7 lb 12 oz (3.515 kg) F Vag-Spont   LIV    Past Medical History:  Diagnosis Date  . Seizures (HCC)    last seizure 2015, on meds    Past Surgical History:  Procedure Laterality Date  . cyst removed middle of chest at 75-17 years old    . CYSTOSCOPY WITH RETROGRADE PYELOGRAM, URETEROSCOPY AND STENT PLACEMENT Left 10/29/2014   Procedure: CYSTOSCOPY WITH LEFT RETROGRADE PYELOGRAM,  DIAGNOSTIC LEFT  URETEROSCOPY AND STENT PLACEMENT;  Surgeon: Sebastian Ache, MD;  Location: [facility];  Service: Urology;  Laterality: Left;  . CYSTOSCOPY WITH RETROGRADE PYELOGRAM, URETEROSCOPY AND STENT PLACEMENT Left 01/23/2015   Procedure: CYSTOSCOPY WITH LEFT RETROGRADE PYELOGRAM/URETERAL STENT PLACEMENT;  Surgeon: Sebastian Ache, MD;  Location: [facility];  Service: Urology;  Laterality: Left;  . IUD REMOVAL N/A 06/09/2015   Procedure: INTRAUTERINE DEVICE (IUD) REMOVAL;  Surgeon: Allie Bossier, MD;  Location: [facility];  Service: Gynecology;  Laterality: N/A;  . NEPHRECTOMY     half of left kidney removed  . ROBOT ASSISTED PYELOPLASTY Left 01/23/2015   Procedure: ROBOTIC ASSISTED PYELOPLASTY WITH STENT PLACEMENT;  Surgeon: Sebastian Ache, MD;  Location: [facility];  Service: Urology;  Laterality: Left;    Current Outpatient Medications on File Prior to Visit  Medication Sig Dispense Refill  . topiramate (TOPAMAX) 100 MG tablet Take 1 tablet (100 mg total) by mouth 2 (two) times daily. 28 tablet 0  . ferrous sulfate 325 (65 FE) MG tablet Take 1 tablet (325 mg total) by mouth daily with breakfast. (Patient not taking: Reported on 08/21/2016) 30 tablet 3  . ibuprofen (ADVIL,MOTRIN) 600 MG tablet Take 1 tablet (600 mg total) by mouth every 6 (six) hours as needed. (Patient not taking: Reported on 08/21/2016) 30 tablet 2   No current facility-administered medications on file prior to visit.     No Known Allergies  Social History   Socioeconomic History  . Marital status: Single    Spouse name: Not on file  . Number of children: Not on file  . Years of education: Not on file  . Highest education level: Not on file  Occupational History  . Not on file  Social Needs  . Financial resource strain: Not on file  . Food insecurity:    Worry: Not on file    Inability: Not on file  . Transportation  needs:    Medical: Not on file    Non-medical: Not on file  Tobacco Use  . Smoking status: Never Smoker  . Smokeless tobacco: Never Used  Substance and Sexual Activity  . Alcohol use: Yes    Comment: social  . Drug use: No  . Sexual activity: Not on file    Comment: Mirena IUD in place  Lifestyle  . Physical activity:    Days per week: Not on file    Minutes per session: Not on file  . Stress: Not on file  Relationships  . Social connections:    Talks on phone: Not on file    Gets together: Not on file    Attends religious service: Not on file    Active member of club or organization: Not on file      Attends meetings of clubs or organizations: Not on file    Relationship status: Not on file  . Intimate partner violence:    Fear of current or ex partner: Not on file    Emotionally abused: Not on file    Physically abused: Not on file    Forced sexual activity: Not on file  Other Topics Concern  . Not on file  Social History Narrative  . Not on file    Family History  Problem Relation Age of Onset  . Diabetes Mother   . Hypertension Mother   . Diabetes Father   . Cancer Other     The following portions of the patient's history were reviewed and updated as appropriate: allergies, current medications, past family history, past medical history, past social history, past surgical history and problem list.  Review of Systems Pertinent items are noted in HPI.   Objective:  BP 124/77   Pulse 76   Ht 5\' 3"  (1.6 m)   Wt 161 lb 9.6 oz (73.3 kg)   BMI 28.63 kg/m  CONSTITUTIONAL: Well-developed, well-nourished female in no acute distress.  HENT:  Normocephalic, atraumatic, External right and left ear normal. Oropharynx is clear and moist EYES: Conjunctivae and EOM are normal. Pupils are equal, round, and reactive to light. No scleral icterus.  NECK: Normal range of motion, supple, no masses.  Normal thyroid.  SKIN: Skin is warm and dry. No rash noted. Not diaphoretic. No erythema. No pallor. NEUROLGIC: Alert and oriented to person, place, and time. Normal reflexes, muscle tone coordination. No cranial nerve deficit noted. PSYCHIATRIC: Normal mood and affect. Normal behavior. Normal judgment and thought content. CARDIOVASCULAR: Normal heart rate noted, regular rhythm RESPIRATORY: Clear to auscultation bilaterally. Effort and breath sounds normal, no problems with respiration noted. BREASTS: Symmetric in size. No masses, skin changes, nipple drainage, or lymphadenopathy. ABDOMEN: Soft, normal bowel sounds, no distention noted.  No tenderness, rebound or guarding.  PELVIC:  deferred.   Assessment:  Annual gynecologic examination without pap smear   Plan:  1. Encounter for annual routine gynecological examination -unable to perform pap today d/t menses. Patient will return in 2 weeks for pap only.    Judeth HornLawrence, Melvinia Ashby, NP

## 2018-05-23 NOTE — Patient Instructions (Signed)

## 2018-06-08 ENCOUNTER — Other Ambulatory Visit (HOSPITAL_COMMUNITY)
Admission: RE | Admit: 2018-06-08 | Discharge: 2018-06-08 | Disposition: A | Discharge status: Home / Self Care | Payer: Medicaid Other | Source: Ambulatory Visit | Attending: Obstetrics and Gynecology | Admitting: Obstetrics and Gynecology

## 2018-06-08 ENCOUNTER — Ambulatory Visit: Payer: Medicaid Other | Admitting: Student

## 2018-06-08 ENCOUNTER — Encounter: Payer: Self-pay | Admitting: Student

## 2018-06-08 VITALS — BP 117/70 | HR 76 | Ht 62.5 in | Wt 164.0 lb

## 2018-06-08 DIAGNOSIS — Z01419 Encounter for gynecological examination (general) (routine) without abnormal findings: Secondary | ICD-10-CM | POA: Insufficient documentation

## 2018-06-08 NOTE — Progress Notes (Signed)
History:  [name] is a 32 y.o. [id number] who presents to clinic today for pap smear. Was seen a few weeks ago for annual exam but was on menstrual cycle at the time so returned today for pap smear only.     Patient Active Problem List   Diagnosis Date Noted  . Anemia due to blood loss, acute 04/03/2016  . 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 Seizures (HCC) 04/04/2013    No Known Allergies  Current Outpatient Medications on File Prior to Visit  Medication Sig Dispense Refill  . topiramate (TOPAMAX) 100 MG tablet Take 1 tablet (100 mg total) by mouth 2 (two) times daily. 28 tablet 0  . ferrous sulfate 325 (65 FE) MG tablet Take 1 tablet (325 mg total) by mouth daily with breakfast. (Patient not taking: Reported on 08/21/2016) 30 tablet 3  . ibuprofen (ADVIL,MOTRIN) 600 MG tablet Take 1 tablet (600 mg total) by mouth every 6 (six) hours as needed. (Patient not taking: Reported on 08/21/2016) 30 tablet 2   No current facility-administered medications on file prior to visit.      The following portions of the patient's history were reviewed and updated as appropriate: allergies, current medications, family history, past medical history, social history, past surgical history and problem list.  Review of Systems:  Other than those mentioned in HPI all ROS negative   Objective:  Physical Exam BP 117/70   Pulse 76   Ht 5' 2.5" (1.588 m)   Wt 164 lb (74.4 kg)   BMI 29.52 kg/m  CONSTITUTIONAL: Well-developed, well-nourished female in no acute distress.  GENITOURINARY: Normal appearing external genitalia; normal appearing vaginal mucosa and cervix.  Normal appearing discharge.  Pap smear obtained.  Normal uterine size, no other palpable masses, no uterine or adnexal tenderness.  SKIN: Skin is warm and dry. No rash noted. Not diaphoretic. No erythema. No pallor. NEUROLGIC: Alert and oriented to person, place, and time.  Normal reflexes, muscle tone, coordination. No cranial nerve deficit noted.  Labs and Imaging Pap collected  Assessment & Plan:  1. Encounter for cervical Pap smear with pelvic exam  - Cytology - PAP   Judeth Horn, NP 06/08/2018 1:18 PM

## 2018-06-08 NOTE — Patient Instructions (Signed)

## 2018-06-13 LAB — CYTOLOGY - PAP
CHLAMYDIA, DNA PROBE: NEGATIVE
DIAGNOSIS: NEGATIVE
NEISSERIA GONORRHEA: NEGATIVE

## 2018-06-22 ENCOUNTER — Other Ambulatory Visit: Payer: Self-pay | Admitting: General Practice

## 2018-06-22 DIAGNOSIS — B379 Candidiasis, unspecified: Secondary | ICD-10-CM

## 2018-06-22 MED ORDER — FLUCONAZOLE 150 MG PO TABS
150.0000 mg | ORAL_TABLET | Freq: Once | ORAL | 0 refills | Status: AC
Start: 2018-06-22 — End: 2018-06-22

## 2018-07-27 ENCOUNTER — Ambulatory Visit (HOSPITAL_COMMUNITY)
Admission: EM | Admit: 2018-07-27 | Discharge: 2018-07-27 | Disposition: A | Discharge status: Home / Self Care | Payer: Self-pay | Attending: Family Medicine | Admitting: Family Medicine

## 2018-07-27 ENCOUNTER — Ambulatory Visit (INDEPENDENT_AMBULATORY_CARE_PROVIDER_SITE_OTHER): Payer: Self-pay

## 2018-07-27 ENCOUNTER — Encounter (HOSPITAL_COMMUNITY): Payer: Self-pay | Admitting: Emergency Medicine

## 2018-07-27 DIAGNOSIS — M25572 Pain in left ankle and joints of left foot: Secondary | ICD-10-CM

## 2018-07-27 NOTE — ED Triage Notes (Signed)
Pt states she twisted her L ankle at work today. Ambulatory with limp

## 2018-07-27 NOTE — ED Provider Notes (Signed)
[facility]    CSN: [id number] Arrival date & time: 07/27/18  1902     History   Chief Complaint Chief Complaint  Patient presents with  . Ankle Pain    HPI Olivia Kelly is a 32 y.o. female.   31 year old female comes in for left ankle pain after injury at work today.  States she inverted ankle, causing pain to the dorsal aspect of the foot and numbness and tingling.  Painful weightbearing without obvious swelling.  No obvious decrease in ROM.  Has not taken anything for the symptoms.     Past Medical History:  Diagnosis Date  . Seizures (HCC)    last seizure 2015, on meds    Patient Active Problem List   Diagnosis Date Noted  . Anemia due to blood loss, acute 04/03/2016  . 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 Seizures (HCC) 04/04/2013    Past Surgical History:  Procedure Laterality Date  . cyst removed middle of chest at 70-20 years old    . CYSTOSCOPY WITH RETROGRADE PYELOGRAM, URETEROSCOPY AND STENT PLACEMENT Left 10/29/2014   Procedure: CYSTOSCOPY WITH LEFT RETROGRADE PYELOGRAM,  DIAGNOSTIC LEFT  URETEROSCOPY AND STENT PLACEMENT;  Surgeon: Sebastian Ache, MD;  Location: [facility];  Service: Urology;  Laterality: Left;  . CYSTOSCOPY WITH RETROGRADE PYELOGRAM, URETEROSCOPY AND STENT PLACEMENT Left 01/23/2015   Procedure: CYSTOSCOPY WITH LEFT RETROGRADE PYELOGRAM/URETERAL STENT PLACEMENT;  Surgeon: Sebastian Ache, MD;  Location: [facility];  Service: Urology;  Laterality: Left;  . IUD REMOVAL N/A 06/09/2015   Procedure: INTRAUTERINE DEVICE (IUD) REMOVAL;  Surgeon: Allie Bossier, MD;  Location: [facility];  Service: Gynecology;  Laterality: N/A;  . NEPHRECTOMY     half of left kidney removed  . ROBOT ASSISTED PYELOPLASTY Left 01/23/2015   Procedure: ROBOTIC ASSISTED PYELOPLASTY WITH STENT PLACEMENT;  Surgeon: Sebastian Ache, MD;  Location: [facility];  Service: Urology;  Laterality: Left;    OB  History    Gravida  2   Para  2   Term  2   Preterm  0   AB  0   Living  2     SAB  0   TAB  0   Ectopic  0   Multiple  0   Live Births  2            Home Medications    Prior to Admission medications   Medication Sig Start Date End Date Taking? Authorizing Provider  ferrous sulfate 325 (65 FE) MG tablet Take 1 tablet (325 mg total) by mouth daily with breakfast. Patient not taking: Reported on 08/21/2016 04/03/16 04/03/17  [name], CNM  ibuprofen (ADVIL,MOTRIN) 600 MG tablet Take 1 tablet (600 mg total) by mouth every 6 (six) hours as needed. Patient not taking: Reported on 08/21/2016 04/03/16   [name], CNM  topiramate (TOPAMAX) 100 MG tablet Take 1 tablet (100 mg total) by mouth 2 (two) times daily. 08/21/16   Zadie Rhine, MD    Family History Family History  Problem Relation Age of Onset  . Diabetes Mother   . Hypertension Mother   . Diabetes Father   . Cancer Other     Social History Social History   Tobacco Use  . Smoking status: Never Smoker  . Smokeless tobacco: Never Used  Substance Use Topics  . Alcohol use: Yes    Comment: social  . Drug use: No  Allergies   Patient has no known allergies.   Review of Systems Review of Systems  Reason unable to perform ROS: See HPI as above.     Physical Exam Triage Vital Signs ED Triage Vitals [07/27/18 1930]  Enc Vitals Group     BP 116/75     Pulse Rate 67     Resp 16     Temp 98.5 F (36.9 C)     Temp src      SpO2 100 %     Weight      Height      Head Circumference      Peak Flow      Pain Score 4     Pain Loc      Pain Edu?      Excl. in GC?    No data found.  Updated Vital Signs BP 116/75   Pulse 67   Temp 98.5 F (36.9 C)   Resp 16   LMP 07/21/2018   SpO2 100%   Visual Acuity Right Eye Distance:   Left Eye Distance:   Bilateral Distance:    Right Eye Near:   Left Eye Near:    Bilateral Near:     Physical Exam  Constitutional: She is  oriented to person, place, and time. She appears well-developed and well-nourished. No distress.  HENT:  Head: Normocephalic and atraumatic.  Eyes: Pupils are equal, round, and reactive to light. Conjunctivae are normal.  Musculoskeletal:  No obvious swelling, erythema, warmth, contusion.  No tenderness to palpation of the ankle and foot.  Numbness tingling to the dorsal and ventral aspect of the foot, but in sensation intact and equal bilaterally to the toes.  Full range of motion of ankle and toes.  Strength normal and equal bilaterally.  Pedal pulse 2+, cap refill less than 2 seconds.  Neurological: She is alert and oriented to person, place, and time.  Skin: She is not diaphoretic.     UC Treatments / Results  Labs (all labs ordered are listed, but only abnormal results are displayed) Labs Reviewed - No data to display  EKG None  Radiology Dg Ankle Complete Left  Result Date: 07/27/2018 CLINICAL DATA:  Twisted ankle today, injury. EXAM: LEFT ANKLE COMPLETE - 3+ VIEW COMPARISON:  Plain film of the LEFT ankle dated 08/20/2012. FINDINGS: Osseous alignment is normal. Ankle mortise is symmetric. No fracture line or displaced fracture fragment seen. No degenerative change. Visualized portions of the hindfoot and midfoot appear intact and normally aligned. Soft tissues about the LEFT ankle are unremarkable. IMPRESSION: Negative. Electronically Signed   By: Bary RichardStan  Maynard M.D.   On: 07/27/2018 20:12    Procedures Procedures (including critical care time)  Medications Ordered in UC Medications - No data to display  Initial Impression / Assessment and Plan / UC Course  I have reviewed the triage vital signs and the nursing notes.  Pertinent labs & imaging results that were available during my care of the patient were reviewed by me and considered in my medical decision making (see chart for details).    X-ray negative for fracture or dislocation.  NSAIDs, ice compress, elevation, ankle  brace during activity.  Patient to follow-up with orthopedic for further evaluation of numbness/tingling does not resolve.  Final Clinical Impressions(s) / UC Diagnoses   Final diagnoses:  Acute left ankle pain    ED Prescriptions    None        Belinda FisherYu, Kindell Strada V, Cordelia Poche-C 07/27/18 2028

## 2018-07-27 NOTE — Discharge Instructions (Signed)
X-ray negative for fracture or dislocation.  Can take ibuprofen 800 mg 3 times a day to help with pain.  Ice compress, elevation, ankle brace during activity.  Follow-up with orthopedic for further evaluation of numbness/tingling does not resolve.

## 2018-11-06 ENCOUNTER — Emergency Department (HOSPITAL_COMMUNITY)
Admission: EM | Admit: 2018-11-06 | Discharge: 2018-11-06 | Disposition: A | Discharge status: Home / Self Care | Payer: Self-pay | Attending: Emergency Medicine | Admitting: Emergency Medicine

## 2018-11-06 ENCOUNTER — Emergency Department (HOSPITAL_COMMUNITY): Payer: Self-pay

## 2018-11-06 ENCOUNTER — Encounter (HOSPITAL_COMMUNITY): Payer: Self-pay | Admitting: *Deleted

## 2018-11-06 ENCOUNTER — Other Ambulatory Visit: Payer: Self-pay

## 2018-11-06 DIAGNOSIS — Z79899 Other long term (current) drug therapy: Secondary | ICD-10-CM | POA: Insufficient documentation

## 2018-11-06 DIAGNOSIS — N3000 Acute cystitis without hematuria: Secondary | ICD-10-CM | POA: Insufficient documentation

## 2018-11-06 DIAGNOSIS — J069 Acute upper respiratory infection, unspecified: Secondary | ICD-10-CM | POA: Insufficient documentation

## 2018-11-06 DIAGNOSIS — R0789 Other chest pain: Secondary | ICD-10-CM

## 2018-11-06 LAB — COMPREHENSIVE METABOLIC PANEL
ALT: 16 U/L (ref 0–44)
ANION GAP: 6 (ref 5–15)
AST: 16 U/L (ref 15–41)
Albumin: 4.2 g/dL (ref 3.5–5.0)
Alkaline Phosphatase: 49 U/L (ref 38–126)
BUN: 10 mg/dL (ref 6–20)
CO2: 24 mmol/L (ref 22–32)
Calcium: 9.1 mg/dL (ref 8.9–10.3)
Chloride: 108 mmol/L (ref 98–111)
Creatinine, Ser: 0.73 mg/dL (ref 0.44–1.00)
GFR calc Af Amer: >60 mL/min (ref >60)
GFR calc non Af Amer: >60 mL/min (ref >60)
Glucose, Bld: 92 mg/dL (ref 70–99)
Potassium: 3.4 mmol/L — ABNORMAL LOW (ref 3.5–5.1)
Sodium: 138 mmol/L (ref 135–145)
Total Bilirubin: 0.4 mg/dL (ref 0.3–1.2)
Total Protein: 7.3 g/dL (ref 6.5–8.1)

## 2018-11-06 LAB — URINALYSIS, ROUTINE W REFLEX MICROSCOPIC
Bilirubin Urine: NEGATIVE
GLUCOSE, UA: NEGATIVE mg/dL
Hgb urine dipstick: NEGATIVE
Ketones, ur: NEGATIVE mg/dL
Nitrite: NEGATIVE
PH: 8 (ref 5.0–8.0)
Protein, ur: NEGATIVE mg/dL
Specific Gravity, Urine: 1.014 (ref 1.005–1.030)

## 2018-11-06 LAB — I-STAT TROPONIN, ED: Troponin i, poc: 0 ng/mL (ref 0.00–0.08)

## 2018-11-06 LAB — CBC
HCT: 39.5 % (ref 36.0–46.0)
Hemoglobin: 12.7 g/dL (ref 12.0–15.0)
MCH: 29.1 pg (ref 26.0–34.0)
MCHC: 32.2 g/dL (ref 30.0–36.0)
MCV: 90.6 fL (ref 80.0–100.0)
PLATELETS: 334 10*3/uL (ref 150–400)
RBC: 4.36 MIL/uL (ref 3.87–5.11)
RDW: 12 % (ref 11.5–15.5)
WBC: 6.3 10*3/uL (ref 4.0–10.5)
nRBC: 0 % (ref 0.0–0.2)

## 2018-11-06 LAB — LIPASE, BLOOD: Lipase: 34 U/L (ref 11–51)

## 2018-11-06 LAB — I-STAT BETA HCG BLOOD, ED (MC, WL, AP ONLY): I-stat hCG, quantitative: <5 m[IU]/mL (ref <5)

## 2018-11-06 MED ORDER — DEXAMETHASONE SODIUM PHOSPHATE 10 MG/ML IJ SOLN
10.0000 mg | Freq: Once | INTRAMUSCULAR | Status: AC
  Administered 2018-11-06: 10 mg via INTRAMUSCULAR

## 2018-11-06 MED ORDER — DEXAMETHASONE SODIUM PHOSPHATE 10 MG/ML IJ SOLN
10.0000 mg | Freq: Once | INTRAMUSCULAR | Status: DC
  Filled 2018-11-06: qty 1

## 2018-11-06 MED ORDER — SODIUM CHLORIDE 0.9% FLUSH: 3.0000 mL | Freq: Once | INTRAVENOUS | Status: DC

## 2018-11-06 MED ORDER — CEPHALEXIN 500 MG PO CAPS: 500.0000 mg | ORAL_CAPSULE | Freq: Two times a day (BID) | ORAL | 0 refills | Status: AC

## 2018-11-06 NOTE — ED Triage Notes (Signed)
Pt reports nasal congestion, cough, pain with inspiration since over the weekend. No fevers. Lower midline abdominal pain that started this morning, associated with nausea. No urinary or vaginal symptoms.

## 2018-11-06 NOTE — ED Provider Notes (Signed)
[facility]EMERGENCY [facility]PT Provider Note   CSN: [id number] Arrival date & time: 11/06/18  1840    History   Chief Complaint Chief Complaint  Patient presents with  . Abdominal Pain  . Cough    HPI Olivia Kelly is a 33 y.o. female.     HPI   1.5wk cough, then trouble breathing started over the weekend, feels like when does activities, went to trampoline park Saturday and chest felt tight then started to hurt and have trouble breathing, when sat down for a while and tried to catch breath felt better, feels like sharp stabbing pain, middle of chest, when breathe feels tightness, laying down will also make chest tight.  Cough with mucus, no blood No fevers Has had nasal congestion, 1.5wk, mom has been sick as well From coughing a lot has sore throat  No hx of wheezing or asthma No smoking No OCP, no hx of DVT/PE, no recent travel/surgeries  Mom had stent placed in 30s or 40s, Grandma also diagnosed with early CAD  Abdominal pain started this AM, lower abdomen, no vomiting or diarrhea, no fevers, no dyuria, no vaginal discharge or bleeding     Past Medical History:  Diagnosis Date  . Seizures (HCC)    last seizure 2015, on meds    Patient Active Problem List   Diagnosis Date Noted  . Anemia due to blood loss, acute 04/03/2016  . 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 Seizures (HCC) 04/04/2013    Past Surgical History:  Procedure Laterality Date  . cyst removed middle of chest at 43-59 years old    . CYSTOSCOPY WITH RETROGRADE PYELOGRAM, URETEROSCOPY AND STENT PLACEMENT Left 10/29/2014   Procedure: CYSTOSCOPY WITH LEFT RETROGRADE PYELOGRAM,  DIAGNOSTIC LEFT  URETEROSCOPY AND STENT PLACEMENT;  Surgeon: Sebastian Ache, MD;  Location: [facility];  Service: Urology;  Laterality: Left;  . CYSTOSCOPY WITH RETROGRADE PYELOGRAM, URETEROSCOPY AND STENT PLACEMENT Left 01/23/2015   Procedure: CYSTOSCOPY WITH LEFT RETROGRADE PYELOGRAM/URETERAL STENT PLACEMENT;  Surgeon: Sebastian Ache, MD;  Location: [facility];  Service: Urology;  Laterality: Left;  . IUD REMOVAL N/A 06/09/2015   Procedure: INTRAUTERINE DEVICE (IUD) REMOVAL;  Surgeon: Allie Bossier, MD;  Location: [facility];  Service: Gynecology;  Laterality: N/A;  . NEPHRECTOMY     half of left kidney removed  . ROBOT ASSISTED PYELOPLASTY Left 01/23/2015   Procedure: ROBOTIC ASSISTED PYELOPLASTY WITH STENT PLACEMENT;  Surgeon: Sebastian Ache, MD;  Location: [facility];  Service: Urology;  Laterality: Left;     OB History    Gravida  2   Para  2   Term  2   Preterm  0   AB  0   Living  2     SAB  0   TAB  0   Ectopic  0   Multiple  0   Live Births  2            Home Medications    Prior to Admission medications   Medication Sig Start Date End Date Taking? Authorizing Provider  Cranberry 450 MG TABS Take 450 mg by mouth daily.   Yes [provider]  topiramate (TOPAMAX) 100 MG tablet Take 1 tablet (100 mg total) by mouth 2 (two) times daily. 08/21/16  Yes Zadie Rhine, MD  cephALEXin (KEFLEX) 500 MG capsule Take 1 capsule (500 mg total) by mouth 2 (two) times daily for 7 days. 11/06/18  11/13/18  Alvira Monday, MD  ferrous sulfate 325 (65 FE) MG tablet Take 1 tablet (325 mg total) by mouth daily with breakfast. Patient not taking: Reported on 08/21/2016 04/03/16 04/03/17  [name], CNM  ibuprofen (ADVIL,MOTRIN) 600 MG tablet Take 1 tablet (600 mg total) by mouth every 6 (six) hours as needed. Patient not taking: Reported on 08/21/2016 04/03/16   [name], CNM    Family History Family History  Problem Relation Age of Onset  . Diabetes Mother   . Hypertension Mother   . Diabetes Father   . Cancer Other     Social History Social History   Tobacco Use  . Smoking status: Never Smoker  . Smokeless tobacco: Never Used  Substance Use Topics  . Alcohol use: Yes    Comment: social    . Drug use: No     Allergies   Patient has no known allergies.   Review of Systems Review of Systems  Constitutional: Negative for fever.  HENT: Positive for congestion and sore throat.   Respiratory: Positive for cough.   Cardiovascular: Positive for chest pain.  Gastrointestinal: Positive for abdominal pain. Negative for nausea and vomiting.  Genitourinary: Negative for difficulty urinating and dysuria.  Musculoskeletal: Negative for back pain.  Skin: Negative for rash.  Neurological: Negative for headaches.     Physical Exam Updated Vital Signs BP 109/68   Pulse 65   Temp 98.4 F (36.9 C) (Oral)   Resp 17   Ht 5\' 3"  (1.6 m)   Wt 73.5 kg   LMP 10/23/2018   SpO2 100%   BMI 28.70 kg/m   Physical Exam Vitals signs and nursing note reviewed.  Constitutional:      General: She is not in acute distress.    Appearance: She is well-developed. She is not diaphoretic.  HENT:     Head: Normocephalic and atraumatic.  Eyes:     Conjunctiva/sclera: Conjunctivae normal.  Neck:     Musculoskeletal: Normal range of motion.  Cardiovascular:     Rate and Rhythm: Normal rate and regular rhythm.     Heart sounds: Normal heart sounds. No murmur. No friction rub. No gallop.   Pulmonary:     Effort: Pulmonary effort is normal. No respiratory distress.     Breath sounds: Normal breath sounds. No wheezing or rales.  Abdominal:     General: There is no distension.     Palpations: Abdomen is soft.     Tenderness: There is abdominal tenderness in the suprapubic area. There is no guarding.  Musculoskeletal:        General: No tenderness.  Skin:    General: Skin is warm and dry.     Findings: No erythema or rash.  Neurological:     Mental Status: She is alert and oriented to person, place, and time.      ED Treatments / Results  Labs (all labs ordered are listed, but only abnormal results are displayed) Labs Reviewed  COMPREHENSIVE METABOLIC PANEL - Abnormal; Notable for  the following components:      Result Value   Potassium 3.4 (*)    All other components within normal limits  URINALYSIS, ROUTINE W REFLEX MICROSCOPIC - Abnormal; Notable for the following components:   APPearance CLOUDY (*)    Leukocytes,Ua TRACE (*)    Bacteria, UA FEW (*)    All other components within normal limits  LIPASE, BLOOD  CBC  I-STAT BETA HCG BLOOD, ED ([facility], [facility] ONLY)  I-STAT TROPONIN, ED    EKG EKG Interpretation  Date/Time:  Tuesday November 06 2018 18:55:12 EST Ventricular Rate:  77 PR Interval:    QRS Duration: 76 QT Interval:  372 QTC Calculation: 421 R Axis:   24 Text Interpretation:  Sinus rhythm Low voltage, precordial leads Baseline wander in lead(s) II aVR aVF No significant change since last tracing Confirmed by Alvira Monday ([id number]) on 11/06/2018 10:26:09 PM   Radiology Dg Chest 2 View  Result Date: 11/06/2018 CLINICAL DATA:  Productive cough for 1-1/2 weeks. Lower abdominal pain for 4 days. EXAM: CHEST - 2 VIEW COMPARISON:  12/18/2014 FINDINGS: The heart size and mediastinal contours are within normal limits. Both lungs are clear. The visualized skeletal structures are unremarkable. IMPRESSION: No active cardiopulmonary disease. Electronically Signed   By: Tollie Eth M.D.   On: 11/06/2018 21:26    Procedures Procedures (including critical care time)  Medications Ordered in ED Medications  dexamethasone (DECADRON) injection 10 mg (10 mg Intramuscular Given 11/06/18 2305)     Initial Impression / Assessment and Plan / ED Course  I have reviewed the triage vital signs and the nursing notes.  Pertinent labs & imaging results that were available during my care of the patient were reviewed by me and considered in my medical decision making (see chart for details).        33yo female with history above presents with concern for cough, congestion, chest tightness and lower abdominal pain.  CXR without pneumonia. Labs without significant  abnormalities.  No sign of pericarditis.  PERC negative and low suspicion for PE in setting of significant viral URI symptoms.  Troponin negative, doubt ACS. She does have a family history of early CAD, however and outpt Cardiology follow up reasonable.   Symptoms likely secondary to viral URI. Given decadron.  Also with lower abdominal pain. Exam not consistent with appendicitis, diverticulitis. Urinalysis consistent with UTI.  GIven rx for keflex. Patient discharged in stable condition with understanding of reasons to return.   Final Clinical Impressions(s) / ED Diagnoses   Final diagnoses:  Upper respiratory tract infection, unspecified type  Chest tightness  Acute cystitis without hematuria    ED Discharge Orders         Ordered    cephALEXin (KEFLEX) 500 MG capsule  2 times daily     11/06/18 2300           Alvira Monday, MD 11/08/18 [phone]

## 2019-03-27 ENCOUNTER — Other Ambulatory Visit: Payer: Medicaid Other

## 2019-05-28 ENCOUNTER — Ambulatory Visit: Payer: Medicaid Other | Admitting: Student

## 2019-05-28 ENCOUNTER — Other Ambulatory Visit: Payer: Self-pay | Admitting: Student

## 2019-05-28 ENCOUNTER — Encounter: Payer: Self-pay | Admitting: Student

## 2019-05-28 ENCOUNTER — Other Ambulatory Visit (HOSPITAL_COMMUNITY)
Admission: RE | Admit: 2019-05-28 | Discharge: 2019-05-28 | Disposition: A | Discharge status: Home / Self Care | Payer: Medicaid Other | Source: Ambulatory Visit | Attending: Student | Admitting: Student

## 2019-05-28 ENCOUNTER — Other Ambulatory Visit: Payer: Self-pay

## 2019-05-28 VITALS — BP 114/77 | HR 72 | Wt 174.0 lb

## 2019-05-28 DIAGNOSIS — N939 Abnormal uterine and vaginal bleeding, unspecified: Secondary | ICD-10-CM

## 2019-05-28 LAB — POCT PREGNANCY, URINE: Preg Test, Ur: NEGATIVE

## 2019-05-28 NOTE — Patient Instructions (Signed)
Abnormal Uterine Bleeding Abnormal uterine bleeding is unusual bleeding from the uterus. It includes:  Bleeding or spotting between periods.  Bleeding after sex.  Bleeding that is heavier than normal.  Periods that last longer than usual.  Bleeding after menopause. Abnormal uterine bleeding can affect women at various stages in life, including teenagers, women in their reproductive years, pregnant women, and women who have reached menopause. Common causes of abnormal uterine bleeding include:  Pregnancy.  Growths of tissue (polyps).  A noncancerous tumor in the uterus (fibroid).  Infection.  Cancer.  Hormonal imbalances. Any type of abnormal bleeding should be evaluated by a health care provider. Many cases are minor and simple to treat, while others are more serious. Treatment will depend on the cause of the bleeding. Follow these instructions at home:  Monitor your condition for any changes.  Do not use tampons, douche, or have sex if told by your health care provider.  Change your pads often.  Get regular exams that include pelvic exams and cervical cancer screening.  Keep all follow-up visits as told by your health care provider. This is important. Contact a health care provider if:  Your bleeding lasts for more than one week.  You feel dizzy at times.  You feel nauseous or you vomit. Get help right away if:  You pass out.  Your bleeding soaks through a pad every hour.  You have abdominal pain.  You have a fever.  You become sweaty or weak.  You pass large blood clots from your vagina. Summary  Abnormal uterine bleeding is unusual bleeding from the uterus.  Any type of abnormal bleeding should be evaluated by a health care provider. Many cases are minor and simple to treat, while others are more serious.  Treatment will depend on the cause of the bleeding. This information is not intended to replace advice given to you by your health care provider.  Make sure you discuss any questions you have with your health care provider. Document Released: 08/22/2005 Document Revised: 11/29/2017 Document Reviewed: 09/23/2016 Elsevier Patient Education  2020 Elsevier Inc.  

## 2019-05-28 NOTE — Progress Notes (Signed)
  History:  [name] is a 33 y.o. [id number] who presents to clinic today for an irregular cycle.  She normally has monthly cycles that last 4-7 days and are not too heavy. Her last menses was 8/25-8/31. She then started bleeding again on 9/3. That bleeding episode lasted for 3 days & was heavier than her normal bleeding pattern. At that time, she made an appointment for evaluation as this hasn't occurred with her periods before. Since then has had no bleeding. Had associated abdominal cramping but states it was not worse than with her regular periods.  She is in a monogamous relationship with a female partner whom she's been with for 6 years. They do not use condoms. She denies dyspareunia or postcoital bleeding. No vaginal discharge. No change in her medications.     Patient Active Problem List   Diagnosis Date Noted  . Anemia due to blood loss, acute 04/03/2016  . 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 Seizures ([place]) 04/04/2013    No Known Allergies  Current Outpatient Medications on File Prior to Visit  Medication Sig Dispense Refill  . topiramate (TOPAMAX) 100 MG tablet Take 1 tablet (100 mg total) by mouth 2 (two) times daily. 28 tablet 0  . ibuprofen (ADVIL,MOTRIN) 600 MG tablet Take 1 tablet (600 mg total) by mouth every 6 (six) hours as needed. (Patient not taking: Reported on 08/21/2016) 30 tablet 2   No current facility-administered medications on file prior to visit.      The following portions of the patient's history were reviewed and updated as appropriate: allergies, current medications, family history, past medical history, social history, past surgical history and problem list.  Review of Systems:  Other than those mentioned in HPI all ROS negative   Objective:  Physical Exam BP 114/77   Pulse 72   Wt 174 lb (78.9 kg)   LMP 04/30/2019 (Exact Date)   BMI 30.82 kg/m  CONSTITUTIONAL: Well-developed,  well-nourished female in no acute distress.  EYES: EOM intact, conjunctivae normal, no scleral icterus HEAD: Normocephalic, atraumatic  RESPIRATORY:  Effort normal, no problems with respiration noted. GASTROINTESTINAL:Soft, normal bowel sounds, no distention noted.  No tenderness, rebound or guarding.  GENITOURINARY: Normal appearing external genitalia; normal appearing vaginal mucosa and cervix.  Normal appearing discharge. Normal uterine size, no other palpable masses, no uterine or adnexal tenderness.  SKIN: Skin is warm and dry. No rash noted. Not diaphoretic. No erythema. No pallor. PSYCHIATRIC: Normal mood and affect. Normal behavior. Normal judgment and thought content.    Labs and Imaging UPT negative GC/CT & wet prep  Pap smear is up to date  Assessment & Plan:  1. Abnormal uterine bleeding (AUB) -Pt has a period app on her phone & will continue to track her bleeding episodes. If irregularity continues she will f/u in the office for further evaluation - [facility]   Jorje Guild, NP 05/29/2019 9:19 AM

## 2019-05-29 LAB — CERVICOVAGINAL ANCILLARY ONLY
Bacterial Vaginitis (gardnerella): NEGATIVE
Candida Glabrata: NEGATIVE
Candida Vaginitis: NEGATIVE
Molecular Disclaimer: NEGATIVE
Molecular Disclaimer: NEGATIVE
Molecular Disclaimer: NEGATIVE
Molecular Disclaimer: NORMAL
Trichomonas: NEGATIVE

## 2019-05-30 LAB — CERVICOVAGINAL ANCILLARY ONLY
Chlamydia: NEGATIVE
Neisseria Gonorrhea: NEGATIVE

## 2019-09-25 ENCOUNTER — Ambulatory Visit: Payer: Medicaid Other | Attending: Internal Medicine

## 2019-09-25 ENCOUNTER — Other Ambulatory Visit: Payer: Self-pay

## 2019-09-25 DIAGNOSIS — Z20822 Contact with and (suspected) exposure to covid-19: Secondary | ICD-10-CM

## 2019-09-26 LAB — NOVEL CORONAVIRUS, NAA: SARS-CoV-2, NAA: DETECTED — AB

## 2019-11-20 ENCOUNTER — Emergency Department (HOSPITAL_COMMUNITY)
Admission: EM | Admit: 2019-11-20 | Discharge: 2019-11-21 | Disposition: A | Discharge status: Home / Self Care | Payer: Medicaid Other | Attending: Emergency Medicine | Admitting: Emergency Medicine

## 2019-11-20 ENCOUNTER — Emergency Department (HOSPITAL_COMMUNITY): Payer: Medicaid Other

## 2019-11-20 DIAGNOSIS — Y939 Activity, unspecified: Secondary | ICD-10-CM | POA: Insufficient documentation

## 2019-11-20 DIAGNOSIS — Y929 Unspecified place or not applicable: Secondary | ICD-10-CM | POA: Insufficient documentation

## 2019-11-20 DIAGNOSIS — Y999 Unspecified external cause status: Secondary | ICD-10-CM | POA: Diagnosis not present

## 2019-11-20 DIAGNOSIS — S0101XA Laceration without foreign body of scalp, initial encounter: Secondary | ICD-10-CM

## 2019-11-20 DIAGNOSIS — X58XXXA Exposure to other specified factors, initial encounter: Secondary | ICD-10-CM | POA: Insufficient documentation

## 2019-11-20 DIAGNOSIS — R569 Unspecified convulsions: Secondary | ICD-10-CM | POA: Diagnosis not present

## 2019-11-20 DIAGNOSIS — Z79899 Other long term (current) drug therapy: Secondary | ICD-10-CM | POA: Insufficient documentation

## 2019-11-20 LAB — BASIC METABOLIC PANEL
Anion gap: 10 (ref 5–15)
BUN: 9 mg/dL (ref 6–20)
CO2: 22 mmol/L (ref 22–32)
Calcium: 9.1 mg/dL (ref 8.9–10.3)
Chloride: 104 mmol/L (ref 98–111)
Creatinine, Ser: 0.7 mg/dL (ref 0.44–1.00)
GFR calc Af Amer: >60 mL/min (ref >60)
GFR calc non Af Amer: >60 mL/min (ref >60)
Glucose, Bld: 100 mg/dL — ABNORMAL HIGH (ref 70–99)
Potassium: 3.6 mmol/L (ref 3.5–5.1)
Sodium: 136 mmol/L (ref 135–145)

## 2019-11-20 LAB — CBC WITH DIFFERENTIAL/PLATELET
Abs Immature Granulocytes: 0.02 10*3/uL (ref 0.00–0.07)
Basophils Absolute: 0 10*3/uL (ref 0.0–0.1)
Basophils Relative: 0 %
Eosinophils Absolute: 0 10*3/uL (ref 0.0–0.5)
Eosinophils Relative: 0 %
HCT: 39.1 % (ref 36.0–46.0)
Hemoglobin: 13 g/dL (ref 12.0–15.0)
Immature Granulocytes: 0 %
Lymphocytes Relative: 12 %
Lymphs Abs: 1.1 10*3/uL (ref 0.7–4.0)
MCH: 28.9 pg (ref 26.0–34.0)
MCHC: 33.2 g/dL (ref 30.0–36.0)
MCV: 86.9 fL (ref 80.0–100.0)
Monocytes Absolute: 0.7 10*3/uL (ref 0.1–1.0)
Monocytes Relative: 8 %
Neutro Abs: 7.1 10*3/uL (ref 1.7–7.7)
Neutrophils Relative %: 80 %
Platelets: 279 10*3/uL (ref 150–400)
RBC: 4.5 MIL/uL (ref 3.87–5.11)
RDW: 12.8 % (ref 11.5–15.5)
WBC: 9 10*3/uL (ref 4.0–10.5)
nRBC: 0 % (ref 0.0–0.2)

## 2019-11-20 LAB — I-STAT BETA HCG BLOOD, ED (MC, WL, AP ONLY): I-stat hCG, quantitative: <5 m[IU]/mL (ref <5)

## 2019-11-20 MED ORDER — SODIUM CHLORIDE 0.9 % IV BOLUS: 500.0000 mL | Freq: Once | INTRAVENOUS | Status: DC

## 2019-11-20 MED ORDER — ONDANSETRON HCL 4 MG/2ML IJ SOLN
4.0000 mg | Freq: Once | INTRAMUSCULAR | Status: AC
  Administered 2019-11-20: 4 mg via INTRAVENOUS
  Filled 2019-11-20: qty 2

## 2019-11-20 MED ORDER — LIDOCAINE-EPINEPHRINE (PF) 2 %-1:200000 IJ SOLN
10.0000 mL | Freq: Once | INTRAMUSCULAR | Status: AC
  Administered 2019-11-21: 10 mL
  Filled 2019-11-20: qty 20

## 2019-11-20 NOTE — Discharge Instructions (Addendum)
Please take Tylenol (acetaminophen) to relieve your pain.  You may take tylenol, up to 1,000 mg (two extra strength pills).  Do not take more than 3,000 mg tylenol in a 24 hour period.  Please check all medication labels as many medications such as pain and cold medications may contain tylenol. Please do not drink alcohol while taking this medication.  ° °

## 2019-11-20 NOTE — ED Triage Notes (Signed)
Pt arrives via [facility] from work.   Pt has hx of seizures, reports seeing a blur and feeling hot before she had a witnessed grand mal seizure. Pt hit head, sustains a lac to back of head  Pt a&ox4 on arrival Complaint with seizure medications at home, believes this episode is because she was sleep deprived and stressed.

## 2019-11-20 NOTE — ED Provider Notes (Signed)
[facility] EMERGENCY DEPARTMENT Provider Note   CSN: [id number] Arrival date & time: 11/20/19  2048     History Chief Complaint  Patient presents with  . Seizures    Olivia Kelly is a 34 y.o. female with past medical history of seizures who presents today for evaluation of a seizure.  She reports that she is taking all of her medications. She states that she feels sleep deprived and has been having a difficult time sleeping recently so she had been drinking energy drinks.  She states that she had a new energy drink that she never had before, she is unsure of the exact notes that it contained the word adrenaline.  She has any fevers, cough, or shortness of breath.  She reportedly started feeling hot and flushed, which she normally does before a seizure, and then fell backwards striking the back left side of her head.  She reportedly had a generalized tonic-clonic seizure lasting approximately 2 minutes. Patient denies biting her tongue.  She thinks that she had the seizure because she is tired, not sleeping well, and stressed out.  She denies any stimulants other than the energy drink.  She denies any dysuria increased frequency or urgency.  No concern for infection at this time.  She has not had any changes to her antiepileptic therapy.      HPI     Past Medical History:  Diagnosis Date  . Seizures ([facility])    last seizure 2015, on meds    Patient Active Problem List   Diagnosis Date Noted  . Anemia due to blood loss, acute 04/03/2016  . 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 Seizures ([facility]) 04/04/2013    Past Surgical History:  Procedure Laterality Date  . cyst removed middle of chest at 102-28 years old    . CYSTOSCOPY WITH RETROGRADE PYELOGRAM, URETEROSCOPY AND STENT PLACEMENT Left 10/29/2014   Procedure: CYSTOSCOPY WITH LEFT RETROGRADE PYELOGRAM,  DIAGNOSTIC LEFT  URETEROSCOPY AND STENT PLACEMENT;   Surgeon: Sebastian Ache, MD;  Location: [facility];  Service: Urology;  Laterality: Left;  . CYSTOSCOPY WITH RETROGRADE PYELOGRAM, URETEROSCOPY AND STENT PLACEMENT Left 01/23/2015   Procedure: CYSTOSCOPY WITH LEFT RETROGRADE PYELOGRAM/URETERAL STENT PLACEMENT;  Surgeon: Sebastian Ache, MD;  Location: [facility];  Service: Urology;  Laterality: Left;  . IUD REMOVAL N/A 06/09/2015   Procedure: INTRAUTERINE DEVICE (IUD) REMOVAL;  Surgeon: Allie Bossier, MD;  Location: [facility];  Service: Gynecology;  Laterality: N/A;  . NEPHRECTOMY     half of left kidney removed  . ROBOT ASSISTED PYELOPLASTY Left 01/23/2015   Procedure: ROBOTIC ASSISTED PYELOPLASTY WITH STENT PLACEMENT;  Surgeon: Sebastian Ache, MD;  Location: [facility];  Service: Urology;  Laterality: Left;     OB History    Gravida  2   Para  2   Term  2   Preterm  0   AB  0   Living  2     SAB  0   TAB  0   Ectopic  0   Multiple  0   Live Births  2           Family History  Problem Relation Age of Onset  . Diabetes Mother   . Hypertension Mother   . Diabetes Father   . Kidney failure Father   . Cancer Other     Social History   Tobacco Use  . Smoking status: Never  Smoker  . Smokeless tobacco: Never Used  Substance Use Topics  . Alcohol use: Yes    Comment: social  . Drug use: No    Home Medications Prior to Admission medications   Medication Sig Start Date End Date Taking? Authorizing Provider  topiramate (TOPAMAX) 100 MG tablet Take 1 tablet (100 mg total) by mouth 2 (two) times daily. 08/21/16  Yes Ripley Fraise, MD  ibuprofen (ADVIL,MOTRIN) 600 MG tablet Take 1 tablet (600 mg total) by mouth every 6 (six) hours as needed. Patient not taking: Reported on 08/21/2016 04/03/16   Donnel Saxon, CNM    Allergies    Patient has no known allergies.  Review of Systems   Review of Systems  Constitutional: Negative for chills and fever.  Respiratory: Negative for cough and shortness of breath.     Skin: Positive for wound.  Neurological: Positive for headaches. Negative for weakness.  All other systems reviewed and are negative.   Physical Exam Updated Vital Signs BP 100/63   Pulse 75   Temp 99.9 F (37.7 C) (Oral)   Resp 18   Ht 5' 2.5" (1.588 m)   Wt 74.8 kg   SpO2 100%   BMI 29.70 kg/m   Physical Exam Vitals and nursing note reviewed.  Constitutional:      General: She is not in acute distress.    Appearance: She is well-developed.  HENT:     Head: Normocephalic.     Comments: Large hematoma on the back of her head on right side.  approx 10 cm diameter    Nose: Nose normal.     Mouth/Throat:     Mouth: Mucous membranes are moist.  Eyes:     Conjunctiva/sclera: Conjunctivae normal.  Neck:     Comments: c-collar in place Cardiovascular:     Rate and Rhythm: Normal rate and regular rhythm.     Pulses: Normal pulses.     Heart sounds: Normal heart sounds. No murmur.  Pulmonary:     Effort: Pulmonary effort is normal. No respiratory distress.     Breath sounds: Normal breath sounds.  Abdominal:     Palpations: Abdomen is soft.     Tenderness: There is no abdominal tenderness.  Skin:    General: Skin is warm and dry.  Neurological:     General: No focal deficit present.     Mental Status: She is alert and oriented to person, place, and time. Mental status is at baseline.  Psychiatric:        Mood and Affect: Mood normal.        Behavior: Behavior normal.         ED Results / Procedures / Treatments   Labs (all labs ordered are listed, but only abnormal results are displayed) Labs Reviewed  URINALYSIS, ROUTINE W REFLEX MICROSCOPIC - Abnormal; Notable for the following components:      Result Value   APPearance CLOUDY (*)    Hgb urine dipstick SMALL (*)    Ketones, ur 20 (*)    Protein, ur 30 (*)    Nitrite POSITIVE (*)    Leukocytes,Ua MODERATE (*)    WBC, UA >50 (*)    Bacteria, UA RARE (*)    Squamous Epithelial / LPF >50 (*)    All  other components within normal limits  BASIC METABOLIC PANEL - Abnormal; Notable for the following components:   Glucose, Bld 100 (*)    All other components within normal limits  CBC WITH DIFFERENTIAL/PLATELET  I-STAT BETA HCG BLOOD, ED ([facility], [facility], [facility] ONLY)    EKG None  Radiology CT Head Wo Contrast  Result Date: 11/20/2019 CLINICAL DATA:  Headache history of seizure laceration to back of head EXAM: CT HEAD WITHOUT CONTRAST CT CERVICAL SPINE WITHOUT CONTRAST TECHNIQUE: Multidetector CT imaging of the head and cervical spine was performed following the standard protocol without intravenous contrast. Multiplanar CT image reconstructions of the cervical spine were also generated. COMPARISON:  CT brain 04/29/2006 FINDINGS: CT HEAD FINDINGS Brain: No acute territorial infarction, hemorrhage or intracranial mass. The ventricles are nonenlarged Vascular: No hyperdense vessels.  Carotid vascular calcification. Skull: Normal. Negative for fracture or focal lesion. Sinuses/Orbits: No acute finding. Other: Right posterior parietal scalp hematoma and laceration CT CERVICAL SPINE FINDINGS Alignment: Straightening of the cervical spine. No subluxation. Facet alignment within normal limits. Mild rotation of C1 on C2, probably positional. Skull base and vertebrae: Vertebral body heights are maintained. Incomplete fusion posterior arch of C1. Soft tissues and spinal canal: No prevertebral fluid or swelling. No visible canal hematoma. Disc levels:  Disc spaces are maintained Upper chest: Negative. Other: None IMPRESSION: 1. Negative non contrasted CT appearance of the brain 2. Straightening of the cervical spine.  No fracture is seen. Electronically Signed   By: Jasmine Pang M.D.   On: 11/20/2019 22:48   CT Cervical Spine Wo Contrast  Result Date: 11/20/2019 CLINICAL DATA:  Headache history of seizure laceration to back of head EXAM: CT HEAD WITHOUT CONTRAST CT CERVICAL SPINE WITHOUT CONTRAST TECHNIQUE: Multidetector  CT imaging of the head and cervical spine was performed following the standard protocol without intravenous contrast. Multiplanar CT image reconstructions of the cervical spine were also generated. COMPARISON:  CT brain 04/29/2006 FINDINGS: CT HEAD FINDINGS Brain: No acute territorial infarction, hemorrhage or intracranial mass. The ventricles are nonenlarged Vascular: No hyperdense vessels.  Carotid vascular calcification. Skull: Normal. Negative for fracture or focal lesion. Sinuses/Orbits: No acute finding. Other: Right posterior parietal scalp hematoma and laceration CT CERVICAL SPINE FINDINGS Alignment: Straightening of the cervical spine. No subluxation. Facet alignment within normal limits. Mild rotation of C1 on C2, probably positional. Skull base and vertebrae: Vertebral body heights are maintained. Incomplete fusion posterior arch of C1. Soft tissues and spinal canal: No prevertebral fluid or swelling. No visible canal hematoma. Disc levels:  Disc spaces are maintained Upper chest: Negative. Other: None IMPRESSION: 1. Negative non contrasted CT appearance of the brain 2. Straightening of the cervical spine.  No fracture is seen. Electronically Signed   By: Jasmine Pang M.D.   On: 11/20/2019 22:48    Procedures .Marland KitchenLaceration Repair  Date/Time: 11/20/2019 11:58 PM Performed by: Cristina Gong, PA-C Authorized by: Cristina Gong, PA-C   Consent:    Consent obtained:  Verbal   Consent given by:  Patient   Risks discussed:  Infection, pain, poor cosmetic result, poor wound healing, retained foreign body, vascular damage, nerve damage and need for additional repair   Alternatives discussed:  No treatment, observation and referral Anesthesia (see MAR for exact dosages):    Anesthesia method:  Local infiltration   Local anesthetic:  Lidocaine 2% WITH epi Laceration details:    Location:  Scalp   Scalp location:  Occipital   Length (cm):  4 Repair type:    Repair type:   Simple Pre-procedure details:    Preparation:  Imaging obtained to evaluate for foreign bodies Exploration:    Hemostasis achieved with:  Epinephrine and direct pressure   Wound extent:  no foreign bodies/material noted and no underlying fracture noted   Treatment:    Area cleansed with:  Saline   Amount of cleaning:  Standard Skin repair:    Repair method:  Staples   Number of staples:  6 Approximation:    Approximation:  Close Post-procedure details:    Dressing:  Open (no dressing)   (including critical care time)  Medications Ordered in ED Medications  ondansetron (ZOFRAN) injection 4 mg (4 mg Intravenous Given 11/20/19 2113)  lidocaine-EPINEPHrine (XYLOCAINE W/EPI) 2 %-1:200000 (PF) injection 10 mL (10 mLs Infiltration Given 11/21/19 0055)    ED Course  I have reviewed the triage vital signs and the nursing notes.  Pertinent labs & imaging results that were available during my care of the patient were reviewed by me and considered in my medical decision making (see chart for details).    MDM Rules/Calculators/A&P                     Patient is a 34 year old woman who presents today for evaluation after a seizure.  She has had seizures her entire life.  She reports compliance with her medications at home.  She thinks that the seizure was caused by stress, and drinking energy drinks and poor sleep. She fell backwards striking her head. She has a 4 cm laceration on her scalp.  CT head and neck were obtained given her fall showing a hematoma without evidence of acute significant injuries or skull fracture. Labs are obtained and reviewed without significant hematologic or electrolyte derangements.  Wound was repaired using staples, please see procedure note.  Urine was obtained, however is significantly contaminated with over 50 skin cells.  Seizure precautions and follow up discussed.   Return precautions were discussed with patient who states their understanding.  At the  time of discharge patient denied any unaddressed complaints or concerns.  Patient is agreeable for discharge home.  Note: Portions of this report may have been transcribed using voice recognition software. Every effort was made to ensure accuracy; however, inadvertent computerized transcription errors may be present  Final Clinical Impression(s) / ED Diagnoses Final diagnoses:  Seizure (HCC)  Laceration of scalp without foreign body, initial encounter    Rx / DC Orders ED Discharge Orders    None       Norman Clay 11/21/19 1533    Linwood Dibbles, MD 11/21/19 [phone]

## 2019-11-21 ENCOUNTER — Telehealth: Payer: Self-pay | Admitting: Family Medicine

## 2019-11-21 LAB — URINALYSIS, ROUTINE W REFLEX MICROSCOPIC
Bilirubin Urine: NEGATIVE
Glucose, UA: NEGATIVE mg/dL
Ketones, ur: 20 mg/dL — AB
Nitrite: POSITIVE — AB
Protein, ur: 30 mg/dL — AB
Specific Gravity, Urine: 1.018 (ref 1.005–1.030)
Squamous Epithelial / HPF: >50 — ABNORMAL HIGH (ref 0–5)
WBC, UA: >50 WBC/hpf — ABNORMAL HIGH (ref 0–5)
pH: 6 (ref 5.0–8.0)

## 2019-11-21 NOTE — Telephone Encounter (Signed)
Patient called stating that she was seen at the ED last night and was referred to Dr Katrinka Blazing for the concussion clinic.  She was at work last night and had a seizure (she has a history of seizures). She fell backwards and hit her head on the ground.   She is still having headaches and seems off balance.   Patient can be reached at: [phone]

## 2019-11-25 ENCOUNTER — Other Ambulatory Visit: Payer: Self-pay

## 2019-11-25 ENCOUNTER — Ambulatory Visit (INDEPENDENT_AMBULATORY_CARE_PROVIDER_SITE_OTHER): Payer: Medicaid Other

## 2019-11-25 ENCOUNTER — Ambulatory Visit: Payer: Medicaid Other | Admitting: Family Medicine

## 2019-11-25 ENCOUNTER — Other Ambulatory Visit (HOSPITAL_COMMUNITY)
Admission: RE | Admit: 2019-11-25 | Discharge: 2019-11-25 | Disposition: A | Discharge status: Home / Self Care | Payer: Medicaid Other | Source: Ambulatory Visit | Attending: Student | Admitting: Student

## 2019-11-25 VITALS — Wt 168.9 lb

## 2019-11-25 DIAGNOSIS — Z113 Encounter for screening for infections with a predominantly sexual mode of transmission: Secondary | ICD-10-CM

## 2019-11-25 NOTE — Progress Notes (Deleted)
Subjective:    Chief Complaint: Olivia Kelly, LAT, ATC, am serving as scribe for Dr. [name].  Olivia Kelly, DOB: [birth year], is a 34 y.o. female who presents for evaluation of head injury following a tonic-clonic seizure she suffered on 11/20/19 when she fell and struck the L posterior aspect of her head, creating a laceration on her scalp. No chief complaint on file.   Injury date : *** Visit #: ***   History of Present Illness:    Concussion Self-Reported Symptom Score Symptoms rated on a scale 1-6, in last 24 hours   Headache: ***    Nausea: ***  Dizziness: ***  Vomiting: ***  Balance Difficulty: ***   Trouble Falling Asleep: ***   Fatigue: ***  Sleep Less Than Usual: ***  Daytime Drowsiness: ***  Sleep More Than Usual: ***  Photophobia: ***  Phonophobia: ***  Irritability: ***  Sadness: ***  Numbness or Tingling: ***  Nervousness: ***  Feeling More Emotional: ***  Feeling Mentally Foggy: ***  Feeling Slowed Down: ***  Memory Problems: ***  Difficulty Concentrating: ***  Visual Problems: ***   Total Symptom Score: *** Previous Symptom Score: ***   Neck Pain: Yes/No  Tinnitus: Yes/No  Review of Systems:  ***    Review of History: ***  Objective:    Physical Examination There were no vitals filed for this visit. MSK:  *** Neuro: *** Psych: ***   Concussion testing performed today:  I spent *** minutes with patient discussing test and results including review of history and patient chart and  integration of patient data, interpretation of standardized test results and clinical data, clinical decision making, treatment planning and report,and interactive feedback to the patient with all of patients questions answered.    Neurocognitive testing (ImPACT):  Post #1: *** Post #2: *** Post #3: ***  Verbal Memory Composite *** (***%) *** (***%) *** (***%)  Visual Memory Composite *** (***%) *** (***%) *** (***%)  Visual Motor Speed  Composite *** (***%) *** (***%) *** (***%)  Reaction Time Composite *** (***%) *** (***%) *** (***%)  Cognitive Efficiency Index *** ***  ***   Vestibular Screening:   Pre VOMS  HA Score: *** Pre VOMS  Dizziness Score: ***   Headache  Dizziness  Smooth Pursuits *** ***  H. Saccades *** ***  V. Saccades *** ***  H. VOR *** ***  V. VOR *** ***  Visual Motor Sensitivity *** ***      Convergence: *** cm  *** ***   Balance Screen: ***  Additional testing performed today:  Assessment and Plan   34 y.o. @GENDER @ with No diagnosis found.  presents with the following concussion subtypes. [] Cognitive [] Cervical [] Vestibular [] Ocular [] Migraine [] Anxiety/Mood   ***    Action/Discussion: Reviewed diagnosis, management options, expected outcomes, and the reasons for scheduled and emergent follow-up. Questions were adequately answered. Patient expressed verbal understanding and agreement with the following plan.     Patient Education:  Reviewed with patient the risks (i.e, a repeat concussion, post-concussion syndrome, second-impact syndrome) of returning to play prior to complete resolution, and thoroughly reviewed the signs and symptoms of concussion.Reviewed need for complete resolution of all symptoms, with rest AND exertion, prior to return to play.  Reviewed red flags for urgent medical evaluation: worsening symptoms, nausea/vomiting, intractable headache, musculoskeletal changes, focal neurological deficits.  [facility] Concussion Care Plan, which clearly outlines the plans stated above, was given to patient.   In addition to  the time spent performing tests, I spent *** min   Reviewed with patient the risks (i.e, a repeat concussion, post-concussion syndrome, second-impact syndrome) of returning to play prior to complete resolution, and thoroughly reviewed the signs and symptoms of      concussion. Reviewedf need for complete resolution  of all symptoms, with rest AND exertion, prior to return to play.  Reviewed red flags for urgent medical evaluation: worsening symptoms, nausea/vomiting, intractable headache, musculoskeletal changes, focal neurological deficits.  [facility] Concussion Care Plan, which clearly outlines the plans stated above, was given to patient   After Visit Summary printed out and provided to patient as appropriate.  The above documentation has been reviewed and is accurate and complete Wendy Poet

## 2019-11-25 NOTE — Progress Notes (Signed)
Chart reviewed for nurse visit. Agree with plan of care.   Trejan Buda, NP 11/25/2019 4:59 PM   

## 2019-11-25 NOTE — Progress Notes (Signed)
Pt here for STD Screening,advised test should come back in a couple of days, pt has My Chart, results will appear there but can always call also. Patient verbalized understanding

## 2019-11-26 LAB — HEPATITIS B SURFACE ANTIGEN: Hepatitis B Surface Ag: NEGATIVE

## 2019-11-26 LAB — RPR: RPR Ser Ql: NONREACTIVE

## 2019-11-26 LAB — HEPATITIS C ANTIBODY: Hep C Virus Ab: <0.1 s/co ratio (ref 0.0–0.9)

## 2019-11-26 LAB — HIV ANTIBODY (ROUTINE TESTING W REFLEX): HIV Screen 4th Generation wRfx: NONREACTIVE

## 2019-11-27 ENCOUNTER — Telehealth: Payer: Self-pay | Admitting: Lactation Services

## 2019-11-27 LAB — CERVICOVAGINAL ANCILLARY ONLY
Bacterial Vaginitis (gardnerella): POSITIVE — AB
Candida Glabrata: NEGATIVE
Candida Vaginitis: NEGATIVE
Chlamydia: NEGATIVE
Comment: NEGATIVE
Comment: NEGATIVE
Comment: NEGATIVE
Comment: NEGATIVE
Comment: NEGATIVE
Comment: NORMAL
Neisseria Gonorrhea: NEGATIVE
Trichomonas: NEGATIVE

## 2019-11-27 MED ORDER — METRONIDAZOLE 500 MG PO TABS: 500.0000 mg | ORAL_TABLET | Freq: Two times a day (BID) | ORAL | 0 refills | Status: DC

## 2019-11-27 NOTE — Telephone Encounter (Signed)
Called and spoke with patient and informed her she is + for BV. Patient reports she is having some vaginal discharge and odor. Flagyl prescription sent to Pharmacy. Patient denies allergies. Pt informed to take BID for 7 days and not to drink Alcohol while taking. Patient voiced understanding.

## 2019-11-28 ENCOUNTER — Encounter: Payer: Self-pay | Admitting: Family Medicine

## 2019-11-28 ENCOUNTER — Ambulatory Visit (INDEPENDENT_AMBULATORY_CARE_PROVIDER_SITE_OTHER): Payer: Medicaid Other | Admitting: Family Medicine

## 2019-11-28 ENCOUNTER — Other Ambulatory Visit: Payer: Self-pay

## 2019-11-28 VITALS — BP 102/74 | HR 87 | Ht 62.5 in | Wt 168.0 lb

## 2019-11-28 DIAGNOSIS — R569 Unspecified convulsions: Secondary | ICD-10-CM | POA: Diagnosis not present

## 2019-11-28 DIAGNOSIS — S060X9A Concussion with loss of consciousness of unspecified duration, initial encounter: Secondary | ICD-10-CM

## 2019-11-28 MED ORDER — TRAZODONE HCL 50 MG PO TABS: 50.0000 mg | ORAL_TABLET | Freq: Every day | ORAL | 1 refills | Status: DC

## 2019-11-28 NOTE — Progress Notes (Signed)
Subjective:    Chief Complaint: Olivia Kelly, LAT, ATC, am serving as scribe for Dr. [name].  Olivia Kelly, DOB: 1986/03/15, is a 34 y.o. female who presents for evaluation of possible concussion following a seizure she suffered on 11/20/19 while at work in which she fell backward and struck the back of her head on the floor, causing a laceration to her posterior scalp.  She was seen at the [facility] ED on 11/20/19 and has a CT of her head and C-spine.  Pt is followed by [facility] for her seizures.  Since her most recent seizure on 11/20/19 and associated head trauma, pt reports issues w/ HA, balance, dizziness and vertigo.  Patient also suffered a scalp laceration which was repaired with staples.  Last Tdap was 2019. Chief Complaint  Patient presents with  . New Patient (Initial Visit)    concussion    Injury date : 11/20/19 Visit #: 1   History of Present Illness:    Concussion Self-Reported Symptom Score Symptoms rated on a scale 1-6, in last 24 hours   Headache: 6    Nausea: 0  Dizziness: 4  Vomiting: 0  Balance Difficulty: 4   Trouble Falling Asleep: 6   Fatigue: 6  Sleep Less Than Usual: 6  Daytime Drowsiness: 3  Sleep More Than Usual: 4  Photophobia: 2  Phonophobia: 5  Irritability: 6  Sadness: 6  Numbness or Tingling: 0  Nervousness: 0  Feeling More Emotional: 6  Feeling Mentally Foggy: 3  Feeling Slowed Down: 6  Memory Problems: 6  Difficulty Concentrating: 3  Visual Problems: 2   Total # of Symptoms: 18/22 Total Symptom Score: 84/132 Previous Symptom Score: NA   Neck Pain: No  Tinnitus: No  Review of Systems: No fevers or chills.    Review of History: History of seizure disorder typically pretty well controlled.  Last seizure prior to this year was 2 years ago.  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  Objective:    Physical Examination Vitals:   11/28/19 0857  BP: 102/74  Pulse: 87  SpO2: 100%   MSK: C-spine  nontender.  Normal cervical motion.  Upper extremity strength reflexes and sensation are intact throughout. Neuro: Alert and oriented.  Normal coordination strength and sensation as above.  Impaired balance.  Normal double leg balance test impaired single-leg tandem balance test.  Normal finger-nose-finger.  Extraocular motion is intact. Psych: Alert and oriented normal speech affect thought process   Vestibular Screening:  Patient was unable to complete VOMS testing.  She had significant dizziness with vertical saccades.   Balance Screen: Normal double leg impaired tandem and single leg  Additional testing performed today:  Assessment and Plan   34 year old female with history of seizure disorder who presents with concussion after hitting her head during the seizure.  In the emergency department last week she had work-up including fortunately normal-appearing brain and C-spine CT scan.   As for concussion she still is pretty symptomatic but overall improving a bit.  Main issues today are headache dizziness and insomnia.  Plan for cognitive rest.  We will treat insomnia with trazodone.  Work note provided and will recheck back in about 2 weeks.  Patient has a follow-up appointment scheduled next week with her neurology team to address her seizures.  Hopefully coordinate care with neurology going forward.      Action/Discussion: Reviewed diagnosis, management options, expected outcomes, and the reasons for scheduled and emergent follow-up. Questions  were adequately answered. Patient expressed verbal understanding and agreement with the following plan.     Patient Education:  Reviewed with patient the risks (i.e, a repeat concussion, post-concussion syndrome, second-impact syndrome) of returning to play prior to complete resolution, and thoroughly reviewed the signs and symptoms of concussion.Reviewed need for complete resolution of all symptoms, with rest AND exertion, prior to return to  play.  Reviewed red flags for urgent medical evaluation: worsening symptoms, nausea/vomiting, intractable headache, musculoskeletal changes, focal neurological deficits.  [facility] Concussion Care Plan, which clearly outlines the plans stated above, was given to patient.   In addition to the time spent performing tests, I spent 94min on this visit  After Visit Summary printed out and provided to patient as appropriate.  The above documentation has been reviewed and is accurate and complete Lynne Leader

## 2019-11-28 NOTE — Patient Instructions (Addendum)
Thank you for coming in today. Take trazodone at bedtime as needed.  Get plenty of rest.  Recheck in 2 weeks.  At that point if still very dizzy we will plan for balance/vestibular PT.   Make sure your neurologist knows what is going on with me and concussion.    Concussion, Adult  A concussion is a brain injury from a hard, direct hit (trauma) to your head or body. This direct hit causes the brain to quickly shake back and forth inside the skull. A concussion may also be called a mild traumatic brain injury (TBI). Healing from this injury can take time. What are the causes? This condition is caused by:  A direct hit to your head, such as: ? Running into a player during a game. ? Being hit in a fight. ? Hitting your head on a hard surface.  A quick and sudden movement (jolt) of the head or neck, such as in a car crash. What are the signs or symptoms? The signs of a concussion can be hard to notice. They may be missed by you, family members, and doctors. You may look fine on the outside but may not act or feel normal. Physical symptoms  Headaches.  Being tired (fatigued).  Being dizzy.  Problems with body balance.  Problems seeing or hearing.  Being sensitive to light or noise.  Feeling sick to your stomach (nausea) or throwing up (vomiting).  Not sleeping or eating as you used to.  Loss of feeling (numbness) or tingling in the body.  Seizure. Mental and emotional symptoms  Problems remembering things.  Trouble focusing your mind (concentrating), organizing, or making decisions.  Being slow to think, act, react, speak, or read.  Feeling grouchy (irritable).  Having mood changes.  Feeling worried or nervous (anxious).  Feeling sad (depressed). How is this treated? This condition may be treated by:  Stopping sports or activity if you are injured. If you hit your head or have signs of concussion: ? Do not return to sports or activities the same day. ? Get  checked by a doctor before you return to your activities.  Resting your body and your mind.  Being watched carefully, often at home.  Medicines to help with symptoms such as: ? Feeling sick to your stomach. ? Headaches. ? Problems with sleep.  Avoid taking strong pain medicines (opioids) for a concussion.  Avoiding alcohol and drugs.  Being asked to go to a concussion clinic or a place to help you recover (rehabilitation center). Recovery from a concussion can take time. Return to activities only:  When you are fully healed.  When your doctor says it is safe. Follow these instructions at home: Activity  Limit activities that need a lot of thought or focus, such as: ? Homework or work for your job. ? Watching TV. ? Using the computer or phone. ? Playing memory games and puzzles.  Rest. Rest helps your brain heal. Make sure you: ? Get plenty of sleep. Most adults should get 7-9 hours of sleep each night. ? Rest during the day. Take naps or breaks when you feel tired.  Avoid activity like exercise until your doctor says its safe. Stop any activity that makes symptoms worse.  Do not do activities that could cause a second concussion, such as riding a bike or playing sports.  Ask your doctor when you can return to your normal activities, such as school, work, sports, and driving. Your ability to react may be slower. Do  not do these activities if you are dizzy. General instructions   Take over-the-counter and prescription medicines only as told by your doctor.  Do not drink alcohol until your doctor says you can.  Watch your symptoms and tell other people to do the same. Other problems can occur after a concussion. Older adults have a higher risk of serious problems.  Tell your work Production designer, theatre/television/film, teachers, Tax adviser, school counselor, coach, or Event organiser about your injury and symptoms. Tell them about what you can or cannot do.  Keep all follow-up visits as told by  your doctor. This is important. How is this prevented?  It is very important that you do not get another brain injury. In rare cases, another injury can cause brain damage that will not go away, brain swelling, or death. The risk of this is greatest in the first 7-10 days after a head injury. To avoid injuries: ? Stop activities that could lead to a second concussion, such as contact sports, until your doctor says it is okay. ? When you return to sports or activities:  Do not crash into other players. This is how most concussions happen.  Follow the rules.  Respect other players. ? Get regular exercise. Do strength and balance training. ? Wear a helmet that fits you well during sports, biking, or other activities.  Helmets can help protect you from serious skull and brain injuries, but they do not protect you from a concussion. Even when wearing a helmet, you should avoid being hit in the head. Contact a doctor if:  Your symptoms get worse or they do not get better.  You have new symptoms.  You have another injury. Get help right away if:  You have bad headaches or your headaches get worse.  You feel weak or numb in any part of your body.  You are mixed up (confused).  Your balance gets worse.  You keep throwing up.  You feel more sleepy than normal.  Your speech is not clear (is slurred).  You cannot recognize people or places.  You have a seizure.  Others have trouble waking you up.  You have behavior changes.  You have changes in how you see (vision).  You pass out (lose consciousness). Summary  A concussion is a brain injury from a hard, direct hit (trauma) to your head or body.  This condition is treated with rest and careful watching of symptoms.  If you keep having symptoms, call your doctor. This information is not intended to replace advice given to you by your health care provider. Make sure you discuss any questions you have with your health care  provider. Document Revised: 04/12/2018 Document Reviewed: 04/12/2018 Elsevier Patient Education  2020 [employer].

## 2019-12-12 ENCOUNTER — Other Ambulatory Visit: Payer: Self-pay

## 2019-12-12 ENCOUNTER — Ambulatory Visit (INDEPENDENT_AMBULATORY_CARE_PROVIDER_SITE_OTHER): Payer: Medicaid Other | Admitting: Family Medicine

## 2019-12-12 ENCOUNTER — Encounter: Payer: Self-pay | Admitting: Family Medicine

## 2019-12-12 VITALS — BP 120/78 | HR 87 | Ht 62.5 in | Wt 169.0 lb

## 2019-12-12 DIAGNOSIS — Z8616 Personal history of COVID-19: Secondary | ICD-10-CM

## 2019-12-12 DIAGNOSIS — R569 Unspecified convulsions: Secondary | ICD-10-CM

## 2019-12-12 DIAGNOSIS — S060X0D Concussion without loss of consciousness, subsequent encounter: Secondary | ICD-10-CM

## 2019-12-12 DIAGNOSIS — G4459 Other complicated headache syndrome: Secondary | ICD-10-CM | POA: Diagnosis not present

## 2019-12-12 HISTORY — DX: Personal history of COVID-19: Z86.16

## 2019-12-12 MED ORDER — AMITRIPTYLINE HCL 25 MG PO TABS: 25.0000 mg | ORAL_TABLET | Freq: Every evening | ORAL | 1 refills | Status: DC | PRN

## 2019-12-12 MED ORDER — MELOXICAM 15 MG PO TABS: 15.0000 mg | ORAL_TABLET | Freq: Every day | ORAL | 0 refills | Status: DC | PRN

## 2019-12-12 NOTE — Progress Notes (Signed)
Subjective:    Chief Complaint: [name], LAT, ATC, am serving as scribe for Dr. [name].  Olivia Kelly, DOB: Aug 17, 1986, is a 34 y.o. female who presents for f/u of a concussion sustained on 11/20/19 after she had a seizure while at work and struck the back of her head on the floor.  She last saw Dr. [name] on 11/24/19 and c/o HA, dizziness, balance issues and vertigo.  She was prescribed Trazodone 50mg .  Since her last visit, pt reports that she con't to have headaches, sensitivity to light, nausea, difficulty sleeping and memory difficulty.  Her dizziness has improved.   Injury date : 11/20/19 Visit #: 2   History of Present Illness:    Concussion Self-Reported Symptom Score Symptoms rated on a scale 1-6, in last 24 hours   Headache: 6    Nausea: 6  Dizziness: 0  Vomiting: 0  Balance Difficulty: 2   Trouble Falling Asleep: 6   Fatigue: 6  Sleep Less Than Usual: 6  Daytime Drowsiness: 6  Sleep More Than Usual: 3  Photophobia: 6  Phonophobia: 3  Irritability: 5  Sadness: 6  Numbness or Tingling: 0  Nervousness: 6  Feeling More Emotional: 6  Feeling Mentally Foggy: 5  Feeling Slowed Down: 3  Memory Problems: 6  Difficulty Concentrating: 6  Visual Problems: 3   Total # of Symptoms: 19 Total Symptom Score: 98 Previous Total # of Symptoms: 18/22 Previous Symptom Score: 84/132   Neck Pain: No  Tinnitus: No  Review of Systems: Headache feeling slowed poor attention light sensitivity    Review of History: History of seizures and hydronephrosis with partial heminephrectomy  Objective:    Physical Examination Vitals:   12/12/19 0920  BP: 120/78  Pulse: 87  SpO2: 99%   MSK:   C-spine normal-appearing nontender normal cervical motion Neuro: Alert and oriented light sensitivity.  Normal balance and gait. Psych: Alert and oriented affect slightly flattened    Chemistry      Component Value Date/Time   NA 136 11/20/2019 2054   K 3.6 11/20/2019  2054   CL 104 11/20/2019 2054   CO2 22 11/20/2019 2054   BUN 9 11/20/2019 2054   CREATININE 0.70 11/20/2019 2054      Component Value Date/Time   CALCIUM 9.1 11/20/2019 2054   ALKPHOS 49 11/06/2018 1921   AST 16 11/06/2018 1921   ALT 16 11/06/2018 1921   BILITOT 0.4 11/06/2018 1921      CT Head Wo Contrast  Result Date: 11/20/2019 CLINICAL DATA:  Headache history of seizure laceration to back of head EXAM: CT HEAD WITHOUT CONTRAST CT CERVICAL SPINE WITHOUT CONTRAST TECHNIQUE: Multidetector CT imaging of the head and cervical spine was performed following the standard protocol without intravenous contrast. Multiplanar CT image reconstructions of the cervical spine were also generated. COMPARISON:  CT brain 04/29/2006 FINDINGS: CT HEAD FINDINGS Brain: No acute territorial infarction, hemorrhage or intracranial mass. The ventricles are nonenlarged Vascular: No hyperdense vessels.  Carotid vascular calcification. Skull: Normal. Negative for fracture or focal lesion. Sinuses/Orbits: No acute finding. Other: Right posterior parietal scalp hematoma and laceration CT CERVICAL SPINE FINDINGS Alignment: Straightening of the cervical spine. No subluxation. Facet alignment within normal limits. Mild rotation of C1 on C2, probably positional. Skull base and vertebrae: Vertebral body heights are maintained. Incomplete fusion posterior arch of C1. Soft tissues and spinal canal: No prevertebral fluid or swelling. No visible canal hematoma. Disc levels:  Disc spaces are maintained Upper chest:  Negative. Other: None IMPRESSION: 1. Negative non contrasted CT appearance of the brain 2. Straightening of the cervical spine.  No fracture is seen. Electronically Signed   By: Donavan Foil M.D.   On: 11/20/2019 22:48   CT Cervical Spine Wo Contrast  Result Date: 11/20/2019 CLINICAL DATA:  Headache history of seizure laceration to back of head EXAM: CT HEAD WITHOUT CONTRAST CT CERVICAL SPINE WITHOUT CONTRAST TECHNIQUE:  Multidetector CT imaging of the head and cervical spine was performed following the standard protocol without intravenous contrast. Multiplanar CT image reconstructions of the cervical spine were also generated. COMPARISON:  CT brain 04/29/2006 FINDINGS: CT HEAD FINDINGS Brain: No acute territorial infarction, hemorrhage or intracranial mass. The ventricles are nonenlarged Vascular: No hyperdense vessels.  Carotid vascular calcification. Skull: Normal. Negative for fracture or focal lesion. Sinuses/Orbits: No acute finding. Other: Right posterior parietal scalp hematoma and laceration CT CERVICAL SPINE FINDINGS Alignment: Straightening of the cervical spine. No subluxation. Facet alignment within normal limits. Mild rotation of C1 on C2, probably positional. Skull base and vertebrae: Vertebral body heights are maintained. Incomplete fusion posterior arch of C1. Soft tissues and spinal canal: No prevertebral fluid or swelling. No visible canal hematoma. Disc levels:  Disc spaces are maintained Upper chest: Negative. Other: None IMPRESSION: 1. Negative non contrasted CT appearance of the brain 2. Straightening of the cervical spine.  No fracture is seen. Electronically Signed   By: Donavan Foil M.D.   On: 11/20/2019 22:48   I, Lynne Leader, personally (independently) visualized and performed the interpretation of the images attached in this note.   Assessment and Plan   34 year old woman here to follow-up her concussion occurring after a fall during a seizure. Fortunately she is not had any seizure activity since her seizure in March.  She has a follow-up appointment scheduled with her neurologist for April 12.  Main issues today are headache light sensitivity and feeling slowed and poor concentration.  Fortunately dizziness has resolved.  She unfortunately is quite affected by especially headache.  Additionally she notes some difficulty with insomnia that trazodone is not helping much for.  Plan to add  meloxicam for headache.  Tylenol is not sufficient.  Kidney function was normal when checked in the emergency room recently.  Additionally will use amitriptyline at bedtime for insomnia and for headache.  Doses of amitriptyline should be safe with seizure history and Topamax. She is already on Topamax which I would consider be a good headache prevention medication.  I do not think that adjusting doses as necessary on my and however would appreciate neurology help on this.  I will complete leave of absence work note as well.    Recheck back 2 weeks    Action/Discussion: Reviewed diagnosis, management options, expected outcomes, and the reasons for scheduled and emergent follow-up. Questions were adequately answered. Patient expressed verbal understanding and agreement with the following plan.       Total encounter time 30 minutes including charting time date of service. Discussed medications headache plan and next steps  The above documentation has been reviewed and is accurate and complete Lynne Leader

## 2019-12-12 NOTE — Patient Instructions (Addendum)
Thank you for coming in today. Continue tylenol as needed.  Add meloxicam daily for headache as needed.  For sleep lets try something different that Trazodone.  Take amitriptyline at bedtime for sleep and for headache prevention.  Use sunglasses for light sensitivity even inside as needed.   Recheck with me in 2 weeks.  Return sooner or contact me sooner if needed.   Advance activity as tolerated.   Amitriptyline tablets What is this medicine? AMITRIPTYLINE (a mee TRIP ti leen) is used to treat depression. This medicine may be used for other purposes; ask your health care provider or pharmacist if you have questions. COMMON BRAND NAME(S): Elavil, Vanatrip What should I tell my health care provider before I take this medicine? They need to know if you have any of these conditions:  an alcohol problem  asthma, difficulty breathing  bipolar disorder or schizophrenia  difficulty passing urine, prostate trouble  glaucoma  heart disease or previous heart attack  liver disease  over active thyroid  seizures  thoughts or plans of suicide, a previous suicide attempt, or family history of suicide attempt  an unusual or allergic reaction to amitriptyline, other medicines, foods, dyes, or preservatives  pregnant or trying to get pregnant  breast-feeding How should I use this medicine? Take this medicine by mouth with a drink of water. Follow the directions on the prescription label. You can take the tablets with or without food. Take your medicine at regular intervals. Do not take it more often than directed. Do not stop taking this medicine suddenly except upon the advice of your doctor. Stopping this medicine too quickly may cause serious side effects or your condition may worsen. A special MedGuide will be given to you by the pharmacist with each prescription and refill. Be sure to read this information carefully each time. Talk to your pediatrician regarding the use of this  medicine in children. Special care may be needed. Overdosage: If you think you have taken too much of this medicine contact a poison control center or emergency room at once. NOTE: This medicine is only for you. Do not share this medicine with others. What if I miss a dose? If you miss a dose, take it as soon as you can. If it is almost time for your next dose, take only that dose. Do not take double or extra doses. What may interact with this medicine? Do not take this medicine with any of the following medications:  arsenic trioxide  certain medicines used to regulate abnormal heartbeat or to treat other heart conditions  cisapride  droperidol  halofantrine  linezolid  MAOIs like Carbex, Eldepryl, Marplan, Nardil, and Parnate  methylene blue  other medicines for mental depression  phenothiazines like perphenazine, thioridazine and chlorpromazine  pimozide  probucol  procarbazine  sparfloxacin  St. John's Wort This medicine may also interact with the following medications:  atropine and related drugs like hyoscyamine, scopolamine, tolterodine and others  barbiturate medicines for inducing sleep or treating seizures, like phenobarbital  cimetidine  disulfiram  ethchlorvynol  thyroid hormones such as levothyroxine  ziprasidone This list may not describe all possible interactions. Give your health care provider a list of all the medicines, herbs, non-prescription drugs, or dietary supplements you use. Also tell them if you smoke, drink alcohol, or use illegal drugs. Some items may interact with your medicine. What should I watch for while using this medicine? Tell your doctor if your symptoms do not get better or if they get worse.  Visit your doctor or health care professional for regular checks on your progress. Because it may take several weeks to see the full effects of this medicine, it is important to continue your treatment as prescribed by your  doctor. Patients and their families should watch out for new or worsening thoughts of suicide or depression. Also watch out for sudden changes in feelings such as feeling anxious, agitated, panicky, irritable, hostile, aggressive, impulsive, severely restless, overly excited and hyperactive, or not being able to sleep. If this happens, especially at the beginning of treatment or after a change in dose, call your health care professional. Dennis Bast may get drowsy or dizzy. Do not drive, use machinery, or do anything that needs mental alertness until you know how this medicine affects you. Do not stand or sit up quickly, especially if you are an older patient. This reduces the risk of dizzy or fainting spells. Alcohol may interfere with the effect of this medicine. Avoid alcoholic drinks. Do not treat yourself for coughs, colds, or allergies without asking your doctor or health care professional for advice. Some ingredients can increase possible side effects. Your mouth may get dry. Chewing sugarless gum or sucking hard candy, and drinking plenty of water will help. Contact your doctor if the problem does not go away or is severe. This medicine may cause dry eyes and blurred vision. If you wear contact lenses you may feel some discomfort. Lubricating drops may help. See your eye doctor if the problem does not go away or is severe. This medicine can cause constipation. Try to have a bowel movement at least every 2 to 3 days. If you do not have a bowel movement for 3 days, call your doctor or health care professional. This medicine can make you more sensitive to the sun. Keep out of the sun. If you cannot avoid being in the sun, wear protective clothing and use sunscreen. Do not use sun lamps or tanning beds/booths. What side effects may I notice from receiving this medicine? Side effects that you should report to your doctor or health care professional as soon as possible:  allergic reactions like skin rash,  itching or hives, swelling of the face, lips, or tongue  anxious  breathing problems  changes in vision  confusion  elevated mood, decreased need for sleep, racing thoughts, impulsive behavior  eye pain  fast, irregular heartbeat  feeling faint or lightheaded, falls  feeling agitated, angry, or irritable  fever with increased sweating  hallucination, loss of contact with reality  seizures  stiff muscles  suicidal thoughts or other mood changes  tingling, pain, or numbness in the feet or hands  trouble passing urine or change in the amount of urine  trouble sleeping  unusually weak or tired  vomiting  yellowing of the eyes or skin Side effects that usually do not require medical attention (report to your doctor or health care professional if they continue or are bothersome):  change in sex drive or performance  change in appetite or weight  constipation  dizziness  dry mouth  nausea  tired  tremors  upset stomach This list may not describe all possible side effects. Call your doctor for medical advice about side effects. You may report side effects to FDA at 1-800-FDA-1088. Where should I keep my medicine? Keep out of the reach of children. Store at room temperature between 20 and 25 degrees C (68 and 77 degrees F). Throw away any unused medicine after the expiration date. NOTE: This  sheet is a summary. It may not cover all possible information. If you have questions about this medicine, talk to your doctor, pharmacist, or health care provider.  2020 Elsevier/Gold Standard (2018-08-14 13:04:32)

## 2019-12-25 ENCOUNTER — Encounter: Payer: Self-pay | Admitting: Family Medicine

## 2019-12-25 ENCOUNTER — Other Ambulatory Visit: Payer: Self-pay

## 2019-12-25 ENCOUNTER — Ambulatory Visit (INDEPENDENT_AMBULATORY_CARE_PROVIDER_SITE_OTHER): Payer: Medicaid Other | Admitting: Family Medicine

## 2019-12-25 VITALS — BP 120/62 | HR 95 | Ht 62.5 in | Wt 167.8 lb

## 2019-12-25 DIAGNOSIS — F5101 Primary insomnia: Secondary | ICD-10-CM | POA: Diagnosis not present

## 2019-12-25 DIAGNOSIS — S060X0D Concussion without loss of consciousness, subsequent encounter: Secondary | ICD-10-CM

## 2019-12-25 MED ORDER — ZOLPIDEM TARTRATE 5 MG PO TABS: 5.0000 mg | ORAL_TABLET | Freq: Every evening | ORAL | 3 refills | Status: DC | PRN

## 2019-12-25 NOTE — Patient Instructions (Signed)
Thank you for coming in today.  Take Ambien at bedtime on an empty stomach as needed for sleep.  Work also on Physiological [employer]. Recheck with me as needed. Let me know once the MRI is done. I will also take a look at the report.  I am happy to write a letter to return to work if needed.  Let me know where the letter needs to go.    Zolpidem tablets What is this medicine? ZOLPIDEM (zole PI dem) is used to treat insomnia. This medicine helps you to fall asleep and sleep through the night. This medicine may be used for other purposes; ask your health care provider or pharmacist if you have questions. COMMON BRAND NAME(S): Ambien What should I tell my health care provider before I take this medicine? They need to know if you have any of these conditions:  depression  history of drug abuse or addiction  if you often drink alcohol  liver disease  lung or breathing disease  myasthenia gravis  sleep apnea  sleep-walking, driving, eating or other activity while not fully awake after taking a sleep medicine  suicidal thoughts, plans, or attempt; a previous suicide attempt by you or a family member  an unusual or allergic reaction to zolpidem, other medicines, foods, dyes, or preservatives  pregnant or trying to get pregnant  breast-feeding How should I use this medicine? Take this medicine by mouth with a glass of water. Follow the directions on the prescription label. It is better to take this medicine on an empty stomach and only when you are ready for bed. Do not take your medicine more often than directed. If you have been taking this medicine for several weeks and suddenly stop taking it, you may get unpleasant withdrawal symptoms. Your doctor or health care professional may want to gradually reduce the dose. Do not stop taking this medicine on your own. Always follow your doctor or health care professional's advice. A special MedGuide will be given to you by the pharmacist with each  prescription and refill. Be sure to read this information carefully each time. Talk to your pediatrician regarding the use of this medicine in children. Special care may be needed. Overdosage: If you think you have taken too much of this medicine contact a poison control center or emergency room at once. NOTE: This medicine is only for you. Do not share this medicine with others. What if I miss a dose? This does not apply. This medicine should only be taken immediately before going to sleep. Do not take double or extra doses. What may interact with this medicine?  alcohol  antihistamines for allergy, cough and cold  certain medicines for anxiety or sleep  certain medicines for depression, like amitriptyline, fluoxetine, sertraline  certain medicines for fungal infections like ketoconazole and itraconazole  certain medicines for seizures like phenobarbital, primidone  ciprofloxacin  dietary supplements for sleep, like valerian or kava kava  general anesthetics like halothane, isoflurane, methoxyflurane, propofol  local anesthetics like lidocaine, pramoxine, tetracaine  medicines that relax muscles for surgery  narcotic medicines for pain  phenothiazines like chlorpromazine, mesoridazine, prochlorperazine, thioridazine  rifampin This list may not describe all possible interactions. Give your health care provider a list of all the medicines, herbs, non-prescription drugs, or dietary supplements you use. Also tell them if you smoke, drink alcohol, or use illegal drugs. Some items may interact with your medicine. What should I watch for while using this medicine? Visit your doctor or health care  professional for regular checks on your progress. Keep a regular sleep schedule by going to bed at about the same time each night. Avoid caffeine-containing drinks in the evening hours. When sleep medicines are used every night for more than a few weeks, they may stop working. Talk to your  doctor if you still have trouble sleeping. After taking this medicine, you may get up out of bed and do an activity that you do not know you are doing. The next morning, you may have no memory of this. Activities include driving a car ("sleep-driving"), making and eating food, talking on the phone, sexual activity, and sleep-walking. Serious injuries have occurred. Stop the medicine and call your doctor right away if you find out you have done any of these activities. Do not take this medicine if you have used alcohol that evening. Do not take it if you have taken another medicine for sleep. The risk of doing these sleep-related activities is higher. Wait for at least 8 hours after you take a dose before driving or doing other activities that require full mental alertness. Do not take this medicine unless you are able to stay in bed for a full night (7 to 8 hours) before you must be active again. You may have a decrease in mental alertness the day after use, even if you feel that you are fully awake. Tell your doctor if you will need to perform activities requiring full alertness, such as driving, the next day. Do not stand or sit up quickly after taking this medicine, especially if you are an older patient. This reduces the risk of dizzy or fainting spells. If you or your family notice any changes in your behavior, such as new or worsening depression, thoughts of harming yourself, anxiety, other unusual or disturbing thoughts, or memory loss, call your doctor right away. After you stop taking this medicine, you may have trouble falling asleep. This is called rebound insomnia. This problem usually goes away on its own after 1 or 2 nights. What side effects may I notice from receiving this medicine? Side effects that you should report to your doctor or health care professional as soon as possible:  allergic reactions like skin rash, itching or hives, swelling of the face, lips, or tongue  breathing  problems  changes in vision  confusion  depressed mood or other changes in moods or emotions  feeling faint or lightheaded, falls  hallucinations  loss of balance or coordination  loss of memory  numbness or tingling of the tongue  restlessness, excitability, or feelings of anxiety or agitation  signs and symptoms of liver injury like dark yellow or brown urine; general ill feeling or flu-like symptoms; light-colored stools; loss of appetite; nausea; right upper belly pain; unusually weak or tired; yellowing of the eyes or skin  suicidal thoughts  unusual activities while not fully awake like driving, eating, making phone calls, or sexual activity Side effects that usually do not require medical attention (report to your doctor or health care professional if they continue or are bothersome):  dizziness  drowsiness the day after you take this medicine  headache This list may not describe all possible side effects. Call your doctor for medical advice about side effects. You may report side effects to FDA at 1-800-FDA-1088. Where should I keep my medicine? Keep out of the reach of children. This medicine can be abused. Keep your medicine in a safe place to protect it from theft. Do not share this medicine  with anyone. Selling or giving away this medicine is dangerous and against the law. This medicine may cause accidental overdose and death if taken by other adults, children, or pets. Mix any unused medicine with a substance like cat litter or coffee grounds. Then throw the medicine away in a sealed container like a sealed bag or a coffee can with a lid. Do not use the medicine after the expiration date. Store at room temperature between 20 and 25 degrees C (68 and 77 degrees F). NOTE: This sheet is a summary. It may not cover all possible information. If you have questions about this medicine, talk to your doctor, pharmacist, or health care provider.  2020 Elsevier/Gold Standard  (2018-05-11 11:51:08)

## 2019-12-25 NOTE — Progress Notes (Signed)
Subjective:    Chief Complaint: Olivia Kelly, LAT, ATC, am serving as scribe for Dr. [name].  Olivia Kelly, DOB: [birth year], is a 34 y.o. female who presents for concussion f/u after suffering a seizure while at work on 11/20/19 and striking the back of her head on the floor.  She was last seen by Dr. [name] on 12/12/19 and c/o con't HA, photophobia, nausea and difficulty w/ sleeping and memory.  She was prescribed Amitriptyline 25 mg and Meloxicam 15 mg.  Since her last visit, pt reports that she is feeling better.  She notes that she is no longer getting nauseous and is not having any issue w/ dizziness.  She con't to get HA but the frequency has decreased.  She con't to have difficulty w/ sleeping but her memory issues have slightly improved.   Injury date : 11/20/19 Visit #: 3   History of Present Illness:    Concussion Self-Reported Symptom Score Symptoms rated on a scale 1-6, in last 24 hours   Headache: 3    Nausea: 0  Dizziness: 0  Vomiting: 0  Balance Difficulty: 0   Trouble Falling Asleep: 6   Fatigue: 6  Sleep Less Than Usual: 6  Daytime Drowsiness: 5  Sleep More Than Usual: 0  Photophobia: 3  Phonophobia: 3  Irritability: 2  Sadness: 0  Numbness or Tingling: 2 in R arm  Nervousness: 0  Feeling More Emotional: 0  Feeling Mentally Foggy: 3  Feeling Slowed Down: 3  Memory Problems: 3  Difficulty Concentrating: 3  Visual Problems: 0   Total # of Symptoms:13 Total Symptom Score: 48/132 Previous Total # of Symptoms: 19/22 Previous Symptom Score: 98/132   Neck Pain: No  Tinnitus: No  Review of Systems:  No fever or chills     Objective:    Physical Examination Vitals:   12/25/19 0937  BP: 120/62  Pulse: 95  SpO2: 100%   MSK: Normal cervical motion Neuro: Alert and oriented normal balance and gait Psych: Normal speech thought process and affect.     Assessment and Plan   34 year old woman with concussion.  Concussion is significantly  improving.  At this point her main remaining issue is insomnia which may be a pre-existing issue worsened by concussion. The concussion is significantly improved.  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  Happy to write note explicitly stating return to work as needed.  Of note neurologist ordered MRI of her brain which is due to be done in early May.  She will send me a message when it has been completed so that I can review the MRI.  This will also be helpful for determining return to work status.  Insomnia: At this point the most persistent problem.  She is tried trazodone which did not help and amitriptyline which did not help and made her feel sleepy the next day.  Discussed options.  Ambien reasonable at this point.  We will start low-dose Ambien and patient report back how she feels.  Recheck back with me as needed.    Action/Discussion: Reviewed diagnosis, management options, expected outcomes, and the reasons for scheduled and emergent follow-up. Questions were adequately answered. Patient expressed verbal understanding and agreement with the following plan.     Patient Education:  Reviewed with patient the risks (i.e, a repeat concussion, post-concussion syndrome, second-impact syndrome) of returning to  play prior to complete resolution, and thoroughly reviewed the signs and symptoms of concussion.Reviewed need for complete resolution of all symptoms, with rest AND exertion, prior to return to play.  Reviewed red flags for urgent medical evaluation: worsening symptoms, nausea/vomiting, intractable headache, musculoskeletal changes, focal neurological deficits.  [facility] Concussion Care Plan, which clearly outlines the plans stated above, was given to patient.   In addition to the time spent performing tests, I spent 30 min   Reviewed with patient the risks (i.e, a repeat  concussion, post-concussion syndrome, second-impact syndrome) of returning to play prior to complete resolution, and thoroughly reviewed the signs and symptoms of      concussion. Reviewedf need for complete resolution of all symptoms, with rest AND exertion, prior to return to play.  Reviewed red flags for urgent medical evaluation: worsening symptoms, nausea/vomiting, intractable headache, musculoskeletal changes, focal neurological deficits.  [facility] Concussion Care Plan, which clearly outlines the plans stated above, was given to patient   After Visit Summary printed out and provided to patient as appropriate.  The above documentation has been reviewed and is accurate and complete Lynne Leader

## 2020-01-14 ENCOUNTER — Encounter: Payer: Self-pay | Admitting: Family Medicine

## 2020-04-16 ENCOUNTER — Encounter: Payer: Self-pay | Admitting: Family Medicine

## 2020-04-17 ENCOUNTER — Encounter (HOSPITAL_COMMUNITY): Payer: Self-pay

## 2020-04-17 ENCOUNTER — Emergency Department (HOSPITAL_COMMUNITY): Payer: Medicaid Other

## 2020-04-17 ENCOUNTER — Emergency Department (HOSPITAL_COMMUNITY)
Admission: EM | Admit: 2020-04-17 | Discharge: 2020-04-18 | Discharge status: Against Medical Advice | Payer: Medicaid Other | Attending: Emergency Medicine | Admitting: Emergency Medicine

## 2020-04-17 DIAGNOSIS — M25512 Pain in left shoulder: Secondary | ICD-10-CM | POA: Insufficient documentation

## 2020-04-17 DIAGNOSIS — Z79899 Other long term (current) drug therapy: Secondary | ICD-10-CM | POA: Diagnosis not present

## 2020-04-17 DIAGNOSIS — R569 Unspecified convulsions: Secondary | ICD-10-CM | POA: Insufficient documentation

## 2020-04-17 DIAGNOSIS — M79603 Pain in arm, unspecified: Secondary | ICD-10-CM | POA: Diagnosis not present

## 2020-04-17 NOTE — ED Triage Notes (Signed)
Pt arrives POV for eval of seizure episode today while at work. 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 Compliant w/ Topamax. States last seizure in march. EMS reports poss L shoulder dislocation.

## 2020-04-18 ENCOUNTER — Encounter (HOSPITAL_COMMUNITY): Payer: Self-pay | Admitting: Emergency Medicine

## 2020-04-18 ENCOUNTER — Other Ambulatory Visit: Payer: Self-pay

## 2020-04-18 ENCOUNTER — Emergency Department (HOSPITAL_COMMUNITY)
Admission: EM | Admit: 2020-04-18 | Discharge: 2020-04-18 | Disposition: A | Discharge status: Home / Self Care | Payer: Medicaid Other | Source: Home / Self Care | Attending: Emergency Medicine | Admitting: Emergency Medicine

## 2020-04-18 DIAGNOSIS — M25512 Pain in left shoulder: Secondary | ICD-10-CM | POA: Insufficient documentation

## 2020-04-18 LAB — CBG MONITORING, ED: Glucose-Capillary: 83 mg/dL (ref 70–99)

## 2020-04-18 MED ORDER — LIDOCAINE 5 % EX PTCH: 1.0000 | MEDICATED_PATCH | CUTANEOUS | 0 refills | Status: DC

## 2020-04-18 MED ORDER — CYCLOBENZAPRINE HCL 10 MG PO TABS: 10.0000 mg | ORAL_TABLET | Freq: Two times a day (BID) | ORAL | 0 refills | Status: DC | PRN

## 2020-04-18 MED ORDER — LIDOCAINE 5 % EX PTCH
1.0000 | MEDICATED_PATCH | CUTANEOUS | Status: DC
  Administered 2020-04-18: 1 via TRANSDERMAL
  Filled 2020-04-18: qty 1

## 2020-04-18 NOTE — ED Notes (Signed)
Patient verbalizes understanding of discharge instructions. Opportunity for questioning and answers were provided. Armband removed by staff, pt discharged from ED and ambulated to lobby to return home.   

## 2020-04-18 NOTE — ED Provider Notes (Signed)
[facility] EMERGENCY DEPARTMENT Provider Note   CSN: [id number] Arrival date & time: 04/18/20  1205     History Chief Complaint  Patient presents with  . Shoulder Pain  . Arm Pain    Olivia Kelly is a 34 y.o. female.  34 year old female with history of seizures presents with left shoulder pain. Patient states she was at work yesterday and felt like she was going to have a seizure, patient was sitting in a chair and the next thing she remembers is waking up as EMS arrived and moved her to the stretcher. Patient was told it was a "typical/full body" seizure, unsure if she fell out of the chair, has had shoulder pain since the seizure. Patient was brought to the ER and had an x-ray of her shoulder but left without being seen due to the extensive wait time. Patient has not had any seizures, falls, injuries, or other events since her x-ray in the ER. Pain is worse with any movement of the shoulder, No other complaints or concerns.         Past Medical History:  Diagnosis Date  . Anemia due to blood loss, acute 04/03/2016  . History of COVID-19 12/12/2019  . Seizures (HCC)    last seizure 2015, on meds    Patient Active Problem List   Diagnosis Date Noted  . History of COVID-19 12/12/2019  . 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 Seizures (HCC) 04/04/2013    Past Surgical History:  Procedure Laterality Date  . cyst removed middle of chest at 52-8 years old    . CYSTOSCOPY WITH RETROGRADE PYELOGRAM, URETEROSCOPY AND STENT PLACEMENT Left 10/29/2014   Procedure: CYSTOSCOPY WITH LEFT RETROGRADE PYELOGRAM,  DIAGNOSTIC LEFT  URETEROSCOPY AND STENT PLACEMENT;  Surgeon: Sebastian Ache, MD;  Location: [facility];  Service: Urology;  Laterality: Left;  . CYSTOSCOPY WITH RETROGRADE PYELOGRAM, URETEROSCOPY AND STENT PLACEMENT Left 01/23/2015   Procedure: CYSTOSCOPY WITH LEFT RETROGRADE  PYELOGRAM/URETERAL STENT PLACEMENT;  Surgeon: Sebastian Ache, MD;  Location: [facility];  Service: Urology;  Laterality: Left;  . IUD REMOVAL N/A 06/09/2015   Procedure: INTRAUTERINE DEVICE (IUD) REMOVAL;  Surgeon: Allie Bossier, MD;  Location: [facility];  Service: Gynecology;  Laterality: N/A;  . NEPHRECTOMY     half of left kidney removed  . ROBOT ASSISTED PYELOPLASTY Left 01/23/2015   Procedure: ROBOTIC ASSISTED PYELOPLASTY WITH STENT PLACEMENT;  Surgeon: Sebastian Ache, MD;  Location: [facility];  Service: Urology;  Laterality: Left;     OB History    Gravida  2   Para  2   Term  2   Preterm  0   AB  0   Living  2     SAB  0   TAB  0   Ectopic  0   Multiple  0   Live Births  2           Family History  Problem Relation Age of Onset  . Diabetes Mother   . Hypertension Mother   . Diabetes Father   . Kidney failure Father   . Cancer Other     Social History   Tobacco Use  . Smoking status: Never Smoker  . Smokeless tobacco: Never Used  Vaping Use  . Vaping Use: Never used  Substance Use Topics  . Alcohol use: Yes    Comment: social  . Drug use: No  Home Medications Prior to Admission medications   Medication Sig Start Date End Date Taking? Authorizing Provider  cyclobenzaprine (FLEXERIL) 10 MG tablet Take 1 tablet (10 mg total) by mouth 2 (two) times daily as needed for muscle spasms. 04/18/20   Jeannie Fend, PA-C  lidocaine (LIDODERM) 5 % Place 1 patch onto the skin daily. Remove & Discard patch within 12 hours or as directed by MD 04/18/20   Jeannie Fend, PA-C  meloxicam (MOBIC) 15 MG tablet Take 1 tablet (15 mg total) by mouth daily as needed (headache). 12/12/19   Rodolph Bong, MD  topiramate (TOPAMAX) 100 MG tablet Take 150 mg by mouth 2 (two) times daily.    [provider]  zolpidem (AMBIEN) 5 MG tablet Take 1 tablet (5 mg total) by mouth at bedtime as needed for sleep. 12/25/19   Rodolph Bong, MD    Allergies    Patient has no known  allergies.  Review of Systems   Review of Systems  Constitutional: Negative for fever.  Musculoskeletal: Positive for arthralgias and myalgias. Negative for back pain, gait problem, joint swelling and neck pain.  Skin: Negative for color change, rash and wound.  Allergic/Immunologic: Negative for immunocompromised state.  Neurological: Negative for weakness and numbness.    Physical Exam Updated Vital Signs BP 110/73 (BP Location: Right Arm)   Pulse 75   Temp 98.4 F (36.9 C) (Oral)   Resp 15   LMP 04/09/2020   SpO2 100%   Physical Exam Vitals and nursing note reviewed.  Constitutional:      General: She is not in acute distress.    Appearance: She is well-developed. She is not diaphoretic.  HENT:     Head: Normocephalic and atraumatic.  Cardiovascular:     Pulses: Normal pulses.  Pulmonary:     Effort: Pulmonary effort is normal.  Musculoskeletal:        General: Tenderness present. No swelling or deformity.       Arms:     Cervical back: Normal, normal range of motion and neck supple. No tenderness or bony tenderness. No pain with movement.     Thoracic back: No tenderness or bony tenderness.     Comments: Tenderness to the left shoulder with extension of the elbow. No bony tenderness or effusion in the elbow. Normal radial pulse, sensation intact. Limited ROM left shoulder secondary to pain, no step off/deformity.   Skin:    General: Skin is warm and dry.     Findings: No erythema or rash.  Neurological:     Mental Status: She is alert and oriented to person, place, and time.     Sensory: No sensory deficit.  Psychiatric:        Behavior: Behavior normal.     ED Results / Procedures / Treatments   Labs (all labs ordered are listed, but only abnormal results are displayed) Labs Reviewed  CBG MONITORING, ED    EKG None  Radiology DG Shoulder Left  Result Date: 04/17/2020 CLINICAL DATA:  Dislocation seizure EXAM: LEFT SHOULDER - 2+ VIEW COMPARISON:   None. FINDINGS: There is no evidence of fracture or dislocation. There is no evidence of arthropathy or other focal bone abnormality. Soft tissues are unremarkable. IMPRESSION: Negative. Electronically Signed   By: Jonna Clark M.D.   On: 04/17/2020 21:28    Procedures Procedures (including critical care time)  Medications Ordered in ED Medications  lidocaine (LIDODERM) 5 % 1 patch (1 patch Transdermal Patch Applied  04/18/20 1747)    ED Course  I have reviewed the triage vital signs and the nursing notes.  Pertinent labs & imaging results that were available during my care of the patient were reviewed by me and considered in my medical decision making (see chart for details).  Clinical Course as of Apr 18 1750  Sat Apr 18, 2020  [id number] 34 year old female returns to the ER for left shoulder pain after seizure yesterday.  Patient has never had a dislocation of her shoulder before but is concerned that her shoulder may be dislocated which is causing limited movement of the shoulder.  On exam, there is no step-off or deformity, there is limited range of motion of left shoulder secondary to pain also will not extend her elbow secondary to shoulder pain.  There is no appreciable swelling, no crepitus.  Sensation and pulses present intact.  Reviewed x-ray from yesterday, no dislocation or fracture.  Patient will be given a Lidoderm patch in the ED, discharged with Lidoderm patch and Flexeril prescription and advised to take ibuprofen and follow-up with sports medicine if not improving.  Also recommend warm compresses and gentle range of motion exercises.  Also advised patient she is not to drive due to recent seizure.   [LM]    Clinical Course User Index [LM] Alden Hipp   MDM Rules/Calculators/A&P                          Final Clinical Impression(s) / ED Diagnoses Final diagnoses:  Acute pain of left shoulder    Rx / DC Orders ED Discharge Orders         Ordered    lidocaine  (LIDODERM) 5 %  Every 24 hours     Discontinue  Reprint     04/18/20 1738    cyclobenzaprine (FLEXERIL) 10 MG tablet  2 times daily PRN     Discontinue  Reprint     04/18/20 1738           Jeannie Fend, PA-C 04/18/20 1751    Alvira Monday, MD 04/20/20 2303

## 2020-04-18 NOTE — Discharge Instructions (Addendum)
Apply Lidoderm patch as needed as prescribed. Take Flexeril as needed as prescribed for muscle tightness. Take ibuprofen as needed as directed for pain. Apply warm compresses to sore muscles for 20 days at a time followed by gentle movement.  Follow-up with sports medicine if pain is not improving. Do not drive due to recent seizure, follow-up with your neurologist.

## 2020-04-18 NOTE — ED Triage Notes (Signed)
Pt states she had a seizure yesterday and came to ED via EMS.  States EMS told her she may have dislocated her L shoulder during seizure.  Pt reports continued L shoulder pain and states she had x-rays in ED yesterday but LWBS due to wait.

## 2020-04-24 ENCOUNTER — Ambulatory Visit: Payer: Medicaid Other | Admitting: Family Medicine

## 2020-08-23 ENCOUNTER — Other Ambulatory Visit: Payer: Self-pay | Admitting: Family Medicine

## 2020-08-24 NOTE — Telephone Encounter (Signed)
Please advise 

## 2020-08-26 ENCOUNTER — Emergency Department (HOSPITAL_COMMUNITY)
Admission: EM | Admit: 2020-08-26 | Discharge: 2020-08-27 | Disposition: A | Discharge status: Home / Self Care | Payer: Medicaid Other | Attending: Emergency Medicine | Admitting: Emergency Medicine

## 2020-08-26 ENCOUNTER — Other Ambulatory Visit: Payer: Self-pay

## 2020-08-26 ENCOUNTER — Encounter (HOSPITAL_COMMUNITY): Payer: Self-pay

## 2020-08-26 DIAGNOSIS — Z79899 Other long term (current) drug therapy: Secondary | ICD-10-CM | POA: Insufficient documentation

## 2020-08-26 DIAGNOSIS — R42 Dizziness and giddiness: Secondary | ICD-10-CM | POA: Diagnosis present

## 2020-08-26 DIAGNOSIS — F419 Anxiety disorder, unspecified: Secondary | ICD-10-CM

## 2020-08-26 DIAGNOSIS — F410 Panic disorder [episodic paroxysmal anxiety] without agoraphobia: Secondary | ICD-10-CM | POA: Diagnosis not present

## 2020-08-26 NOTE — ED Triage Notes (Signed)
Pt reports a seizure last Saturday and has been dizzy and more anxious than normal. Pt reports taking Topiramate 50mg  for seizures.

## 2020-08-26 NOTE — ED Provider Notes (Signed)
TIME SEEN: 11:36 PM  CHIEF COMPLAINT: Anxiety, dizziness  HPI: Patient is a 34 year old female with history of seizures on Topamax who presents to the emergency department with anxiety and lightheadedness.  States that she has been very anxious since having a seizure on Saturday.  Reports compliance with her Topamax.  She denies fevers, chills, nausea, vomiting, diarrhea, bloody stools, melena.  No chest pain or shortness of breath.  No SI or HI but states she has had uncontrollable crying.  She does not see an outpatient psychiatrist or counselor.  ROS: See HPI Constitutional: no fever  Eyes: no drainage  ENT: no runny nose   Cardiovascular:  no chest pain  Resp: no SOB  GI: no vomiting GU: no dysuria Integumentary: no rash  Allergy: no hives  Musculoskeletal: no leg swelling  Neurological: no slurred speech ROS otherwise negative  PAST MEDICAL HISTORY/PAST SURGICAL HISTORY:  Past Medical History:  Diagnosis Date  . Anemia due to blood loss, acute 04/03/2016  . History of COVID-19 12/12/2019  . Seizures (HCC)    last seizure 2015, on meds    MEDICATIONS:  Prior to Admission medications   Medication Sig Start Date End Date Taking? Authorizing Provider  amitriptyline (ELAVIL) 25 MG tablet TAKE 1 TABLET BY MOUTH AT BEDTIME AS NEEDED FOR SLEEP ALSO  FOR  HEADACHE  PREVENTION 08/24/20   Rodolph Bong, MD  cyclobenzaprine (FLEXERIL) 10 MG tablet Take 1 tablet (10 mg total) by mouth 2 (two) times daily as needed for muscle spasms. 04/18/20   Jeannie Fend, PA-C  lidocaine (LIDODERM) 5 % Place 1 patch onto the skin daily. Remove & Discard patch within 12 hours or as directed by MD 04/18/20   Jeannie Fend, PA-C  meloxicam (MOBIC) 15 MG tablet Take 1 tablet (15 mg total) by mouth daily as needed (headache). 12/12/19   Rodolph Bong, MD  topiramate (TOPAMAX) 100 MG tablet Take 150 mg by mouth 2 (two) times daily.    [provider]  zolpidem (AMBIEN) 5 MG tablet Take 1 tablet (5 mg  total) by mouth at bedtime as needed for sleep. 12/25/19   Rodolph Bong, MD    ALLERGIES:  No Known Allergies  SOCIAL HISTORY:  Social History   Tobacco Use  . Smoking status: Never Smoker  . Smokeless tobacco: Never Used  Substance Use Topics  . Alcohol use: Yes    Comment: social    FAMILY HISTORY: Family History  Problem Relation Age of Onset  . Diabetes Mother   . Hypertension Mother   . Diabetes Father   . Kidney failure Father   . Cancer Other     EXAM: BP (!) 125/91 (BP Location: Left Arm)   Pulse 77   Temp 98.3 F (36.8 C) (Oral)   Resp 16   Ht 5\' 2"  (1.575 m)   Wt 75.8 kg   SpO2 99%   BMI 30.54 kg/m  CONSTITUTIONAL: Alert and oriented and responds appropriately to questions. Well-appearing; well-nourished HEAD: Normocephalic EYES: Conjunctivae clear, pupils appear equal, EOM appear intact ENT: normal nose; moist mucous membranes NECK: Supple, normal ROM CARD: RRR; S1 and S2 appreciated; no murmurs, no clicks, no rubs, no gallops RESP: Normal chest excursion without splinting or tachypnea; breath sounds clear and equal bilaterally; no wheezes, no rhonchi, no rales, no hypoxia or respiratory distress, speaking full sentences ABD/GI: Normal bowel sounds; non-distended; soft, non-tender, no rebound, no guarding, no peritoneal signs, no hepatosplenomegaly BACK:  The back appears  normal EXT: Normal ROM in all joints; no deformity noted, no edema; no cyanosis SKIN: Normal color for age and race; warm; no rash on exposed skin NEURO: Moves all extremities equally, normal sensation diffusely, cranial nerves II 12 intact, normal speech PSYCH: Anxious.  No SI or HI.  No hallucinations.  Intermittently tearful.  MEDICAL DECISION MAKING: Patient here with anxiety, panic attacks.  She also reports feeling lightheaded intermittently without other symptoms.  Neurologically intact here.  Will obtain basic labs to ensure no electrolyte derangement, anemia, pregnancy that may  be contributing to her dizziness.  Will give IV fluids.  Will obtain orthostatic vital signs.  EKG shows no arrhythmia, interval abnormality or ischemia.  Will give Ativan for her anxiety.  I do not feel at this time that she needs emergent psychiatric evaluation.  ED PROGRESS: Patient reports feeling better after Ativan and IV fluids.  Orthostats normal.  Electrolytes normal.  Hemoglobin normal.  Pregnancy test negative.  Will discharge with brief prescription of Ativan to take as needed at home and given outpatient resources to set up with counselor/psychiatrist.  She has a PCP for close follow-up as well.  We did discuss at length return precautions.  Patient and family comfortable with plan.  At this time, I do not feel there is any life-threatening condition present. I have reviewed, interpreted and discussed all results (EKG, imaging, lab, urine as appropriate) and exam findings with patient/family. I have reviewed nursing notes and appropriate previous records.  I feel the patient is safe to be discharged home without further emergent workup and can continue workup as an outpatient as needed. Discussed usual and customary return precautions. Patient/family verbalize understanding and are comfortable with this plan.  Outpatient follow-up has been provided as needed. All questions have been answered.     EKG Interpretation  Date/Time:  Wednesday August 26 2020 20:44:19 EST Ventricular Rate:  75 PR Interval:    QRS Duration: 81 QT Interval:  374 QTC Calculation: 418 R Axis:   13 Text Interpretation: Sinus rhythm Probable left atrial enlargement Low voltage, precordial leads Baseline wander in lead(s) II III aVF 12 Lead; Mason-Likar No significant change since last tracing Confirmed by Rochele Raring [phone]) on 08/26/2020 11:36:56 PM          Olivia Kelly was evaluated in Emergency Department on 08/26/2020 for the symptoms described in the history of present illness. She was  evaluated in the context of the global COVID-19 pandemic, which necessitated consideration that the patient might be at risk for infection with the SARS-CoV-2 virus that causes COVID-19. Institutional protocols and algorithms that pertain to the evaluation of patients at risk for COVID-19 are in a state of rapid change based on information released by regulatory bodies including the CDC and federal and state organizations. These policies and algorithms were followed during the patient's care in the ED.      Kotaro Buer, Layla Maw, DO 08/27/20 [phone]

## 2020-08-27 LAB — CBC WITH DIFFERENTIAL/PLATELET
Abs Immature Granulocytes: 0.01 10*3/uL (ref 0.00–0.07)
Basophils Absolute: 0 10*3/uL (ref 0.0–0.1)
Basophils Relative: 1 %
Eosinophils Absolute: 0 10*3/uL (ref 0.0–0.5)
Eosinophils Relative: 1 %
HCT: 39.2 % (ref 36.0–46.0)
Hemoglobin: 12.9 g/dL (ref 12.0–15.0)
Immature Granulocytes: 0 %
Lymphocytes Relative: 44 %
Lymphs Abs: 1.8 10*3/uL (ref 0.7–4.0)
MCH: 28.8 pg (ref 26.0–34.0)
MCHC: 32.9 g/dL (ref 30.0–36.0)
MCV: 87.5 fL (ref 80.0–100.0)
Monocytes Absolute: 0.3 10*3/uL (ref 0.1–1.0)
Monocytes Relative: 8 %
Neutro Abs: 1.9 10*3/uL (ref 1.7–7.7)
Neutrophils Relative %: 46 %
Platelets: 345 10*3/uL (ref 150–400)
RBC: 4.48 MIL/uL (ref 3.87–5.11)
RDW: 12.1 % (ref 11.5–15.5)
WBC: 4.1 10*3/uL (ref 4.0–10.5)
nRBC: 0 % (ref 0.0–0.2)

## 2020-08-27 LAB — BASIC METABOLIC PANEL
Anion gap: 8 (ref 5–15)
BUN: 9 mg/dL (ref 6–20)
CO2: 21 mmol/L — ABNORMAL LOW (ref 22–32)
Calcium: 9.1 mg/dL (ref 8.9–10.3)
Chloride: 109 mmol/L (ref 98–111)
Creatinine, Ser: 0.79 mg/dL (ref 0.44–1.00)
GFR, Estimated: >60 mL/min (ref >60)
Glucose, Bld: 94 mg/dL (ref 70–99)
Potassium: 3.4 mmol/L — ABNORMAL LOW (ref 3.5–5.1)
Sodium: 138 mmol/L (ref 135–145)

## 2020-08-27 LAB — I-STAT BETA HCG BLOOD, ED (MC, WL, AP ONLY): I-stat hCG, quantitative: <5 m[IU]/mL (ref <5)

## 2020-08-27 MED ORDER — LORAZEPAM 2 MG/ML IJ SOLN
1.0000 mg | Freq: Once | INTRAMUSCULAR | Status: AC
  Administered 2020-08-27: 1 mg via INTRAVENOUS
  Filled 2020-08-27: qty 1

## 2020-08-27 MED ORDER — SODIUM CHLORIDE 0.9 % IV BOLUS (SEPSIS)
1000.0000 mL | Freq: Once | INTRAVENOUS | Status: AC
  Administered 2020-08-27: 1000 mL via INTRAVENOUS

## 2020-08-27 MED ORDER — LORAZEPAM 1 MG PO TABS: 1.0000 mg | ORAL_TABLET | Freq: Three times a day (TID) | ORAL | 0 refills | Status: DC | PRN

## 2020-08-27 NOTE — Discharge Instructions (Addendum)
Your labs today were reassuring.  Please follow up closely with your PCP.  If your anxiety continues to worsen, I recommend close follow-up with an outpatient counselor and/or psychiatrist.  If you ever develop feelings of wanting to harm yourself or anyone else, hallucinations, please return to the emergency department.  Recommend rest and increase water intake over the next several days to help with your dizziness.

## 2020-08-31 NOTE — Progress Notes (Signed)
I, Philbert Riser, LAT, ATC acting as a scribe for [name], MD.  Olivia Kelly is a 34 y.o. female who presents to [facility] today for f/u concussion.Pt was last seen by Dr. [name] on 12/25/19 and dc documentation was noted on 04/16/20. Of note, pt has a hx of seizures and anxiety, of which she was seen in the ED on 08/26/20. Today, pt reports that HA have continued since April visit. Pt c/o dizziness, hotness, which then brings on anxiety because she feel like she is going to pass out. Pt also c/o photophobia esp from looking at her computer screen.   Unk Lightning main issue is that of last few months has been having nearly daily episodes where she feels anxious flushed and feels her heart rate increase and feel as though she may vomit or pass out.  This happens at least once a day.  She had some evaluation for this already.  She had several normal EEGs in the office with her neurologist.  Additionally she had evaluation in the emergency room with normal electrolytes and EKG.  She has an upcoming video EEG with neurology in February.  Dx imaging: 04/17/20 L shoulder XR          11/20/19 C-spine CT          11/20/19 Head CT   Pertinent review of systems: No fevers or chills  Relevant historical information: History of seizure disorder.   Exam:  BP 118/80 (BP Location: Right Arm, Patient Position: Sitting, Cuff Size: Normal)   Pulse 88   Ht 5\' 2"  (1.575 m)   Wt 172 lb 3.2 oz (78.1 kg)   SpO2 99%   BMI 31.50 kg/m  General: Well Developed, well nourished, and in no acute distress.   MSK: Normal coordination balance and gait. Psych alert and oriented normal speech thought process and affect.  No SI or HI.  Patient expresses anxiety symptoms.    Lab and Radiology Results   Chemistry      Component Value Date/Time   NA 138 08/27/2020 0028   K 3.4 (L) 08/27/2020 0028   CL 109 08/27/2020 0028   CO2 21 (L) 08/27/2020 0028   BUN 9 08/27/2020 0028    CREATININE 0.79 08/27/2020 0028      Component Value Date/Time   CALCIUM 9.1 08/27/2020 0028   ALKPHOS 49 11/06/2018 1921   AST 16 11/06/2018 1921   ALT 16 11/06/2018 1921   BILITOT 0.4 11/06/2018 1921         Assessment and Plan: 34 y.o. female with near syncope with tachycardia.  Very likely to be panic attacks.  However patient has not had enough work-up to rule other causes such as tachyarrhythmia, hypoglycemia etc.  Plan for Holter monitor.  Also patient will start checking her blood pressure and heart rate more regularly.  She has access to glucometer instructions are checking blood sugars especially when feeling bad to look for hypoglycemia.  We will treat for panic with starting low-dose Klonopin for prophylaxis.  Also will start Prozac at 10 mg titrated to 20 mg in 1 week.  Recheck back in 3 weeks.   PDMP not reviewed this encounter. Orders Placed This Encounter  Procedures  . HOLTER MONITOR - 72 HOUR    Standing Status:   Future    Standing Expiration Date:   09/01/2021    Order Specific Question:   Where should this test be performed?    Answer:  [facility]    Order Specific Question:   Release to patient    Answer:   Immediate   Meds ordered this encounter  Medications  . FLUoxetine (PROZAC) 10 MG capsule    Sig: Take 1 capsule (10 mg total) by mouth daily for 7 days, THEN 2 capsules (20 mg total) daily for 21 days.    Dispense:  49 capsule    Refill:  1  . clonazePAM (KLONOPIN) 0.5 MG tablet    Sig: Take 1 tablet (0.5 mg total) by mouth 2 (two) times daily.    Dispense:  60 tablet    Refill:  1     Discussed warning signs or symptoms. Please see discharge instructions. Patient expresses understanding.   The above documentation has been reviewed and is accurate and complete [name], M.D.

## 2020-09-01 ENCOUNTER — Other Ambulatory Visit: Payer: Self-pay | Admitting: *Deleted

## 2020-09-01 ENCOUNTER — Ambulatory Visit (INDEPENDENT_AMBULATORY_CARE_PROVIDER_SITE_OTHER): Payer: Medicaid Other

## 2020-09-01 ENCOUNTER — Telehealth: Payer: Self-pay | Admitting: Family Medicine

## 2020-09-01 ENCOUNTER — Ambulatory Visit (INDEPENDENT_AMBULATORY_CARE_PROVIDER_SITE_OTHER): Payer: Medicaid Other | Admitting: Family Medicine

## 2020-09-01 ENCOUNTER — Other Ambulatory Visit: Payer: Self-pay

## 2020-09-01 VITALS — BP 118/80 | HR 88 | Ht 62.0 in | Wt 172.2 lb

## 2020-09-01 DIAGNOSIS — F419 Anxiety disorder, unspecified: Secondary | ICD-10-CM

## 2020-09-01 DIAGNOSIS — R55 Syncope and collapse: Secondary | ICD-10-CM

## 2020-09-01 DIAGNOSIS — R000 Tachycardia, unspecified: Secondary | ICD-10-CM

## 2020-09-01 DIAGNOSIS — F411 Generalized anxiety disorder: Secondary | ICD-10-CM | POA: Diagnosis not present

## 2020-09-01 DIAGNOSIS — R569 Unspecified convulsions: Secondary | ICD-10-CM | POA: Diagnosis not present

## 2020-09-01 MED ORDER — CLONAZEPAM 0.5 MG PO TABS: 0.5000 mg | ORAL_TABLET | Freq: Two times a day (BID) | ORAL | 1 refills | Status: DC

## 2020-09-01 MED ORDER — FLUOXETINE HCL 10 MG PO CAPS: ORAL_CAPSULE | ORAL | 1 refills | Status: DC

## 2020-09-01 NOTE — Patient Instructions (Signed)
Thank you for coming in today.  I think this is panic but we are going to prove it is nothing worse.   You should from cardiology soon about Holter Monitor.  Let me know if you do not.   Measure your blood sugar during an episode a few times.   Send me measurements of blood pressure and HR a few times a day.   Start klonopin twice daily. This will help with anxiety now.   Start prozac daily and increase to 2 pill daily in 1 week. This will help with anxiety in 3 weeks.   Recheck with me in 3 weeks.   Let me know if this is not working.   Panic Attack A panic attack is a sudden episode of severe anxiety, fear, or discomfort that causes physical and emotional symptoms. The attack may be in response to something frightening, or it may occur for no known reason. Symptoms of a panic attack can be similar to symptoms of a heart attack or stroke. It is important to see your health care provider when you have a panic attack so that these conditions can be ruled out. A panic attack is a symptom of another condition. Most panic attacks go away with treatment of the underlying problem. If you have panic attacks often, you may have a condition called panic disorder. What are the causes? A panic attack may be caused by:  An extreme, life-threatening situation, such as a war or natural disaster.  An anxiety disorder, such as post-traumatic stress disorder.  Depression.  Certain medical conditions, including heart problems, neurological conditions, and infections.  Certain over-the-counter and prescription medicines.  Illegal drugs that increase heart rate and blood pressure, such as methamphetamine.  Alcohol.  Supplements that increase anxiety.  Panic disorder. What increases the risk? You are more likely to develop this condition if:  You have an anxiety disorder.  You have another mental health condition.  You take certain medicines.  You use alcohol, illegal drugs, or other  substances.  You are under extreme stress.  A life event is causing increased feelings of anxiety and depression. What are the signs or symptoms? A panic attack starts suddenly, usually lasts about 20 minutes, and occurs with one or more of the following:  A pounding heart.  A feeling that your heart is beating irregularly or faster than normal (palpitations).  Sweating.  Trembling or shaking.  Shortness of breath or feeling smothered.  Feeling choked.  Chest pain or discomfort.  Nausea or a strange feeling in your stomach.  Dizziness, feeling lightheaded, or feeling like you might faint.  Chills or hot flashes.  Numbness or tingling in your lips, hands, or feet.  Feeling confused, or feeling that you are not yourself.  Fear of losing control or being emotionally unstable.  Fear of dying. How is this diagnosed? A panic attack is diagnosed with an assessment by your health care provider. During the assessment your health care provider will ask questions about:  Your history of anxiety, depression, and panic attacks.  Your medical history.  Whether you drink alcohol, use illegal drugs, take supplements, or take medicines. Be honest about your substance use. Your health care provider may also:  Order blood tests or other kinds of tests to rule out serious medical conditions.  Refer you to a mental health professional for further evaluation. How is this treated? Treatment depends on the cause of the panic attack:  If the cause is a medical problem, your  health care provider will either treat that problem or refer you to a specialist.  If the cause is emotional, you may be given anti-anxiety medicines or referred to a counselor. These medicines may reduce how often attacks happen, reduce how severe the attacks are, and lower anxiety.  If the cause is a medicine, your health care provider may tell you to stop the medicine, change your dose, or take a different  medicine.  If the cause is a drug, treatment may involve letting the drug wear off and taking medicine to help the drug leave your body or to counteract its effects. Attacks caused by drug abuse may continue even if you stop using the drug. Follow these instructions at home:  Take over-the-counter and prescription medicines only as told by your health care provider.  If you feel anxious, limit your caffeine intake.  Take good care of your physical and mental health by: ? Eating a balanced diet that includes plenty of fresh fruits and vegetables, whole grains, lean meats, and low-fat dairy. ? Getting plenty of rest. Try to get 7-8 hours of uninterrupted sleep each night. ? Exercising regularly. Try to get 30 minutes of physical activity at least 5 days a week. ? Not smoking. Talk to your health care provider if you need help quitting. ? Limiting alcohol intake to no more than 1 drink a day for nonpregnant women and 2 drinks a day for men. One drink equals 12 oz of beer, 5 oz of wine, or 1 oz of hard liquor.  Keep all follow-up visits as told by your health care provider. This is important. Panic attacks may have underlying physical or emotional problems that take time to accurately diagnose. Contact a health care provider if:  Your symptoms do not improve, or they get worse.  You are not able to take your medicine as prescribed because of side effects. Get help right away if:  You have serious thoughts about hurting yourself or others.  You have symptoms of a panic attack. Do not drive yourself to the hospital. Have someone else drive you or call an ambulance. If you ever feel like you may hurt yourself or others, or you have thoughts about taking your own life, get help right away. You can go to your nearest emergency department or call:  Your local emergency services (911 in the [place]  A suicide crisis helpline, such as the National Suicide Prevention Lifeline at 234-262-0255.  This is open 24 hours a day. Summary  A panic attack is a sign of a serious health or mental health condition. Get help right away. Do not drive yourself to the hospital. Have someone else drive you or call an ambulance.  Always see a health care provider to have the reasons for the panic attack correctly diagnosed.  If your panic attack was caused by a physical problem, follow your health care provider's suggestions for medicine, referral to a specialist, and lifestyle changes.  If your panic attack was caused by an emotional problem, follow through with counseling from a qualified mental health specialist.  If you feel like you may hurt yourself or others, call 911 and get help right away. This information is not intended to replace advice given to you by your health care provider. Make sure you discuss any questions you have with your health care provider. Document Revised: 08/04/2017 Document Reviewed: 09/30/2016 Elsevier Patient Education  2020 [employer].

## 2020-09-01 NOTE — Telephone Encounter (Signed)
Pharmacy called about potential problem with prescribing the fluoxetine along with her currently taking amitriptyline.   Please call pharmacy to confirm [phone]

## 2020-09-01 NOTE — Telephone Encounter (Signed)
She has d/c the amitriptyline.  Please contact pharmacy and notify them of this.

## 2020-09-01 NOTE — Telephone Encounter (Signed)
Pharmacist notified amitriptyline discontinued, ok to proceed.

## 2020-09-03 ENCOUNTER — Encounter: Payer: Self-pay | Admitting: Family Medicine

## 2020-09-03 MED ORDER — AMBULATORY NON FORMULARY MEDICATION: 0 refills | Status: DC

## 2020-09-08 ENCOUNTER — Emergency Department (HOSPITAL_COMMUNITY)
Admission: EM | Admit: 2020-09-08 | Discharge: 2020-09-09 | Disposition: A | Discharge status: Home / Self Care | Payer: Medicaid Other | Attending: Emergency Medicine | Admitting: Emergency Medicine

## 2020-09-08 ENCOUNTER — Other Ambulatory Visit: Payer: Self-pay

## 2020-09-08 ENCOUNTER — Encounter (HOSPITAL_COMMUNITY): Payer: Self-pay | Admitting: Pediatrics

## 2020-09-08 DIAGNOSIS — R42 Dizziness and giddiness: Secondary | ICD-10-CM | POA: Diagnosis not present

## 2020-09-08 DIAGNOSIS — R200 Anesthesia of skin: Secondary | ICD-10-CM | POA: Insufficient documentation

## 2020-09-08 DIAGNOSIS — R55 Syncope and collapse: Secondary | ICD-10-CM | POA: Insufficient documentation

## 2020-09-08 DIAGNOSIS — R0602 Shortness of breath: Secondary | ICD-10-CM | POA: Diagnosis not present

## 2020-09-08 DIAGNOSIS — Z8616 Personal history of COVID-19: Secondary | ICD-10-CM | POA: Diagnosis not present

## 2020-09-08 DIAGNOSIS — F419 Anxiety disorder, unspecified: Secondary | ICD-10-CM | POA: Diagnosis not present

## 2020-09-08 DIAGNOSIS — R079 Chest pain, unspecified: Secondary | ICD-10-CM | POA: Insufficient documentation

## 2020-09-08 DIAGNOSIS — G43909 Migraine, unspecified, not intractable, without status migrainosus: Secondary | ICD-10-CM | POA: Insufficient documentation

## 2020-09-08 DIAGNOSIS — R569 Unspecified convulsions: Secondary | ICD-10-CM | POA: Insufficient documentation

## 2020-09-08 LAB — CBC WITH DIFFERENTIAL/PLATELET
Abs Immature Granulocytes: 0 10*3/uL (ref 0.00–0.07)
Basophils Absolute: 0 10*3/uL (ref 0.0–0.1)
Basophils Relative: 1 %
Eosinophils Absolute: 0 10*3/uL (ref 0.0–0.5)
Eosinophils Relative: 1 %
HCT: 40.8 % (ref 36.0–46.0)
Hemoglobin: 13.2 g/dL (ref 12.0–15.0)
Immature Granulocytes: 0 %
Lymphocytes Relative: 37 %
Lymphs Abs: 1.5 10*3/uL (ref 0.7–4.0)
MCH: 28.1 pg (ref 26.0–34.0)
MCHC: 32.4 g/dL (ref 30.0–36.0)
MCV: 87 fL (ref 80.0–100.0)
Monocytes Absolute: 0.5 10*3/uL (ref 0.1–1.0)
Monocytes Relative: 12 %
Neutro Abs: 2.1 10*3/uL (ref 1.7–7.7)
Neutrophils Relative %: 49 %
Platelets: 329 10*3/uL (ref 150–400)
RBC: 4.69 MIL/uL (ref 3.87–5.11)
RDW: 11.9 % (ref 11.5–15.5)
WBC: 4.2 10*3/uL (ref 4.0–10.5)
nRBC: 0 % (ref 0.0–0.2)

## 2020-09-08 LAB — COMPREHENSIVE METABOLIC PANEL
ALT: 14 U/L (ref 0–44)
AST: 17 U/L (ref 15–41)
Albumin: 4.2 g/dL (ref 3.5–5.0)
Alkaline Phosphatase: 42 U/L (ref 38–126)
Anion gap: 7 (ref 5–15)
BUN: 10 mg/dL (ref 6–20)
CO2: 23 mmol/L (ref 22–32)
Calcium: 9.4 mg/dL (ref 8.9–10.3)
Chloride: 107 mmol/L (ref 98–111)
Creatinine, Ser: 0.95 mg/dL (ref 0.44–1.00)
GFR, Estimated: >60 mL/min (ref >60)
Glucose, Bld: 84 mg/dL (ref 70–99)
Potassium: 3.8 mmol/L (ref 3.5–5.1)
Sodium: 137 mmol/L (ref 135–145)
Total Bilirubin: 0.5 mg/dL (ref 0.3–1.2)
Total Protein: 7.5 g/dL (ref 6.5–8.1)

## 2020-09-08 LAB — TROPONIN I (HIGH SENSITIVITY)
Troponin I (High Sensitivity): <2 ng/L (ref <18)
Troponin I (High Sensitivity): <2 ng/L (ref <18)

## 2020-09-08 LAB — I-STAT BETA HCG BLOOD, ED (MC, WL, AP ONLY): I-stat hCG, quantitative: <5 m[IU]/mL (ref <5)

## 2020-09-08 NOTE — ED Triage Notes (Signed)
Stated she was dx w/ sz on 08/15/20 and currently takes topamax 50 mg QD. C/O migraine headache along with seizure symptoms which patient describes as " faint, feeling dizzy, hot / cold and feels like and episode is coming on"

## 2020-09-09 MED ORDER — SODIUM CHLORIDE 0.9 % IV BOLUS
1000.0000 mL | Freq: Once | INTRAVENOUS | Status: AC
  Administered 2020-09-09: 1000 mL via INTRAVENOUS

## 2020-09-09 MED ORDER — MECLIZINE HCL 12.5 MG PO TABS: 12.5000 mg | ORAL_TABLET | Freq: Three times a day (TID) | ORAL | 0 refills | Status: DC | PRN

## 2020-09-09 MED ORDER — KETOROLAC TROMETHAMINE 30 MG/ML IJ SOLN
30.0000 mg | Freq: Once | INTRAMUSCULAR | Status: AC
  Administered 2020-09-09: 30 mg via INTRAVENOUS
  Filled 2020-09-09: qty 1

## 2020-09-09 MED ORDER — PROCHLORPERAZINE EDISYLATE 10 MG/2ML IJ SOLN
10.0000 mg | Freq: Once | INTRAMUSCULAR | Status: AC
  Administered 2020-09-09: 10 mg via INTRAVENOUS
  Filled 2020-09-09: qty 2

## 2020-09-09 MED ORDER — DIPHENHYDRAMINE HCL 50 MG/ML IJ SOLN
25.0000 mg | Freq: Once | INTRAMUSCULAR | Status: AC
  Administered 2020-09-09: 25 mg via INTRAVENOUS
  Filled 2020-09-09: qty 1

## 2020-09-09 NOTE — ED Provider Notes (Signed)
[facility] EMERGENCY DEPARTMENT Provider Note   CSN: [id number] Arrival date & time: 09/08/20  1247     History Chief Complaint  Patient presents with  . Migraine  . Seizure symptoms  . Chest Pain    Olivia Kelly is a 35 y.o. female with a history of seizures, generalized anxiety disorder, COVID-19 who presents to the emergency department with a chief complaint of dizziness.  The patient reports that she had a seizure on 12/11.  Per chart review, this was while she was at a dance competition in [place].  The patient states that the seizure episode was caught on video and that this is the first time that she is ever actively seen herself having a seizure.  Since that time, she reports that she has been having 2-3 episodes a day where she "feels like she is going to have another seizure".  Notably, she has not had another seizure since 12/11.   She describes these symptoms as feeling hot all over, feeling very lightheaded and dizzy, she will have a bilateral frontal headache that may radiate down behind her eyes, nausea, chest pain that she characterizes as tightness that will radiate to her back, nausea, feeling as if her heart is racing, and shortness of breath.  She states the symptoms may last for up to 1 hour.  She did have 1 episode where she felt some numbness and tingling in her hands, but otherwise denies paresthesias.  She has been taking her home Topamax as prescribed.  No recent missed doses.  No recent medication adjustments.  She reports that she was evaluated for these same symptoms in the ER on 12/22, by primary care on 12/28, and was given a Zio patch monitor by cardiology on 12/28.  She is awaiting the results of the Zio patch.  During her ER visit, she was treated with IV fluids and Ativan and reported significant improvement.  She was discharged with a short course of Ativan, but reports that she has not really been taking the medication.  She was  also given a short course of clonazepam by primary care, but states that she has not yet tried the medication because she would get very nervous after reading of the side effects of the medication online and did not feel comfortable starting the medication.  She denies diaphoresis, tenderness, neck pain or stiffness, fever, chills, syncope, abdominal pain, vomiting, diarrhea, visual changes, weakness.  She reports that she has had to call out of work or leave work early several times due to the symptoms.  She does also report a history of previous panic attacks and does report increased episodes of panic attacks over the last few weeks.  The history is provided by the patient, medical records and a parent. No language interpreter was used.       Past Medical History:  Diagnosis Date  . Anemia due to blood loss, acute 04/03/2016  . History of COVID-19 12/12/2019  . Seizures (HCC)    last seizure 2015, on meds    Patient Active Problem List   Diagnosis Date Noted  . Generalized anxiety disorder 09/01/2020  . History of COVID-19 12/12/2019  . 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 Seizures (HCC) 04/04/2013    Past Surgical History:  Procedure Laterality Date  . cyst removed middle of chest at 39-63 years old    . CYSTOSCOPY WITH RETROGRADE PYELOGRAM, URETEROSCOPY AND STENT PLACEMENT Left  10/29/2014   Procedure: CYSTOSCOPY WITH LEFT RETROGRADE PYELOGRAM,  DIAGNOSTIC LEFT  URETEROSCOPY AND STENT PLACEMENT;  Surgeon: Sebastian Ache, MD;  Location: [facility];  Service: Urology;  Laterality: Left;  . CYSTOSCOPY WITH RETROGRADE PYELOGRAM, URETEROSCOPY AND STENT PLACEMENT Left 01/23/2015   Procedure: CYSTOSCOPY WITH LEFT RETROGRADE PYELOGRAM/URETERAL STENT PLACEMENT;  Surgeon: Sebastian Ache, MD;  Location: [facility];  Service: Urology;  Laterality: Left;  . IUD REMOVAL N/A 06/09/2015   Procedure: INTRAUTERINE DEVICE (IUD)  REMOVAL;  Surgeon: Allie Bossier, MD;  Location: [facility];  Service: Gynecology;  Laterality: N/A;  . NEPHRECTOMY     half of left kidney removed  . ROBOT ASSISTED PYELOPLASTY Left 01/23/2015   Procedure: ROBOTIC ASSISTED PYELOPLASTY WITH STENT PLACEMENT;  Surgeon: Sebastian Ache, MD;  Location: [facility];  Service: Urology;  Laterality: Left;     OB History    Gravida  2   Para  2   Term  2   Preterm  0   AB  0   Living  2     SAB  0   IAB  0   Ectopic  0   Multiple  0   Live Births  2           Family History  Problem Relation Age of Onset  . Diabetes Mother   . Hypertension Mother   . Diabetes Father   . Kidney failure Father   . Cancer Other     Social History   Tobacco Use  . Smoking status: Never Smoker  . Smokeless tobacco: Never Used  Vaping Use  . Vaping Use: Never used  Substance Use Topics  . Alcohol use: Yes    Comment: social  . Drug use: No    Home Medications Prior to Admission medications   Medication Sig Start Date End Date Taking? Authorizing Provider  meclizine (ANTIVERT) 12.5 MG tablet Take 1 tablet (12.5 mg total) by mouth 3 (three) times daily as needed for dizziness. 09/09/20  Yes Jasmeet Manton A, PA-C  AMBULATORY NON FORMULARY MEDICATION Single glucometer with lancets, test strips. Test daily 09/03/20   Rodolph Bong, MD  clonazePAM (KLONOPIN) 0.5 MG tablet Take 1 tablet (0.5 mg total) by mouth 2 (two) times daily. 09/01/20   Rodolph Bong, MD  cyclobenzaprine (FLEXERIL) 10 MG tablet Take 1 tablet (10 mg total) by mouth 2 (two) times daily as needed for muscle spasms. 04/18/20   Jeannie Fend, PA-C  FLUoxetine (PROZAC) 10 MG capsule Take 1 capsule (10 mg total) by mouth daily for 7 days, THEN 2 capsules (20 mg total) daily for 21 days. 09/01/20 09/29/20  Rodolph Bong, MD  lidocaine (LIDODERM) 5 % Place 1 patch onto the skin daily. Remove & Discard patch within 12 hours or as directed by MD 04/18/20   Jeannie Fend, PA-C  meloxicam  (MOBIC) 15 MG tablet Take 1 tablet (15 mg total) by mouth daily as needed (headache). 12/12/19   Rodolph Bong, MD  topiramate (TOPAMAX) 100 MG tablet Take 150 mg by mouth 2 (two) times daily.    [provider]  zolpidem (AMBIEN) 5 MG tablet Take 1 tablet (5 mg total) by mouth at bedtime as needed for sleep. 12/25/19   Rodolph Bong, MD    Allergies    Patient has no known allergies.  Review of Systems   Review of Systems  Constitutional: Negative for activity change, chills and fever.  HENT: Negative for  congestion and sore throat.   Eyes: Negative for visual disturbance.  Respiratory: Positive for shortness of breath.   Cardiovascular: Negative for chest pain and palpitations.  Gastrointestinal: Positive for nausea. Negative for abdominal pain, constipation, diarrhea and vomiting.  Genitourinary: Negative for dysuria.  Musculoskeletal: Negative for back pain, myalgias, neck pain and neck stiffness.  Skin: Negative for rash and wound.  Allergic/Immunologic: Negative for immunocompromised state.  Neurological: Positive for dizziness, light-headedness and headaches. Negative for syncope, speech difficulty and weakness.  Psychiatric/Behavioral: Negative for confusion.    Physical Exam Updated Vital Signs BP 115/72   Pulse 71   Temp 97.9 F (36.6 C) (Oral)   Resp 17   Ht 5\' 2"  (1.575 m)   Wt 78 kg   SpO2 100%   BMI 31.46 kg/m   Physical Exam Vitals and nursing note reviewed.  Constitutional:      General: She is not in acute distress.    Appearance: She is not ill-appearing, toxic-appearing or diaphoretic.  HENT:     Head: Normocephalic.     Mouth/Throat:     Mouth: Mucous membranes are moist.     Pharynx: No posterior oropharyngeal erythema.  Eyes:     Conjunctiva/sclera: Conjunctivae normal.  Cardiovascular:     Rate and Rhythm: Normal rate and regular rhythm.     Heart sounds: Normal heart sounds. No murmur heard. No friction rub. No gallop.   Pulmonary:      Effort: Pulmonary effort is normal. No respiratory distress.     Breath sounds: Normal breath sounds. No stridor. No wheezing, rhonchi or rales.  Chest:     Chest wall: No tenderness.  Abdominal:     General: There is no distension.     Palpations: Abdomen is soft. There is no mass.     Tenderness: There is no abdominal tenderness. There is no right CVA tenderness, left CVA tenderness, guarding or rebound.     Hernia: No hernia is present.  Musculoskeletal:     Cervical back: Neck supple.     Right lower leg: No edema.     Left lower leg: No edema.  Skin:    General: Skin is warm.     Capillary Refill: Capillary refill takes less than 2 seconds.     Coloration: Skin is not jaundiced or pale.     Findings: No erythema or rash.  Neurological:     Mental Status: She is alert.     Comments: Cranial nerves II through XII are grossly intact.  Alert and oriented x4.  5/5 strength against resistance of the bilateral upper and lower extremities.  Sensation is intact and equal throughout.  Heel-to-shin is normal.  Finger-to-nose is normal bilaterally without dysmetria.  Negative Romberg.  No pronator drift.  Gait is not ataxic.  No clonus bilaterally.  Psychiatric:        Mood and Affect: Mood is anxious.        Behavior: Behavior normal.     ED Results / Procedures / Treatments   Labs (all labs ordered are listed, but only abnormal results are displayed) Labs Reviewed  CBC WITH DIFFERENTIAL/PLATELET  COMPREHENSIVE METABOLIC PANEL  URINALYSIS, ROUTINE W REFLEX MICROSCOPIC  I-STAT BETA HCG BLOOD, ED ([facility], [facility], [facility] ONLY)  TROPONIN I (HIGH SENSITIVITY)  TROPONIN I (HIGH SENSITIVITY)    EKG None  Radiology No results found.  Procedures Procedures (including critical care time)  Medications Ordered in ED Medications  sodium chloride 0.9 % bolus 1,000 mL (0  mLs Intravenous Stopped 09/09/20 0350)  ketorolac (TORADOL) 30 MG/ML injection 30 mg (30 mg Intravenous Given 09/09/20 0230)   prochlorperazine (COMPAZINE) injection 10 mg (10 mg Intravenous Given 09/09/20 0230)  diphenhydrAMINE (BENADRYL) injection 25 mg (25 mg Intravenous Given 09/09/20 0230)    ED Course  I have reviewed the triage vital signs and the nursing notes.  Pertinent labs & imaging results that were available during my care of the patient were reviewed by me and considered in my medical decision making (see chart for details).    MDM Rules/Calculators/A&P                          35 year old female with a history of seizures, generalized anxiety disorder, COVID-19 who presents the emergency department with recurrent episodes of dizziness and lightheadedness, headaches, chest pain or shortness of breath, nausea, and feeling as if her heart is racing but has been occurring approximately 2-3 times a day since she had a breakthrough seizure on December 11.  She also does report that she has been having increasing episodes of panic attacks.  Since these episodes began, she has been evaluated by the ER, cardiology, primary care, and has reached out to her neurologist office.  She does note that this is the first time that she has ever had a seizure where the episode was recorded and she watched the video.  She now feels that these episodes are similar to when she had a breakthrough seizure and she is concerned that she is going to have more seizure-like activity despite compliance with her home anticonvulsant medications.  The patient has now been observed for more than 15 hours in the emergency department.  Vital signs are stable.  Her physical exam is reassuring.  Labs and imaging have been reviewed and independently interpreted by me.  EKG unchanged from previous. Chest x-ray is unremarkable. Delta troponin is not elevated.  Hear score is 1. No metabolic derangements.  Work-up is otherwise reassuring.  She is very anxious today, and I am concerned that she is having worsening anxiety regarding having seen  herself having a seizure and this is contributing to her symptoms.  Less likely, but could also consider dysautonomia, paroxysmal A. Fib, vasovagal near syncope.  I am less suspicious for PE, tension pneumothorax, aortic dissection, or MS.  She denies SI and HI today.  Since the patient was having a headache, she was treated with a migraine cocktail and IV fluids.  She did report that she was feeling much improved.  She has a good outpatient care team involving family medicine, neurology, and cardiology.  I have advised her to schedule a follow-up appointment with neurology as well as with primary care in 2 follow-up with cardiology regarding her Zio patch results.  All questions answered.  She is hemodynamically stable no acute distress.  Safe for discharge home with outpatient follow-up as indicated.  Final Clinical Impression(s) / ED Diagnoses Final diagnoses:  Dizziness  Anxiety  Near syncope    Rx / DC Orders ED Discharge Orders         Ordered    meclizine (ANTIVERT) 12.5 MG tablet  3 times daily PRN        09/09/20 0301           Shatyra Becka A, PA-C 09/09/20 1610    Shon Baton, MD 09/10/20 0120

## 2020-09-09 NOTE — Discharge Instructions (Addendum)
Thank you for allowing me to care for you today in the Emergency Department.   You can try taking 1 tablet of meclizine every 8 hours for dizziness.  Use caution until you know how this medication impacts you as it may make you drowsy.  Please follow-up with primary care.  You should also be on the look out for your results from your ZIO monitor from cardiology.   Since you have been having more frequent headaches, I would also recommend following up with neurology.  As we discussed, your work-up today was reassuring.  Return to the emergency department if you pass out, if you have new seizure-like activity, if your fingers or lips turn blue, if you have respiratory distress, if you become unable to walk, or if you develop other new, concerning symptoms.

## 2020-09-12 ENCOUNTER — Ambulatory Visit (HOSPITAL_COMMUNITY)
Admission: EM | Admit: 2020-09-12 | Discharge: 2020-09-12 | Disposition: A | Discharge status: Home / Self Care | Payer: Medicaid Other | Attending: Urgent Care | Admitting: Urgent Care

## 2020-09-12 ENCOUNTER — Encounter (HOSPITAL_COMMUNITY): Payer: Self-pay | Admitting: Emergency Medicine

## 2020-09-12 ENCOUNTER — Ambulatory Visit (INDEPENDENT_AMBULATORY_CARE_PROVIDER_SITE_OTHER): Payer: Medicaid Other

## 2020-09-12 ENCOUNTER — Other Ambulatory Visit: Payer: Self-pay

## 2020-09-12 DIAGNOSIS — R0789 Other chest pain: Secondary | ICD-10-CM

## 2020-09-12 DIAGNOSIS — R079 Chest pain, unspecified: Secondary | ICD-10-CM | POA: Diagnosis not present

## 2020-09-12 MED ORDER — IBUPROFEN 800 MG PO TABS: 800.0000 mg | ORAL_TABLET | Freq: Three times a day (TID) | ORAL | 0 refills | Status: DC | PRN

## 2020-09-12 NOTE — ED Triage Notes (Signed)
Pt states that she has been chest pain mostly in the middle closer to the left side. Pt states that she feels tingling in her legs and stomach that causes her to feel nauseous. Pt states that she noticed her chest pain a month ago.

## 2020-09-12 NOTE — Discharge Instructions (Signed)
Follow up with your primary care MD for recheck.  Return if any problems.

## 2020-09-12 NOTE — ED Provider Notes (Signed)
[facility]    CSN: [id number] Arrival date & time: 09/12/20  1106      History   Chief Complaint Chief Complaint  Patient presents with  . Chest Pain    HPI Olivia Kelly is a 35 y.o. female.   The history is provided by the patient. No language interpreter was used.  Chest Pain Pain location:  L chest Pain quality: aching   Pain radiates to:  Does not radiate Pain severity:  Moderate Onset quality:  Gradual Duration:  1 month Timing:  Constant Progression:  Worsening Relieved by:  Nothing Worsened by:  Nothing Associated symptoms: no cough   Pt reports soreness in her chest.  Pt denies cough or fever.  Pt reports she noticed about a month ago after having a seizure.  Pt denies any injury.    Past Medical History:  Diagnosis Date  . Anemia due to blood loss, acute 04/03/2016  . History of COVID-19 12/12/2019  . Seizures (HCC)    last seizure 2015, on meds    Patient Active Problem List   Diagnosis Date Noted  . Generalized anxiety disorder 09/01/2020  . History of COVID-19 12/12/2019  . 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 Seizures (HCC) 04/04/2013    Past Surgical History:  Procedure Laterality Date  . cyst removed middle of chest at 63-9 years old    . CYSTOSCOPY WITH RETROGRADE PYELOGRAM, URETEROSCOPY AND STENT PLACEMENT Left 10/29/2014   Procedure: CYSTOSCOPY WITH LEFT RETROGRADE PYELOGRAM,  DIAGNOSTIC LEFT  URETEROSCOPY AND STENT PLACEMENT;  Surgeon: Sebastian Ache, MD;  Location: [facility];  Service: Urology;  Laterality: Left;  . CYSTOSCOPY WITH RETROGRADE PYELOGRAM, URETEROSCOPY AND STENT PLACEMENT Left 01/23/2015   Procedure: CYSTOSCOPY WITH LEFT RETROGRADE PYELOGRAM/URETERAL STENT PLACEMENT;  Surgeon: Sebastian Ache, MD;  Location: [facility];  Service: Urology;  Laterality: Left;  . IUD REMOVAL N/A 06/09/2015   Procedure: INTRAUTERINE DEVICE (IUD) REMOVAL;  Surgeon: Allie Bossier, MD;  Location: [facility];  Service: Gynecology;  Laterality: N/A;  . NEPHRECTOMY     half of left kidney removed  . ROBOT ASSISTED PYELOPLASTY Left 01/23/2015   Procedure: ROBOTIC ASSISTED PYELOPLASTY WITH STENT PLACEMENT;  Surgeon: Sebastian Ache, MD;  Location: [facility];  Service: Urology;  Laterality: Left;    OB History    Gravida  2   Para  2   Term  2   Preterm  0   AB  0   Living  2     SAB  0   IAB  0   Ectopic  0   Multiple  0   Live Births  2            Home Medications    Prior to Admission medications   Medication Sig Start Date End Date Taking? Authorizing Provider  AMBULATORY NON FORMULARY MEDICATION Single glucometer with lancets, test strips. Test daily 09/03/20   Rodolph Bong, MD  clonazePAM (KLONOPIN) 0.5 MG tablet Take 1 tablet (0.5 mg total) by mouth 2 (two) times daily. 09/01/20   Rodolph Bong, MD  cyclobenzaprine (FLEXERIL) 10 MG tablet Take 1 tablet (10 mg total) by mouth 2 (two) times daily as needed for muscle spasms. 04/18/20   Jeannie Fend, PA-C  FLUoxetine (PROZAC) 10 MG capsule Take 1 capsule (10 mg total) by mouth daily for 7 days, THEN 2 capsules (20 mg total) daily for 21 days. 09/01/20  09/29/20  Rodolph Bong, MD  lidocaine (LIDODERM) 5 % Place 1 patch onto the skin daily. Remove & Discard patch within 12 hours or as directed by MD 04/18/20   Jeannie Fend, PA-C  meclizine (ANTIVERT) 12.5 MG tablet Take 1 tablet (12.5 mg total) by mouth 3 (three) times daily as needed for dizziness. 09/09/20   McDonald, Mia A, PA-C  meloxicam (MOBIC) 15 MG tablet Take 1 tablet (15 mg total) by mouth daily as needed (headache). 12/12/19   Rodolph Bong, MD  topiramate (TOPAMAX) 100 MG tablet Take 150 mg by mouth 2 (two) times daily.    [provider]  zolpidem (AMBIEN) 5 MG tablet Take 1 tablet (5 mg total) by mouth at bedtime as needed for sleep. 12/25/19   Rodolph Bong, MD    Family History Family History  Problem Relation Age of  Onset  . Diabetes Mother   . Hypertension Mother   . Diabetes Father   . Kidney failure Father   . Cancer Other     Social History Social History   Tobacco Use  . Smoking status: Never Smoker  . Smokeless tobacco: Never Used  Vaping Use  . Vaping Use: Never used  Substance Use Topics  . Alcohol use: Yes    Comment: social  . Drug use: No     Allergies   Patient has no known allergies.   Review of Systems Review of Systems  Respiratory: Negative for cough.   Cardiovascular: Positive for chest pain.  All other systems reviewed and are negative.    Physical Exam Triage Vital Signs ED Triage Vitals  Enc Vitals Group     BP 09/12/20 1159 114/66     Pulse Rate 09/12/20 1159 86     Resp 09/12/20 1159 18     Temp 09/12/20 1159 98.5 F (36.9 C)     Temp Source 09/12/20 1159 Oral     SpO2 09/12/20 1159 100 %     Weight --      Height --      Head Circumference --      Peak Flow --      Pain Score 09/12/20 1157 0     Pain Loc --      Pain Edu? --      Excl. in GC? --    No data found.  Updated Vital Signs BP 114/66 (BP Location: Right Arm)   Pulse 86   Temp 98.5 F (36.9 C) (Oral)   Resp 18   LMP 08/14/2020 (Exact Date)   SpO2 100%   Breastfeeding No   Visual Acuity Right Eye Distance:   Left Eye Distance:   Bilateral Distance:    Right Eye Near:   Left Eye Near:    Bilateral Near:     Physical Exam Vitals and nursing note reviewed.  Constitutional:      Appearance: She is well-developed and well-nourished.  HENT:     Head: Normocephalic.  Eyes:     Extraocular Movements: EOM normal.  Cardiovascular:     Rate and Rhythm: Normal rate and regular rhythm.     Heart sounds: Normal heart sounds.  Pulmonary:     Effort: Pulmonary effort is normal.     Breath sounds: Normal breath sounds.  Abdominal:     General: There is no distension.     Palpations: Abdomen is soft.  Musculoskeletal:        General: Normal range of motion.  Cervical  back: Normal range of motion.  Neurological:     General: No focal deficit present.     Mental Status: She is alert and oriented to person, place, and time.  Psychiatric:        Mood and Affect: Mood and affect normal.      UC Treatments / Results  Labs (all labs ordered are listed, but only abnormal results are displayed) Labs Reviewed - No data to display  EKG   Radiology DG Chest 2 View  Result Date: 09/12/2020 CLINICAL DATA:  35 year old female with chest pain. EXAM: CHEST - 2 VIEW COMPARISON:  11/06/2018 FINDINGS: The heart size and mediastinal contours are within normal limits. Both lungs are clear. The visualized skeletal structures are unremarkable. IMPRESSION: No acute cardiopulmonary process. Electronically Signed   By: Marliss Coots MD   On: 09/12/2020 13:09    Procedures Procedures (including critical care time)  Medications Ordered in UC Medications - No data to display  Initial Impression / Assessment and Plan / UC Course  I have reviewed the triage vital signs and the nursing notes.  Pertinent labs & imaging results that were available during my care of the patient were reviewed by me and considered in my medical decision making (see chart for details).     EKG no acute abnormality,  Chest xray no abnormality. I will have pt try a short course of ibuprofen and follow up with primary care  Final Clinical Impressions(s) / UC Diagnoses   Final diagnoses:  Chest wall pain   Discharge Instructions   None    ED Prescriptions    Medication Sig Dispense Auth. Provider   ibuprofen (ADVIL) 800 MG tablet Take 1 tablet (800 mg total) by mouth every 8 (eight) hours as needed. 30 tablet Elson Areas, [place]     PDMP not reviewed this encounter.  An After Visit Summary was printed and given to the patient.    Elson Areas, [place] 09/12/20 1318

## 2020-09-18 NOTE — Progress Notes (Signed)
Event monitor to check heart rhythm looked reassuringly normal.  No evidence of dangerous heart rhythm.

## 2020-09-22 ENCOUNTER — Ambulatory Visit: Payer: Medicaid Other | Admitting: Family Medicine

## 2020-09-22 NOTE — Progress Notes (Deleted)
   I, Philbert Riser, LAT, ATC acting as a scribe for Olivia Graham, MD.  Olivia Kelly is a 35 y.o. female who presents to [facility] at [facility] today for f/u //. Pt was last seen by Dr. [name] on 12/ and was advised to plan for a Holter monitor, check her BP, blood glucose, and HR more regularly, and treat w/ Klonopin and Prozac. Of note, pt was seen in the ED on 09/08/20 for dizziness and was treated w/ Ativan. Pt was seen again at Urgent Care on 09/12/20 for chest pain. Today, pt reports//.   Pertinent review of systems: ***  Relevant historical information: ***   Exam:  There were no vitals taken for this visit. General: Well Developed, well nourished, and in no acute distress.   MSK: ***    Lab and Radiology Results No results found for this or any previous visit (from the past 72 hour(s)). No results found.     Assessment and Plan: 35 y.o. female with ***   PDMP not reviewed this encounter. No orders of the defined types were placed in this encounter.  No orders of the defined types were placed in this encounter.    Discussed warning signs or symptoms. Please see discharge instructions. Patient expresses understanding.   ***

## 2020-09-28 NOTE — Progress Notes (Unsigned)
   I, Christoper Fabian, LAT, ATC, am serving as scribe for Dr. [name].  Olivia Kelly is a 35 y.o. female who presents to [facility] at [facility] today for f/u anxiety and heart palpitations. Pt was last seen by Dr. [name] on 09/01/20 and was advised to use a Holter monitor, check BP and HR regularly and was prescribed Klonopin and Prozac. Of note, pt has a hx of seizures and anxiety, of which she was seen in the ED on 08/26/20, 09/08/20, and urgent care on 09/12/20. Today, pt reports that she is doing well today and feeling better.  She has had a few bouts of anxiety over the past few weeks to a month.  She states that her bouts of anxiety/panic have been more intermittent since her last visit to the ED on 09/12/20.    Dx testing: 09/12/20 and 09/08/20 EKG  09/12/20 Chest XR  04/17/20 L shoulder XR  11/20/19 C-spine CT             11/20/19 Head CT  Pertinent review of systems: No fevers or chills  Relevant historical information: Seizure disorder   Exam:  BP 108/78 (BP Location: Left Arm, Patient Position: Sitting, Cuff Size: Normal)   Pulse 77   Ht 5\' 2"  (1.575 m)   Wt 169 lb 6.4 oz (76.8 kg)   SpO2 98%   BMI 30.98 kg/m  General: Well Developed, well nourished, and in no acute distress.   Psych: Normal speech thought process and affect.    Lab and Radiology Results  Holter Monitor Results:  Patch Wear Time:  2 days and 20 hours (2021-12-30T18:34:43-0500 to 2022-01-02T15:21:29-0500)  Patient had a min HR of 55 bpm, max HR of 152 bpm, and avg HR of 79 bpm. Predominant underlying rhythm was Sinus Rhythm. Isolated SVEs were rare (<1.0%), and no SVE Couplets or SVE Triplets were present. No Isolated VEs, VE Couplets, or VE Triplets were  present.    Additionally reviewed patient's glucometer results.  No hypoglycemic episodes.  No blood sugars less than 70.    Assessment and Plan: 35 y.o. female with anxiety and panic at attacks.  Patient's episodes are very likely  to be panic attacks.  Patient has had extensive work-up recently.  Anxiety worsened following concussion.  For anxiety I started fluoxetine at 10 mg and increased to 20 mg daily after 1 week.  She currently is taking 20 mg daily and doing pretty well.  Additionally prescribed short-term Klonopin which will be stopping now.  Plan for recheck in a month.  If stable in 1 month we will be referring to PCP.    PDMP not reviewed this encounter. No orders of the defined types were placed in this encounter.  Meds ordered this encounter  Medications  . FLUoxetine (PROZAC) 20 MG capsule    Sig: Take 1 capsule (20 mg total) by mouth daily.    Dispense:  90 capsule    Refill:  1     Discussed warning signs or symptoms. Please see discharge instructions. Patient expresses understanding.   The above documentation has been reviewed and is accurate and complete 34, M.D.

## 2020-09-29 ENCOUNTER — Encounter: Payer: Self-pay | Admitting: Family Medicine

## 2020-09-29 ENCOUNTER — Other Ambulatory Visit: Payer: Self-pay

## 2020-09-29 ENCOUNTER — Ambulatory Visit (INDEPENDENT_AMBULATORY_CARE_PROVIDER_SITE_OTHER): Payer: Medicaid Other | Admitting: Family Medicine

## 2020-09-29 VITALS — BP 108/78 | HR 77 | Ht 62.0 in | Wt 169.4 lb

## 2020-09-29 DIAGNOSIS — F411 Generalized anxiety disorder: Secondary | ICD-10-CM

## 2020-09-29 MED ORDER — FLUOXETINE HCL 20 MG PO CAPS: 20.0000 mg | ORAL_CAPSULE | Freq: Every day | ORAL | 1 refills | Status: DC

## 2020-09-29 NOTE — Patient Instructions (Addendum)
Thank you for coming in today. STOP Klonipin.   Continue prozac at 20mg  daily (new pill).   Recheck with me in 1 month.   Let me know if things are not going well.   Make sure your neurologist knows the last day you took Klonopin.

## 2020-10-19 ENCOUNTER — Other Ambulatory Visit: Payer: Self-pay | Admitting: Family Medicine

## 2020-10-20 NOTE — Telephone Encounter (Signed)
Please advise 

## 2020-10-28 NOTE — Progress Notes (Signed)
   I, Olivia Kelly, LAT, ATC acting as a scribe for [name], MD.  Olivia Kelly is a 35 y.o. female who presents to [facility] at [facility] today for f/u GAD. Pt was last seen by Dr. [name] on 09/29/20 . Of note, pt has a hx of seizures and anxiety, of which she was seen in the ED on 08/26/20, 09/08/20, and urgent care on 09/12/20. Today, pt reports that she's been doing ok and just taking one day at a time.  She states that she has not had any recent bouts of anxiety.  Overall she is feeling a lot better and her anxiety is much better controlled.  She no longer has any concussion symptoms.  Concussion was the original reason for visit with me.   Pertinent review of systems: No fevers or chills  Relevant historical information: Seizure disorder   Exam:  BP 112/86 (BP Location: Left Arm, Patient Position: Sitting, Cuff Size: Normal)   Pulse 71   Ht 5\' 2"  (1.575 m)   Wt 174 lb 6.4 oz (79.1 kg)   SpO2 98%   BMI 31.90 kg/m  General: Well Developed, well nourished, and in no acute distress.   Neuropsych: Alert and oriented normal coordination balance and gait.  Normal speech thought process and affect.     Assessment and Plan: 35 y.o. female with anxiety disorder thought to be caused originally by concussion.  At this point concussion has resolved and anxiety is probably more generalized anxiety disorder.  Currently well managed with Prozac.  Plan to continue Prozac for 3 to 6 months at least.  Recheck back with me as needed.  Recommended patient establish care with a primary care provider to continue anxiety related care indefinitely.  Recommend providers at our [address].   Discussed warning signs or symptoms. Please see discharge instructions. Patient expresses understanding.   The above documentation has been reviewed and is accurate and complete [employer], M.D.  Total encounter time 20 minutes including face-to-face time with the patient  and, reviewing past medical record, and charting on the date of service.   Treatment plan options and backup plan.

## 2020-10-29 ENCOUNTER — Other Ambulatory Visit: Payer: Self-pay

## 2020-10-29 ENCOUNTER — Encounter: Payer: Self-pay | Admitting: Family Medicine

## 2020-10-29 ENCOUNTER — Ambulatory Visit (INDEPENDENT_AMBULATORY_CARE_PROVIDER_SITE_OTHER): Payer: Medicaid Other | Admitting: Family Medicine

## 2020-10-29 VITALS — BP 112/86 | HR 71 | Ht 62.0 in | Wt 174.4 lb

## 2020-10-29 DIAGNOSIS — F411 Generalized anxiety disorder: Secondary | ICD-10-CM

## 2020-10-29 NOTE — Patient Instructions (Signed)
Thank you for coming in today.  I think trying to establish care at [facility] with Rinaldo Cloud is a great option.   I will cotninue your prozac for up to a year if needed.   Recheck with me as needed.   Address: [address] [place], [place], [place] [address] Phone: [phone] Appointments: [email or web address]

## 2021-02-09 ENCOUNTER — Other Ambulatory Visit: Payer: Self-pay | Admitting: Family Medicine

## 2021-02-10 NOTE — Telephone Encounter (Signed)
Please advise 

## 2021-02-23 ENCOUNTER — Ambulatory Visit: Payer: Medicaid Other | Admitting: Nurse Practitioner

## 2021-03-29 ENCOUNTER — Ambulatory Visit: Payer: Medicaid Other | Admitting: Student

## 2021-04-08 ENCOUNTER — Other Ambulatory Visit: Payer: Self-pay

## 2021-04-08 ENCOUNTER — Encounter: Payer: Self-pay | Admitting: Student

## 2021-04-08 ENCOUNTER — Other Ambulatory Visit (HOSPITAL_COMMUNITY)
Admission: RE | Admit: 2021-04-08 | Discharge: 2021-04-08 | Disposition: A | Discharge status: Home / Self Care | Payer: Medicaid Other | Source: Ambulatory Visit | Attending: Student | Admitting: Student

## 2021-04-08 ENCOUNTER — Ambulatory Visit (INDEPENDENT_AMBULATORY_CARE_PROVIDER_SITE_OTHER): Payer: Medicaid Other | Admitting: Student

## 2021-04-08 VITALS — BP 116/78 | HR 76 | Ht 62.5 in | Wt 171.1 lb

## 2021-04-08 DIAGNOSIS — Z124 Encounter for screening for malignant neoplasm of cervix: Secondary | ICD-10-CM | POA: Diagnosis not present

## 2021-04-08 DIAGNOSIS — Z01419 Encounter for gynecological examination (general) (routine) without abnormal findings: Secondary | ICD-10-CM

## 2021-04-08 DIAGNOSIS — R102 Pelvic and perineal pain: Secondary | ICD-10-CM

## 2021-04-08 DIAGNOSIS — Z113 Encounter for screening for infections with a predominantly sexual mode of transmission: Secondary | ICD-10-CM

## 2021-04-08 NOTE — Progress Notes (Signed)
  History:  [name] is a 35 y.o. [id number] who presents to clinic today for annual well woman exam with a pap smear.   She endorses 5/10 lower abdominal pain for the past 2 days. She first felt the pain in her lower back and now it has moved to the suprapubic region of her abdomen. She has felt slight nausea but denies fever, vomiting, diarrhea, constipation, dysuria, or abnormal vaginal discharge. She has bowel movements regularly. She believes this could possibly be gas pains.   The following portions of the patient's history were reviewed and updated as appropriate: allergies, current medications, family history, past medical history, social history, past surgical history and problem list.  Review of Systems:  Review of Systems  Constitutional:  Negative for fever.  Gastrointestinal:  Positive for abdominal pain (lower abdominal pain, 5/10) and nausea. Negative for constipation, diarrhea and vomiting.  Genitourinary:  Negative for dysuria and hematuria.  Musculoskeletal:  Positive for back pain (lower back pain yesterday).     Objective:  Physical Exam There were no vitals taken for this visit. Physical Exam Exam conducted with a chaperone present.  Constitutional:      Appearance: Normal appearance.  HENT:     Head: Normocephalic and atraumatic.  Eyes:     General: No scleral icterus. Cardiovascular:     Rate and Rhythm: Normal rate and regular rhythm.     Heart sounds: Normal heart sounds.  Pulmonary:     Effort: Pulmonary effort is normal.     Breath sounds: Normal breath sounds.  Chest:  Breasts:    Right: No inverted nipple, nipple discharge, skin change or tenderness.     Left: Normal. No inverted nipple, nipple discharge, skin change or tenderness.  Abdominal:     General: Abdomen is flat. Bowel sounds are normal.     Palpations: Abdomen is soft.  Genitourinary:    General: Normal vulva.     Labia:        Right: No rash.        Left: No rash.       Vagina: Normal.     Cervix: Normal.     Adnexa: Right adnexa normal and left adnexa normal.  Neurological:     Mental Status: She is alert.     Labs and Imaging No results found for this or any previous visit (from the past 24 hour(s)).  No results found.  Health Maintenance Due  Topic Date Due   COVID-19 Vaccine (1) Never done   Pneumococcal Vaccine 71-54 Years old (1 - PCV) Never done   INFLUENZA VACCINE  04/05/2021   PAP SMEAR-Modifier  06/08/2021     Assessment & Plan:  1. Encounter for well woman exam with routine gynecological exam  Breast exam, bimanual, and pap smear performed   2. Cervical cancer screening - Cytology - PAP( [place])  3. Screening for STD (sexually transmitted disease) - Cytology Pap Ancillary Testing  - HIV Antibody  - RPR - Hepatitis B surface antigen  - Hepatitis C antibody   4. Suprapubic abdominal pain  No fever, vomiting, diarrhea, constipation, dysuria, or abnormal vaginal discharge  Having regular menstrual cycles   Athena Masse, Student-PA 04/08/2021 11:27 AM

## 2021-04-08 NOTE — Progress Notes (Signed)
Pt states that she has been having pain in her lower abdomen since last night.

## 2021-04-09 LAB — HIV ANTIBODY (ROUTINE TESTING W REFLEX): HIV Screen 4th Generation wRfx: NONREACTIVE

## 2021-04-09 LAB — RPR: RPR Ser Ql: NONREACTIVE

## 2021-04-09 LAB — HEPATITIS B SURFACE ANTIGEN: Hepatitis B Surface Ag: NEGATIVE

## 2021-04-09 LAB — HEPATITIS C ANTIBODY: Hep C Virus Ab: <0.1 s/co ratio (ref 0.0–0.9)

## 2021-04-16 LAB — CYTOLOGY - PAP
Chlamydia: NEGATIVE
Comment: NEGATIVE
Comment: NEGATIVE
Comment: NEGATIVE
Comment: NEGATIVE
Comment: NORMAL
Diagnosis: NEGATIVE
HPV 16: NEGATIVE
HPV 18 / 45: NEGATIVE
High risk HPV: POSITIVE — AB
Neisseria Gonorrhea: NEGATIVE
Trichomonas: NEGATIVE

## 2021-05-26 ENCOUNTER — Ambulatory Visit: Payer: Medicaid Other | Admitting: Physician Assistant

## 2021-06-21 ENCOUNTER — Other Ambulatory Visit: Payer: Self-pay | Admitting: Family Medicine

## 2021-06-22 MED ORDER — TRAZODONE HCL 50 MG PO TABS: 50.0000 mg | ORAL_TABLET | Freq: Every day | ORAL | 0 refills | Status: DC

## 2021-08-24 ENCOUNTER — Emergency Department (HOSPITAL_BASED_OUTPATIENT_CLINIC_OR_DEPARTMENT_OTHER): Payer: Medicaid Other | Admitting: Radiology

## 2021-08-24 ENCOUNTER — Ambulatory Visit: Payer: Medicaid Other | Admitting: Physician Assistant

## 2021-08-24 ENCOUNTER — Other Ambulatory Visit: Payer: Self-pay

## 2021-08-24 ENCOUNTER — Emergency Department (HOSPITAL_BASED_OUTPATIENT_CLINIC_OR_DEPARTMENT_OTHER)
Admission: EM | Admit: 2021-08-24 | Discharge: 2021-08-24 | Disposition: A | Discharge status: Home / Self Care | Payer: Medicaid Other | Attending: Emergency Medicine | Admitting: Emergency Medicine

## 2021-08-24 ENCOUNTER — Encounter (HOSPITAL_BASED_OUTPATIENT_CLINIC_OR_DEPARTMENT_OTHER): Payer: Self-pay

## 2021-08-24 DIAGNOSIS — X500XXA Overexertion from strenuous movement or load, initial encounter: Secondary | ICD-10-CM | POA: Insufficient documentation

## 2021-08-24 DIAGNOSIS — Z8616 Personal history of COVID-19: Secondary | ICD-10-CM | POA: Insufficient documentation

## 2021-08-24 DIAGNOSIS — S43402A Unspecified sprain of left shoulder joint, initial encounter: Secondary | ICD-10-CM | POA: Diagnosis not present

## 2021-08-24 DIAGNOSIS — S4992XA Unspecified injury of left shoulder and upper arm, initial encounter: Secondary | ICD-10-CM | POA: Diagnosis present

## 2021-08-24 LAB — PREGNANCY, URINE: Preg Test, Ur: NEGATIVE

## 2021-08-24 MED ORDER — IBUPROFEN 400 MG PO TABS
400.0000 mg | ORAL_TABLET | Freq: Once | ORAL | Status: AC
  Administered 2021-08-24: 400 mg via ORAL
  Filled 2021-08-24: qty 1

## 2021-08-24 MED ORDER — ACETAMINOPHEN 500 MG PO TABS
1000.0000 mg | ORAL_TABLET | Freq: Once | ORAL | Status: AC
  Administered 2021-08-24: 1000 mg via ORAL
  Filled 2021-08-24: qty 2

## 2021-08-24 MED ORDER — NAPROXEN 500 MG PO TBEC: 500.0000 mg | DELAYED_RELEASE_TABLET | Freq: Two times a day (BID) | ORAL | 0 refills | Status: DC

## 2021-08-24 NOTE — ED Triage Notes (Signed)
Patient had a seizure on Thursday and hasn't been able to use left arm since. Patient states this has happened before and her shoulder was dislocated. Patient reports when she tries to move it certain ways it pops. She is having pain from collar bone to midway down there humerus.

## 2021-08-24 NOTE — Discharge Instructions (Addendum)
Wear the sling for comfort but make sure you are ranging it 10-15 times a day so it doesn't get stiff.  You can also use heat and ice.  In addition to the prescription you can also take extra strength tylenol every 6 hours for pain.

## 2021-08-24 NOTE — ED Provider Notes (Signed)
[facility] EMERGENCY DEPT Provider Note   CSN: [id number] Arrival date & time: 08/24/21  2240     History No chief complaint on file.   Olivia Kelly is a 35 y.o. female.  Pt is a 35y/o female with hx of sz disorder, anemia and anxiety who is presenting today with ongoing shoulder pain since Thursday of last week (5days ago).  Pt reports she had a sz on Thursday and mom reports it was a pretty bad one and she was jerking a lot and they had rolled her on her left side. She did not fall but she reports that after the sz she went to sleep on the couch and since waking up she has had pain in the left shoulder and trouble lifting it due to pain.  She has injured the same shoulder about 1 year ago when she had a sz but the pain eventually went away.  The pain is 7/10 and worse with lifting the arm.  She feels it all around the shoulder and it radiates to the elbow.  No numbness or weakness in the hand.  No other areas of injury.  She is right handed.  She has tried muscle cream and lidocaine patch without improvement.  The history is provided by the patient.      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   Seizures (HCC) 04/04/2013    Past Surgical History:  Procedure Laterality Date   cyst removed middle of chest at 46-95 years old     CYSTOSCOPY WITH RETROGRADE PYELOGRAM, URETEROSCOPY AND STENT PLACEMENT Left 10/29/2014   Procedure: CYSTOSCOPY WITH LEFT RETROGRADE PYELOGRAM,  DIAGNOSTIC LEFT  URETEROSCOPY AND STENT PLACEMENT;  Surgeon: Sebastian Ache, MD;  Location: [facility];  Service: Urology;  Laterality: Left;    CYSTOSCOPY WITH RETROGRADE PYELOGRAM, URETEROSCOPY AND STENT PLACEMENT Left 01/23/2015   Procedure: CYSTOSCOPY WITH LEFT RETROGRADE PYELOGRAM/URETERAL STENT PLACEMENT;  Surgeon: Sebastian Ache, MD;  Location: [facility];  Service: Urology;  Laterality: Left;   IUD REMOVAL N/A 06/09/2015   Procedure: INTRAUTERINE DEVICE (IUD) REMOVAL;  Surgeon: Allie Bossier, MD;  Location: [facility];  Service: Gynecology;  Laterality: N/A;   NEPHRECTOMY     half of left kidney removed   ROBOT ASSISTED PYELOPLASTY Left 01/23/2015   Procedure: ROBOTIC ASSISTED PYELOPLASTY WITH STENT PLACEMENT;  Surgeon: Sebastian Ache, MD;  Location: [facility];  Service: Urology;  Laterality: Left;     OB History     Gravida  2   Para  2   Term  2   Preterm  0   AB  0   Living  2      SAB  0   IAB  0   Ectopic  0   Multiple  0   Live Births  2           Family History  Problem Relation Age of Onset   Diabetes Mother    Hypertension Mother    Diabetes Father    Kidney failure Father    Cancer Other     Social History   Tobacco  Use   Smoking status: Never   Smokeless tobacco: Never  Vaping Use   Vaping Use: Never used  Substance Use Topics   Alcohol use: Yes    Comment: social   Drug use: No    Home Medications Prior to Admission medications   Medication Sig Start Date End Date Taking? Authorizing Provider  AMBULATORY NON FORMULARY MEDICATION Single glucometer with lancets, test strips. Test daily 09/03/20   Rodolph Bong, MD  FLUoxetine (PROZAC) 20 MG capsule Take 1 capsule (20 mg total) by mouth daily. 09/29/20   Rodolph Bong, MD  ibuprofen (ADVIL) 800 MG tablet Take 1 tablet (800 mg total) by mouth every 8 (eight) hours as needed. 09/12/20   Elson Areas, PA-C  lidocaine (LIDODERM) 5 % Place 1 patch onto the skin daily. Remove & Discard patch within 12 hours or as directed by MD Patient not taking: Reported on 04/08/2021 04/18/20   Jeannie Fend, PA-C  meclizine (ANTIVERT) 12.5 MG tablet Take  1 tablet (12.5 mg total) by mouth 3 (three) times daily as needed for dizziness. Patient not taking: Reported on 10/29/2020 09/09/20   McDonald, Pedro Earls A, PA-C  meloxicam (MOBIC) 15 MG tablet Take 1 tablet (15 mg total) by mouth daily as needed (headache). Patient not taking: Reported on 10/29/2020 12/12/19   Rodolph Bong, MD  topiramate (TOPAMAX) 100 MG tablet Take 150 mg by mouth 2 (two) times daily.    [provider]  traZODone (DESYREL) 50 MG tablet Take 1 tablet (50 mg total) by mouth at bedtime. 06/22/21   Rodolph Bong, MD  zolpidem (AMBIEN) 5 MG tablet Take 1 tablet (5 mg total) by mouth at bedtime as needed for sleep. Patient not taking: Reported on 10/29/2020 12/25/19   Rodolph Bong, MD    Allergies    Patient has no known allergies.  Review of Systems   Review of Systems  All other systems reviewed and are negative.  Physical Exam Updated Vital Signs BP 127/82 (BP Location: Right Arm)    Pulse 81    Temp (!) 97.4 F (36.3 C) (Oral)    Resp 18    Ht 5\' 2"  (1.575 m)    Wt 78.9 kg    LMP 08/24/2021    SpO2 100%    BMI 31.83 kg/m   Physical Exam Vitals and nursing note reviewed.  Constitutional:      General: She is not in acute distress.    Appearance: Normal appearance. She is normal weight.  HENT:     Head: Normocephalic.  Eyes:     Pupils: Pupils are equal, round, and reactive to light.  Cardiovascular:     Rate and Rhythm: Normal rate.  Pulmonary:     Effort: Pulmonary effort is normal.  Musculoskeletal:        General: Tenderness present.     Left shoulder: Tenderness and bony tenderness present. Decreased range of motion.     Comments: Pain with passive and active left shoulder abduction/internal and external rotation.  Actively can not abduct more than 90 degrees due to pain.  2+ left radial pulse.  No deformity or significant clavicle tenderness.  Skin:    General: Skin is warm and dry.  Neurological:     Mental Status: She is alert and oriented to person,  place, and time. Mental status is at baseline.     Sensory: No sensory deficit.     Motor: No weakness.  Psychiatric:  Mood and Affect: Mood normal.        Behavior: Behavior normal.    ED Results / Procedures / Treatments   Labs (all labs ordered are listed, but only abnormal results are displayed) Labs Reviewed  PREGNANCY, URINE    EKG None  Radiology DG Shoulder Left Portable  Result Date: 08/24/2021 CLINICAL DATA:  Recent seizure activity with history of dislocation EXAM: LEFT SHOULDER COMPARISON:  None. FINDINGS: No acute fracture or dislocation is noted. No soft tissue abnormality is seen. IMPRESSION: No acute abnormality noted. Electronically Signed   By: Alcide Clever M.D.   On: 08/24/2021 23:23   DG Humerus Left  Result Date: 08/24/2021 CLINICAL DATA:  Recent seizure activity with arm pain, initial encounter EXAM: LEFT HUMERUS - 2+ VIEW COMPARISON:  None. FINDINGS: There is no evidence of fracture or other focal bone lesions. Soft tissues are unremarkable. IMPRESSION: No acute abnormality noted. Electronically Signed   By: Alcide Clever M.D.   On: 08/24/2021 23:24    Procedures Procedures   Medications Ordered in ED Medications - No data to display  ED Course  I have reviewed the triage vital signs and the nursing notes.  Pertinent labs & imaging results that were available during my care of the patient were reviewed by me and considered in my medical decision making (see chart for details).    MDM Rules/Calculators/A&P                         Right handed female with sz disorder here with left shoulder pain after a sz 5 days ago.  She has no deformity but limited ROM due to pain.  Concern for shoulder sprain/rotator cuff injury.  Lower suspicion for dislocation or clavicle fracture.  Plain films are wnl. Will given supportive care and f/u with ortho.  MDM   Amount and/or Complexity of Data Reviewed Tests in the radiology section of CPT: ordered and  reviewed Independent visualization of images, tracings, or specimens: yes       Final Clinical Impression(s) / ED Diagnoses Final diagnoses:  Sprain of left shoulder, unspecified shoulder sprain type, initial encounter    Rx / DC Orders ED Discharge Orders          Ordered    naproxen (EC NAPROSYN) 500 MG EC tablet  2 times daily with meals        08/24/21 2337             Gwyneth Sprout, MD 08/24/21 2337

## 2021-08-26 ENCOUNTER — Ambulatory Visit: Payer: Medicaid Other | Admitting: Sports Medicine

## 2021-08-26 VITALS — BP 112/59 | Ht 62.5 in | Wt 174.0 lb

## 2021-08-26 DIAGNOSIS — M25512 Pain in left shoulder: Secondary | ICD-10-CM
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
## 2021-08-26 NOTE — Patient Instructions (Addendum)
It was great to meet you!  Your shoulder pain is likely a muscle strain and inflammation related to your seizure. Your x-rays did not show any dislocation or fracture. We expect this to heal with anti-inflammatory medication, rest, and gentle rehab exercises.   Please continue taking the Naproxen that was prescribed for the next 1 week. You should do the shoulder exercises 2-3 times per day as tolerated. Wear the sling only as needed for comfort.  We will see you back in a few weeks to check in.  Take care, Dr. Anner Crete & Dr. Margaretha Sheffield

## 2021-09-02 ENCOUNTER — Ambulatory Visit: Payer: Medicaid Other

## 2021-09-09 ENCOUNTER — Encounter: Payer: Self-pay | Admitting: Student

## 2021-09-09 ENCOUNTER — Other Ambulatory Visit (HOSPITAL_COMMUNITY)
Admission: RE | Admit: 2021-09-09 | Discharge: 2021-09-09 | Disposition: A | Discharge status: Home / Self Care | Payer: Medicaid Other | Source: Ambulatory Visit | Attending: Family Medicine | Admitting: Family Medicine

## 2021-09-09 ENCOUNTER — Ambulatory Visit (INDEPENDENT_AMBULATORY_CARE_PROVIDER_SITE_OTHER): Payer: Medicaid Other

## 2021-09-09 ENCOUNTER — Other Ambulatory Visit: Payer: Self-pay

## 2021-09-09 VITALS — BP 124/74 | HR 76 | Wt 181.4 lb

## 2021-09-09 DIAGNOSIS — Z113 Encounter for screening for infections with a predominantly sexual mode of transmission: Secondary | ICD-10-CM

## 2021-09-09 NOTE — Progress Notes (Signed)
Here today requesting STD screening. Pt reports recent trip to [place] with partner. States she used a new soap while on this trip. Describes sore, stinging small white bumps on labia directly after trip. Picture sent via MyChart reviewed with Alysia Penna, MD who states this does not appear to be a sexually transmitted infection. Self swab instructions given and specimen obtained. Explained to pt we will contact her with any abnormal results.  Fleet Contras RN 09/09/21

## 2021-09-10 LAB — CERVICOVAGINAL ANCILLARY ONLY
Chlamydia: NEGATIVE
Comment: NEGATIVE
Comment: NEGATIVE
Comment: NORMAL
Neisseria Gonorrhea: NEGATIVE
Trichomonas: NEGATIVE

## 2021-09-16 ENCOUNTER — Ambulatory Visit (INDEPENDENT_AMBULATORY_CARE_PROVIDER_SITE_OTHER): Payer: Medicaid Other | Admitting: Sports Medicine

## 2021-09-16 VITALS — BP 110/74 | Ht 62.0 in | Wt 176.0 lb

## 2021-09-16 DIAGNOSIS — M25512 Pain in left shoulder: Secondary | ICD-10-CM

## 2021-09-16 NOTE — Patient Instructions (Addendum)
It was great seeing you today!  Today we discussed your left shoulder pain, I am sorry that this is still painful. Since you are having ongoing pain, we want to do further imaging. We will get an MRI arthrogram which will allow [place] to get a better look at the ligaments and tendons of your shoulder.   We will be in touch to follow up shortly after this imaging is completed to discuss next steps.    Thank you for allowing [place] to be a part of your medical care!  Take care, Dr. Robyne Peers and Dr. Margaretha Sheffield

## 2021-09-16 NOTE — Progress Notes (Signed)
° °  Subjective:    Patient ID: Olivia Kelly, female    DOB: 1986/02/27, 36 y.o.   MRN: [id number]  HPI Patient presents for follow up after left shoulder pain as a result of a recent seizure. States that overall, her pain is better than her last visit but movement makes it worse and elicits pain. Pain occurs randomly usually when she is lifting her arm up, laying on her side and lifting boxes at work. Pain never occurs at rest and at these times she denies any pain while with most movements she endorses pain of severe intensity as occurred initially after the incident. Most recently, she was going to reach a soda can in the grocery store which resulted in pain and she had to drop her entire left arm until the pain subsided. Naproxen helps resolve the pain to the point that any range of motion does not bother her.    Review of Systems As above    Objective:   Physical Exam General: Patient well-appearing, in no acute distress. MSK:  Inspection reveals no swelling, ecchymosis, edema or gross deformity of left shoulder joint. Palpation reveals mild point tenderness directly along posterior shoulder.  ROM: limited active ROM along left shoulder with abduction and external rotation but normal passive ROM noted  Neuro: gross sensation intact along UE bilaterally, 5/5 grip strength, 5/5 right UE strength and 4/5 left UE strength indicating arm weakness, 2+ brachioradialis and biceps tendon reflex bilaterally  Special testing: negative Hawkin's and Neer testing, negative Yergasons and Speeds, negative lift off testing bilaterally      Assessment & Plan:  Acute left shoulder pain: Recent radiographic imaging notable for no fracture or dislocation but given ongoing severity, concerned for possible labral tear. Will plan to obtain MRI arthrogram and follow up soon after to discuss remainder of treatment plan.    Patient seen and evaluated with the resident.  I agree with the above plan of care.   Pain all began in this left shoulder after a recent seizure.  X-rays were unremarkable but she continues to have pain.  I recommend an MRI to rule out both a labral tear and a rotator cuff tear that may have been injured at the time of her seizure.  Phone follow-up with those results when available.  We will delineate further treatment based on those findings.

## 2021-09-17 ENCOUNTER — Encounter: Payer: Self-pay | Admitting: Sports Medicine

## 2021-09-22 ENCOUNTER — Other Ambulatory Visit: Payer: Self-pay

## 2021-09-22 DIAGNOSIS — M25512 Pain in left shoulder: Secondary | ICD-10-CM

## 2021-09-28 ENCOUNTER — Ambulatory Visit (HOSPITAL_COMMUNITY): Admission: RE | Admit: 2021-09-28 | Payer: Medicaid Other | Source: Ambulatory Visit

## 2021-09-28 ENCOUNTER — Encounter: Payer: Self-pay | Admitting: Sports Medicine

## 2021-10-04 ENCOUNTER — Other Ambulatory Visit: Payer: Medicaid Other

## 2021-10-11 ENCOUNTER — Other Ambulatory Visit (HOSPITAL_COMMUNITY): Payer: Self-pay | Admitting: Physician Assistant

## 2021-10-11 ENCOUNTER — Other Ambulatory Visit: Payer: Self-pay

## 2021-10-11 ENCOUNTER — Ambulatory Visit (HOSPITAL_COMMUNITY)
Admission: RE | Admit: 2021-10-11 | Discharge: 2021-10-11 | Disposition: A | Discharge status: Home / Self Care | Payer: Medicaid Other | Source: Ambulatory Visit | Attending: Sports Medicine | Admitting: Sports Medicine

## 2021-10-11 DIAGNOSIS — M25512 Pain in left shoulder: Secondary | ICD-10-CM | POA: Diagnosis not present

## 2021-10-11 DIAGNOSIS — S43432A Superior glenoid labrum lesion of left shoulder, initial encounter: Secondary | ICD-10-CM | POA: Diagnosis not present

## 2021-10-11 DIAGNOSIS — X58XXXA Exposure to other specified factors, initial encounter: Secondary | ICD-10-CM | POA: Diagnosis not present

## 2021-10-11 MED ORDER — GADOBUTROL 1 MMOL/ML IV SOLN
0.0500 mL | Freq: Once | INTRAVENOUS | Status: AC | PRN
  Administered 2021-10-11: 0.05 mL

## 2021-10-11 MED ORDER — IOHEXOL 180 MG/ML  SOLN
10.0000 mL | Freq: Once | INTRAMUSCULAR | Status: AC | PRN
Start: 2021-10-11 — End: 2021-10-11
  Administered 2021-10-11: 10 mL via INTRA_ARTICULAR

## 2021-10-11 MED ORDER — SODIUM CHLORIDE (PF) 0.9 % IJ SOLN
10.0000 mL | Freq: Once | INTRAMUSCULAR | Status: AC
  Administered 2021-10-11: 10 mL

## 2021-10-11 MED ORDER — LIDOCAINE HCL (PF) 1 % IJ SOLN
5.0000 mL | Freq: Once | INTRAMUSCULAR | Status: AC
  Administered 2021-10-11: 5 mL via INTRADERMAL

## 2021-10-13 ENCOUNTER — Telehealth: Payer: Self-pay | Admitting: Sports Medicine

## 2021-10-13 ENCOUNTER — Encounter: Payer: Self-pay | Admitting: Sports Medicine

## 2021-10-13 NOTE — Telephone Encounter (Signed)
°  I spoke with Olivia Kelly on the phone today after reviewing MRI arthrogram findings of her left shoulder.  She does have findings consistent with a previous anterior shoulder dislocation as well as a superior labral tear from the anterior to posterior position from 11:00 to 2:00.  Based on these findings I recommend consultation with orthopedic surgery to discuss treatment options.  She is in agreement with that plan.  I will defer further treatment to the discretion of the orthopedic surgeons and she will follow-up with me as needed.

## 2021-11-12 ENCOUNTER — Emergency Department (HOSPITAL_BASED_OUTPATIENT_CLINIC_OR_DEPARTMENT_OTHER)
Admission: EM | Admit: 2021-11-12 | Discharge: 2021-11-12 | Discharge status: Against Medical Advice | Payer: Medicaid Other | Source: Home / Self Care

## 2021-11-12 ENCOUNTER — Other Ambulatory Visit: Payer: Self-pay

## 2021-11-23 ENCOUNTER — Other Ambulatory Visit: Payer: Self-pay

## 2021-11-23 ENCOUNTER — Encounter (HOSPITAL_BASED_OUTPATIENT_CLINIC_OR_DEPARTMENT_OTHER): Payer: Self-pay | Admitting: Orthopaedic Surgery

## 2021-12-01 NOTE — H&P (Signed)
? ? ?PREOPERATIVE H&P ? ?Chief Complaint: LEFT SHOULDER CARTLIAGE DISORDER, ROTATOR CUFF TEAR, SUBLUXATION ? ?HPI: ?Olivia Kelly is a 36 y.o. female who is scheduled for, Procedure(s): ?ARTHROSCOPY SHOULDER/DEBRIDEMENT and ROTATOR CUFF REPAIR withBANKART REPAIR.  ? ?Patient has a past medical history significant for seizures - well controlled with medication.  ? ?The patient is a 36 year old who has a history of seizure disorder who had a seizure on 08-19-2021.  She had pain in her shoulder.  She does not have much in the way of details as she was status post seizure.  She has been followed for shoulder pain and concern for instability.  She had an MRI and was sent to Dr. Griffin Basil for further evaluation.  ? ?Symptoms are rated as moderate to severe, and have been worsening.  This is significantly impairing activities of daily living.   ? ?Please see clinic note for further details on this patient's care.   ? ?She has elected for surgical management.  ? ?Past Medical History:  ?Diagnosis Date  ? Anemia due to blood loss, acute 04/03/2016  ? History of COVID-19 12/12/2019  ? Seizures ([place])   ? last seizure 2015, on meds  ? ?Past Surgical History:  ?Procedure Laterality Date  ? cyst removed middle of chest at 55-80 years old    ? CYSTOSCOPY WITH RETROGRADE PYELOGRAM, URETEROSCOPY AND STENT PLACEMENT Left 10/29/2014  ? Procedure: CYSTOSCOPY WITH LEFT RETROGRADE PYELOGRAM,  DIAGNOSTIC LEFT  URETEROSCOPY AND STENT PLACEMENT;  Surgeon: Alexis Frock, MD;  Location: [facility];  Service: Urology;  Laterality: Left;  ? CYSTOSCOPY WITH RETROGRADE PYELOGRAM, URETEROSCOPY AND STENT PLACEMENT Left 01/23/2015  ? Procedure: CYSTOSCOPY WITH LEFT RETROGRADE PYELOGRAM/URETERAL STENT PLACEMENT;  Surgeon: Alexis Frock, MD;  Location: [facility];  Service: Urology;  Laterality: Left;  ? IUD REMOVAL N/A 06/09/2015  ? Procedure: INTRAUTERINE DEVICE (IUD) REMOVAL;  Surgeon: Emily Filbert, MD;  Location: [facility];  Service:  Gynecology;  Laterality: N/A;  ? NEPHRECTOMY    ? half of left kidney removed  ? ROBOT ASSISTED PYELOPLASTY Left 01/23/2015  ? Procedure: ROBOTIC ASSISTED PYELOPLASTY WITH STENT PLACEMENT;  Surgeon: Alexis Frock, MD;  Location: [facility];  Service: Urology;  Laterality: Left;  ? ?Social History  ? ?Socioeconomic History  ? Marital status: Single  ?  Spouse name: Not on file  ? Number of children: Not on file  ? Years of education: Not on file  ? Highest education level: Not on file  ?Occupational History  ? Not on file  ?Tobacco Use  ? Smoking status: Never  ? Smokeless tobacco: Never  ?Vaping Use  ? Vaping Use: Never used  ?Substance and Sexual Activity  ? Alcohol use: Yes  ?  Comment: social  ? Drug use: No  ? Sexual activity: Yes  ?  Birth control/protection: None  ?  Comment: Mirena IUD in place  ?Other Topics Concern  ? Not on file  ?Social History Narrative  ? Not on file  ? ?Social Determinants of Health  ? ?Financial Resource Strain: Not on file  ?Food Insecurity: Food Insecurity Present  ? Worried About Charity fundraiser in the Last Year: Sometimes true  ? Ran Out of Food in the Last Year: Sometimes true  ?Transportation Needs: No Transportation Needs  ? Lack of Transportation (Medical): No  ? Lack of Transportation (Non-Medical): No  ?Physical Activity: Not on file  ?Stress: Not on file  ?Social Connections: Not on file  ? ?Family History  ?  Problem Relation Age of Onset  ? Diabetes Mother   ? Hypertension Mother   ? Diabetes Father   ? Kidney failure Father   ? Cancer Other   ? ?No Known Allergies ?Prior to Admission medications   ?Medication Sig Start Date End Date Taking? Authorizing Provider  ?Lacosamide 150 MG TABS Take by mouth. 08/09/21  Yes [provider]  ?topiramate (TOPAMAX) 100 MG tablet Take 150 mg by mouth 2 (two) times daily.   Yes [provider]  ?traZODone (DESYREL) 50 MG tablet Take 1 tablet (50 mg total) by mouth at bedtime. 06/22/21  Yes Gregor Hams, MD  ?AMBULATORY  NON FORMULARY MEDICATION Single glucometer with lancets, test strips. Test daily 09/03/20   Gregor Hams, MD  ?FLUoxetine (PROZAC) 20 MG capsule Take 1 capsule (20 mg total) by mouth daily. 09/29/20   Gregor Hams, MD  ?ibuprofen (ADVIL) 800 MG tablet Take 1 tablet (800 mg total) by mouth every 8 (eight) hours as needed. 09/12/20   Fransico Meadow, PA-C  ?lidocaine (LIDODERM) 5 % Place 1 patch onto the skin daily. Remove & Discard patch within 12 hours or as directed by MD ?Patient not taking: Reported on 04/08/2021 04/18/20   Suella Broad A, PA-C  ?meloxicam (MOBIC) 15 MG tablet Take 1 tablet (15 mg total) by mouth daily as needed (headache). ?Patient not taking: Reported on 09/09/2021 12/12/19   Gregor Hams, MD  ? ? ?ROS: All other systems have been reviewed and were otherwise negative with the exception of those mentioned in the HPI and as above. ? ?Physical Exam: ?General: Alert, no acute distress ?Cardiovascular: No pedal edema ?Respiratory: No cyanosis, no use of accessory musculature ?GI: No organomegaly, abdomen is soft and non-tender ?Skin: No lesions in the area of chief complaint ?Neurologic: Sensation intact distally ?Psychiatric: Patient is competent for consent with normal mood and affect ?Lymphatic: No axillary or cervical lymphadenopathy ? ?MUSCULOSKELETAL:  ?Range of motion of the shoulder is significantly limited secondary to apprehension.  She is able to get her shoulder in forward flexion to 170 degrees, but she has significant apprehension in the abduction external rotation position.  Cuff strength is intact.  Axillary nerve appears to be firing. ? ?Imaging: ?MRI reviewed demonstrates a significant Hill-Sachs lesion that is engaging based on its position and size.  She has a large anterior inferior labral tear as well as a SLAP tear. ? ?Assessment: ?LEFT SHOULDER CARTLIAGE DISORDER, ROTATOR CUFF TEAR, SUBLUXATION ? ?Plan: ?Plan for Procedure(s): ?ARTHROSCOPY SHOULDER/DEBRIDEMENT and ROTATOR CUFF  REPAIR withBANKART REPAIR ? ?The risks benefits and alternatives were discussed with the patient including but not limited to the risks of nonoperative treatment, versus surgical intervention including infection, bleeding, nerve injury,  blood clots, cardiopulmonary complications, morbidity, mortality, among others, and they were willing to proceed.  ? ?The patient acknowledged the explanation, agreed to proceed with the plan and consent was signed.  ? ?Operative Plan: Left shoulder scope with Bankart repair, extensive debridement, possible SLAP repair and remplissage  ?Discharge Medications: Standard ?DVT Prophylaxis: None ?Physical Therapy: Outpatient PT ?Special Discharge needs: Sling - external rotation sling. IceMan ? ? ?Ethelda Chick, PA-C ? ?12/01/2021 ?3:11 PM ? ?

## 2021-12-01 NOTE — Discharge Instructions (Addendum)
?Post Anesthesia Home Care Instructions ? ?Activity: ?Get plenty of rest for the remainder of the day. A responsible individual must stay with you for 24 hours following the procedure.  ?For the next 24 hours, DO NOT: ?-Drive a car ?-Paediatric nurse ?-Drink alcoholic beverages ?-Take any medication unless instructed by your physician ?-Make any legal decisions or sign important papers. ? ?Meals: ?Start with liquid foods such as gelatin or soup. Progress to regular foods as tolerated. Avoid greasy, spicy, heavy foods. If nausea and/or vomiting occur, drink only clear liquids until the nausea and/or vomiting subsides. Call your physician if vomiting continues. ? ?Special Instructions/Symptoms: ?Your throat may feel dry or sore from the anesthesia or the breathing tube placed in your throat during surgery. If this causes discomfort, gargle with warm salt water. The discomfort should disappear within 24 hours. ? ? ? Regional Anesthesia Blocks ? ?1. Numbness or the inability to move the "blocked" extremity may last from 3-48 hours after placement. The length of time depends on the medication injected and your individual response to the medication. If the numbness is not going away after 48 hours, call your surgeon. ? ?2. The extremity that is blocked will need to be protected until the numbness is gone and the  Strength has returned. Because you cannot feel it, you will need to take extra care to avoid injury. Because it may be weak, you may have difficulty moving it or using it. You may not know what position it is in without looking at it while the block is in effect. ? ?3. For blocks in the legs and feet, returning to weight bearing and walking needs to be done carefully. You will need to wait until the numbness is entirely gone and the strength has returned. You should be able to move your leg and foot normally before you try and bear weight or walk. You will need someone to be with you when you first try to  ensure you do not fall and possibly risk injury. ? ?4. Bruising and tenderness at the needle site are common side effects and will resolve in a few days. ? ?5. Persistent numbness or new problems with movement should be communicated to the surgeon or the [facility] [phone] [place] [phone]).  ? ? ? ?Ophelia Charter MD, MPH ?Noemi Chapel, PA-C ?[facility] ?[address], [address] ?[phone] (tel)   ?[phone] (fax) ? ? ?POST-OPERATIVE INSTRUCTIONS - SHOULDER ARTHROSCOPY ? ?WOUND CARE ?You may remove the Operative Dressing on Post-Op Day #3 (72hrs after surgery).   ?Alternatively if you would like you can leave dressing on until follow-up if within 7-8 days but keep it dry. ?Leave steri-strips in place until they fall off on their own, usually 2 weeks postop. ?There may be a small amount of fluid/bleeding leaking at the surgical site.  ?This is normal; the shoulder is filled with fluid during the procedure and can leak for 24-48hrs after surgery.  ?You may change/reinforce the bandage as needed.  ?Use the Cryocuff or Ice as often as possible for the first 7 days, then as needed for pain relief. Always keep a towel, ACE wrap or other barrier between the cooling unit and your skin.  ?You may shower on Post-Op Day #3. Gently pat the area dry. Do not soak the shoulder in water or submerge it. Keep incisions as dry as possible. ?Do not go swimming in the pool or ocean until 4 weeks after surgery  or when otherwise instructed.   ? ?EXERCISES/BRACING ?Sling should be used at all times until follow-up.  ?You can remove sling for hygiene ONLY.    ?Please continue to ambulate and do not stay sitting or lying for too long. Perform foot and wrist pumps to assist in circulation. ? ?POST-OP MEDICATIONS- Multimodal approach to pain control ?In general your pain will be controlled with a combination of substances.  Prescriptions unless otherwise discussed are  electronically sent to your pharmacy.  This is a carefully made plan we use to minimize narcotic use.    ? ?Celebrex - Anti-inflammatory medication taken on a scheduled basis ?Acetaminophen - Non-narcotic pain medicine taken on a scheduled basis  ?Oxycodone - This is a strong narcotic, to be used only on an ?as needed? basis for SEVERE pain. ?Zofran - take as needed for nausea ? ?FOLLOW-UP ?If you develop a Fever (?101.5), Redness or Drainage from the surgical incision site, please call our office to arrange for an evaluation. ?Please call the office to schedule a follow-up appointment, 10-14 days post-operatively. ? ? ? ?HELPFUL INFORMATION ? ?If you had a block, it will wear off between 8-24 hrs postop typically.  This is period when your pain may go from nearly zero to the pain you would have had postop without the block.  This is an abrupt transition but nothing dangerous is happening.  You may take an extra dose of narcotic when this happens. ? ?You may be more comfortable sleeping in a semi-seated position the first few nights following surgery.  Keep a pillow propped under the elbow and forearm for comfort.  If you have a recliner type of chair it might be beneficial.  If not that is fine too, but it would be helpful to sleep propped up with pillows behind your operated shoulder as well under your elbow and forearm.  This will reduce pulling on the suture lines. ? ?When dressing, put your operative arm in the sleeve first.  When getting undressed, take your operative arm out last.  Loose fitting, button-down shirts are recommended.  Often in the first days after surgery you may be more comfortable keeping your operative arm under your shirt and not through the sleeve. ? ?You may return to work/school in the next couple of days when you feel up to it.  Desk work and typing in the sling is fine. ? ?We suggest you use the pain medication the first night prior to going to bed, in order to ease any pain when the  anesthesia wears off. You should avoid taking pain medications on an empty stomach as it will make you nauseous. ? ?You should wean off your narcotic medicines as soon as you are able.  Most patients will be off or using minimal narcotics before their first postop appointment.  ? ?Do not drink alcoholic beverages or take illicit drugs when taking pain medications. ? ?It is against the law to drive while taking narcotics.  In some states it is against the law to drive while your arm is in a sling.  ? ?Pain medication may make you constipated.  Below are a few solutions to try in this order: ?Decrease the amount of pain medication if you aren't having pain. ?Drink lots of decaffeinated fluids. ?Drink prune juice and/or eat dried prunes ? ?If the first 3 don't work start with additional solutions ?Take Colace - an over-the-counter stool softener ?Take Senokot - an over-the-counter laxative ?Take Miralax - a stronger over-the-counter laxative ? ?  For more information including helpful videos and documents visit our website:  ? ?[email or web address] ? ? ? ?

## 2021-12-02 ENCOUNTER — Ambulatory Visit (HOSPITAL_BASED_OUTPATIENT_CLINIC_OR_DEPARTMENT_OTHER): Payer: Medicaid Other | Admitting: Certified Registered"

## 2021-12-02 ENCOUNTER — Other Ambulatory Visit: Payer: Self-pay

## 2021-12-02 ENCOUNTER — Encounter (HOSPITAL_BASED_OUTPATIENT_CLINIC_OR_DEPARTMENT_OTHER): Payer: Self-pay | Admitting: Orthopaedic Surgery

## 2021-12-02 ENCOUNTER — Encounter (HOSPITAL_BASED_OUTPATIENT_CLINIC_OR_DEPARTMENT_OTHER)
Admission: RE | Disposition: A | Discharge status: Home / Self Care | Payer: Self-pay | Source: Home / Self Care | Attending: Orthopaedic Surgery

## 2021-12-02 ENCOUNTER — Ambulatory Visit (HOSPITAL_BASED_OUTPATIENT_CLINIC_OR_DEPARTMENT_OTHER)
Admission: RE | Admit: 2021-12-02 | Discharge: 2021-12-02 | Disposition: A | Discharge status: Home / Self Care | Payer: Medicaid Other | Attending: Orthopaedic Surgery | Admitting: Orthopaedic Surgery

## 2021-12-02 DIAGNOSIS — M25312 Other instability, left shoulder: Secondary | ICD-10-CM | POA: Insufficient documentation

## 2021-12-02 DIAGNOSIS — S43432A Superior glenoid labrum lesion of left shoulder, initial encounter: Secondary | ICD-10-CM | POA: Insufficient documentation

## 2021-12-02 DIAGNOSIS — G40909 Epilepsy, unspecified, not intractable, without status epilepticus: Secondary | ICD-10-CM | POA: Insufficient documentation

## 2021-12-02 DIAGNOSIS — X58XXXA Exposure to other specified factors, initial encounter: Secondary | ICD-10-CM | POA: Diagnosis not present

## 2021-12-02 DIAGNOSIS — F419 Anxiety disorder, unspecified: Secondary | ICD-10-CM | POA: Insufficient documentation

## 2021-12-02 HISTORY — PX: SHOULDER ARTHROSCOPY WITH BANKART REPAIR: SHX5673

## 2021-12-02 LAB — POCT PREGNANCY, URINE: Preg Test, Ur: NEGATIVE

## 2021-12-02 SURGERY — SHOULDER ARTHROSCOPY WITH BANKART REPAIR
Anesthesia: General | Site: Shoulder | Laterality: Left

## 2021-12-02 MED ORDER — AMISULPRIDE (ANTIEMETIC) 5 MG/2ML IV SOLN
10.0000 mg | Freq: Once | INTRAVENOUS | Status: AC
  Administered 2021-12-02: 10 mg via INTRAVENOUS

## 2021-12-02 MED ORDER — ROCURONIUM BROMIDE 100 MG/10ML IV SOLN
INTRAVENOUS | Status: DC | PRN
  Administered 2021-12-02: 70 mg via INTRAVENOUS

## 2021-12-02 MED ORDER — AMISULPRIDE (ANTIEMETIC) 5 MG/2ML IV SOLN
INTRAVENOUS | Status: AC
  Filled 2021-12-02: qty 4

## 2021-12-02 MED ORDER — MIDAZOLAM HCL 2 MG/2ML IJ SOLN
2.0000 mg | Freq: Once | INTRAMUSCULAR | Status: AC
  Administered 2021-12-02: 2 mg via INTRAVENOUS

## 2021-12-02 MED ORDER — OXYCODONE HCL 5 MG PO TABS: 5.0000 mg | ORAL_TABLET | Freq: Once | ORAL | Status: DC | PRN

## 2021-12-02 MED ORDER — OXYCODONE HCL 5 MG/5ML PO SOLN: 5.0000 mg | Freq: Once | ORAL | Status: DC | PRN

## 2021-12-02 MED ORDER — SODIUM CHLORIDE 0.9 % IR SOLN
Status: DC | PRN
  Administered 2021-12-02: 8000 mL

## 2021-12-02 MED ORDER — CEFAZOLIN SODIUM-DEXTROSE 2-4 GM/100ML-% IV SOLN
2.0000 g | INTRAVENOUS | Status: AC
  Administered 2021-12-02: 2 g via INTRAVENOUS

## 2021-12-02 MED ORDER — FENTANYL CITRATE (PF) 100 MCG/2ML IJ SOLN
INTRAMUSCULAR | Status: AC
  Filled 2021-12-02: qty 2

## 2021-12-02 MED ORDER — SUGAMMADEX SODIUM 200 MG/2ML IV SOLN
INTRAVENOUS | Status: DC | PRN
  Administered 2021-12-02: 200 mg via INTRAVENOUS

## 2021-12-02 MED ORDER — MIDAZOLAM HCL 2 MG/2ML IJ SOLN
INTRAMUSCULAR | Status: AC
  Filled 2021-12-02: qty 2

## 2021-12-02 MED ORDER — CELECOXIB 100 MG PO CAPS: 100.0000 mg | ORAL_CAPSULE | Freq: Two times a day (BID) | ORAL | 0 refills | Status: AC

## 2021-12-02 MED ORDER — ONDANSETRON HCL 4 MG/2ML IJ SOLN: 4.0000 mg | Freq: Four times a day (QID) | INTRAMUSCULAR | Status: DC | PRN

## 2021-12-02 MED ORDER — FENTANYL CITRATE (PF) 100 MCG/2ML IJ SOLN
INTRAMUSCULAR | Status: DC | PRN
  Administered 2021-12-02: 25 ug via INTRAVENOUS

## 2021-12-02 MED ORDER — LACTATED RINGERS IV SOLN: INTRAVENOUS | Status: DC

## 2021-12-02 MED ORDER — DEXAMETHASONE SODIUM PHOSPHATE 4 MG/ML IJ SOLN
INTRAMUSCULAR | Status: DC | PRN
Start: 2021-12-02 — End: 2021-12-02
  Administered 2021-12-02: 4 mg via INTRAVENOUS

## 2021-12-02 MED ORDER — ACETAMINOPHEN 500 MG PO TABS: 1000.0000 mg | ORAL_TABLET | Freq: Three times a day (TID) | ORAL | 0 refills | Status: AC

## 2021-12-02 MED ORDER — FENTANYL CITRATE (PF) 100 MCG/2ML IJ SOLN
100.0000 ug | Freq: Once | INTRAMUSCULAR | Status: AC
  Administered 2021-12-02: 50 ug via INTRAVENOUS

## 2021-12-02 MED ORDER — ONDANSETRON HCL 4 MG PO TABS: 4.0000 mg | ORAL_TABLET | Freq: Three times a day (TID) | ORAL | 0 refills | Status: AC | PRN

## 2021-12-02 MED ORDER — OXYCODONE HCL 5 MG PO TABS: ORAL_TABLET | ORAL | 0 refills | Status: AC

## 2021-12-02 MED ORDER — PROPOFOL 10 MG/ML IV BOLUS
INTRAVENOUS | Status: DC | PRN
  Administered 2021-12-02: 200 mg via INTRAVENOUS

## 2021-12-02 MED ORDER — LIDOCAINE HCL (CARDIAC) PF 100 MG/5ML IV SOSY
PREFILLED_SYRINGE | INTRAVENOUS | Status: DC | PRN
  Administered 2021-12-02: 60 mg via INTRAVENOUS

## 2021-12-02 MED ORDER — FENTANYL CITRATE (PF) 100 MCG/2ML IJ SOLN: 25.0000 ug | INTRAMUSCULAR | Status: DC | PRN

## 2021-12-02 MED ORDER — CEFAZOLIN SODIUM-DEXTROSE 2-4 GM/100ML-% IV SOLN
INTRAVENOUS | Status: AC
  Filled 2021-12-02: qty 100

## 2021-12-02 MED ORDER — ONDANSETRON HCL 4 MG/2ML IJ SOLN
INTRAMUSCULAR | Status: DC | PRN
  Administered 2021-12-02: 4 mg via INTRAVENOUS

## 2021-12-02 MED ORDER — BUPIVACAINE-EPINEPHRINE (PF) 0.5% -1:200000 IJ SOLN
INTRAMUSCULAR | Status: DC | PRN
  Administered 2021-12-02: 30 mL via PERINEURAL

## 2021-12-02 SURGICAL SUPPLY — 61 items
ANCH SUT 2 FBRTK KNTLS 1.8 (Anchor) ×12 IMPLANT
ANCHOR SUT 1.8 FIBERTAK SB KL (Anchor) ×12 IMPLANT
APL PRP STRL LF DISP 70% ISPRP (MISCELLANEOUS) ×2
BLADE EXCALIBUR 4.0X13 (MISCELLANEOUS) ×3 IMPLANT
BNDG COHESIVE 4X5 TAN ST LF (GAUZE/BANDAGES/DRESSINGS) IMPLANT
BUR SURG 4D 13L RD FLUTE (BUR) ×2 IMPLANT
BURR SURG 4D 13L RD FLUTE (BUR) ×3
CANNULA 5.75X71 LONG (CANNULA) ×4 IMPLANT
CANNULA TWIST IN 8.25X7CM (CANNULA) ×4 IMPLANT
CHLORAPREP W/TINT 26 (MISCELLANEOUS) ×3 IMPLANT
COOLER ICEMAN CLASSIC (MISCELLANEOUS) ×3 IMPLANT
DISSECTOR  3.8MM X 13CM (MISCELLANEOUS) ×3
DISSECTOR 3.5MM X 13CM CVD (MISCELLANEOUS) IMPLANT
DISSECTOR 3.8MM X 13CM (MISCELLANEOUS) ×1 IMPLANT
DISSECTOR 4.0MMX13CM CVD (MISCELLANEOUS) IMPLANT
DRAPE IMP U-DRAPE 54X76 (DRAPES) ×3 IMPLANT
DRAPE INCISE IOBAN 66X45 STRL (DRAPES) IMPLANT
DRAPE SHOULDER BEACH CHAIR (DRAPES) ×3 IMPLANT
DRSG PAD ABDOMINAL 8X10 ST (GAUZE/BANDAGES/DRESSINGS) ×3 IMPLANT
DW OUTFLOW CASSETTE/TUBE SET (MISCELLANEOUS) ×3 IMPLANT
GAUZE SPONGE 4X4 12PLY STRL (GAUZE/BANDAGES/DRESSINGS) ×3 IMPLANT
GLOVE SRG 8 PF TXTR STRL LF DI (GLOVE) ×2 IMPLANT
GLOVE SURG ENC MOIS LTX SZ6.5 (GLOVE) ×3 IMPLANT
GLOVE SURG LTX SZ8 (GLOVE) ×3 IMPLANT
GLOVE SURG POLYISO LF SZ7 (GLOVE) ×2 IMPLANT
GLOVE SURG UNDER POLY LF SZ6.5 (GLOVE) ×3 IMPLANT
GLOVE SURG UNDER POLY LF SZ7 (GLOVE) ×4 IMPLANT
GLOVE SURG UNDER POLY LF SZ8 (GLOVE) ×3
GOWN STRL REUS W/ TWL LRG LVL3 (GOWN DISPOSABLE) ×4 IMPLANT
GOWN STRL REUS W/TWL LRG LVL3 (GOWN DISPOSABLE) ×6
GOWN STRL REUS W/TWL XL LVL3 (GOWN DISPOSABLE) ×3 IMPLANT
KIT PERC INSERT 3.0 KNTLS (KITS) ×2 IMPLANT
KIT STR SPEAR 1.8 FBRTK DISP (KITS) ×2 IMPLANT
LASSO 90 CVE QUICKPAS (DISPOSABLE) ×2 IMPLANT
LASSO CRESCENT QUICKPASS (SUTURE) ×2 IMPLANT
MANIFOLD NEPTUNE II (INSTRUMENTS) ×3 IMPLANT
NDL SAFETY ECLIPSE 18X1.5 (NEEDLE) ×2 IMPLANT
NEEDLE HYPO 18GX1.5 SHARP (NEEDLE) ×3
PACK ARTHROSCOPY DSU (CUSTOM PROCEDURE TRAY) ×3 IMPLANT
PACK BASIN DAY SURGERY FS (CUSTOM PROCEDURE TRAY) ×3 IMPLANT
PAD COLD SHLDR WRAP-ON (PAD) ×3 IMPLANT
PAD ORTHO SHOULDER 7X19 LRG (SOFTGOODS) IMPLANT
PORT APPOLLO RF 90DEGREE MULTI (SURGICAL WAND) IMPLANT
SHEET MEDIUM DRAPE 40X70 STRL (DRAPES) IMPLANT
SLEEVE ARM SUSPENSION SYSTEM (MISCELLANEOUS) IMPLANT
SLEEVE SCD COMPRESS KNEE MED (STOCKING) ×3 IMPLANT
SLING S3 LATERAL DISP (MISCELLANEOUS) IMPLANT
SLING ULTRA II SMALL (SOFTGOODS) ×2 IMPLANT
SPIKE FLUID TRANSFER (MISCELLANEOUS) IMPLANT
STRIP CLOSURE SKIN 1/2X4 (GAUZE/BANDAGES/DRESSINGS) ×3 IMPLANT
SUT FIBERWIRE #2 38 T-5 BLUE (SUTURE)
SUT MNCRL AB 4-0 PS2 18 (SUTURE) ×3 IMPLANT
SUT TIGER TAPE 7 IN WHITE (SUTURE) IMPLANT
SUTURE FIBERWR #2 38 T-5 BLUE (SUTURE) IMPLANT
SUTURE TAPE TIGERLINK 1.3MM BL (SUTURE) IMPLANT
SUTURETAPE TIGERLINK 1.3MM BL (SUTURE)
SYR 5ML LL (SYRINGE) ×3 IMPLANT
TAPE FIBER 2MM 7IN #2 BLUE (SUTURE) IMPLANT
TOWEL GREEN STERILE FF (TOWEL DISPOSABLE) ×3 IMPLANT
TUBE CONNECTING 20X1/4 (TUBING) IMPLANT
TUBING ARTHROSCOPY IRRIG 16FT (MISCELLANEOUS) ×3 IMPLANT

## 2021-12-02 NOTE — Interval H&P Note (Signed)
All questions answered

## 2021-12-02 NOTE — Progress Notes (Signed)
Assisted Dr. Hodierne with left, interscalene , ultrasound guided block. Side rails up, monitors on throughout procedure. See vital signs in flow sheet. Tolerated Procedure well. 

## 2021-12-02 NOTE — Op Note (Signed)
Orthopaedic Surgery Operative Note (CSN: [id number]) ? ?Olivia Kelly  [birth year] ?Date of Surgery: 12/02/2021 ? ? 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 ?Procedure: ?Arthroscopic extensive debridement - Debrided areas: Bone, cartilage, capsule ?Arthroscopic labral repair and capsulorrhaphy ?Arthroscopic SLAP repair ?  ?Operative Finding ?Exam under anesthesia: Clear anterior instability with dislocatable joint ?Patient had tear of the labrum from the 9:00 to 7 o'clock position with medialization compared to the face of the glenoid.  The tissue quality was extremely poor.  The Hill-Sachs posteriorly was not engaging even with maximal external rotation and we decided to leave it as it was.  We were prepared for a remplissage but this was unnecessary. ? ?Successful completion of the planned procedure.  Patient is extremely high risk of recurrent instability due to the sulcus sign and the poor thin tissue quality.  If she fails she may be a later jet candidate. ? ?Her insurance status makes it difficult to do therapy and we will weigh this with her tissue quality.  We think 4 weeks to start would be appropriate. ? ? ?Post-operative plan: The patient will be non-weightbearing in a sling for 6 weeks with PT to start after 4 weeks.  The patient will be discharged home.  DVT prophylaxis not indicated in ambulatory upper extremity patient without known risk factors.   Pain control with PRN pain medication preferring oral medicines.  Follow up plan will be scheduled in approximately 7 days for incision check. ? ?Post-Op Diagnosis: Same ?Surgeons:Primary: Hiram Gash, MD ?Assistants:Caroline McBane PA-C ?Location: [facility] OR ROOM 5 ?Anesthesia: General with Exparel interscalene block ?Antibiotics: Ancef 2 g ?Tourniquet time: None ?Estimated Blood Loss: Minimal ?Complications: None ?Specimens: None ?Implants: ?Implant Name Type Inv. Item Serial No. Manufacturer Lot No. LRB No.  Used Action  ?ANCHOR SUT 1.8 FIBERTAK SB KL - [id number] Anchor ANCHOR SUT 1.8 FIBERTAK SB KL  [employer] [id number] Left 1 Implanted  ?ANCHOR SUT 1.8 FIBERTAK SB KL - [id number] Anchor ANCHOR SUT 1.8 FIBERTAK SB KL  [employer] [id number] Left 1 Implanted  ?ANCHOR SUT 1.8 FIBERTAK SB KL - [id number] Anchor ANCHOR SUT 1.8 FIBERTAK SB KL  [employer] [id number] Left 1 Implanted  ?ANCHOR SUT 1.8 FIBERTAK SB KL - [id number] Anchor ANCHOR SUT 1.8 FIBERTAK SB KL  [employer] [id number] Left 1 Implanted  ?ANCHOR SUT 1.8 FIBERTAK SB KL - [id number] Anchor ANCHOR SUT 1.8 FIBERTAK SB KL  [employer] [id number] Left 1 Implanted  ?ANCHOR SUT 1.8 FIBERTAK SB KL - [id number] Anchor ANCHOR SUT 1.8 FIBERTAK SB KL  [employer] [id number] Left 1 Implanted  ? ? ?Indications for Surgery:   ?Olivia Kelly is a 36 y.o. female with shoulder instability failing non-operative management and at risk of continued instability.  We discussed options including continued rehab versus surgery.  Family and patient understand the nature of postop recovery.  The risks and benefits were explained at length including but not limited to continued pain, cuff failure, continued instability, pain, hardware malfunction, infection and stiffness were all discussed. ? ? ?Procedure:   ?Patient was correctly identified in the preoperative holding area and operative site marked.  Patient brought to OR and positioned lateral on a beanbag with an arthrex lateral positioner.  Anesthesia was induced and the operative shoulder was prepped and draped in the usual sterile fashion.  Timeout was called preincision. ? ?We began by making our portals including an anterior inferior portal just above the subscap by placing  a 6 spinal needle then switching stick and placing a cannula.  We made an anterior accessory portal just above this just below the biceps.  Once these were performed formed we switched the camera to the anterior portal and were able to place a posterior superior and  posterior inferior portal.  The posterior inferior portal was made with a percutaneous kit made by [employer]. ? ?Once portals were made we began by assessing our tissue.  Findings are above. ? ?We mobilized the labral tear extensively.   ? ?Tear at medialized to the medial face of the glenoid.  We repaired the glenoid for healing. ? ?This point we began by placing anchors.  We started at the 6 o'clock position and placed a knotless 1.8 mm Fibertak anchor shuttling sutures in typical fashion obtaining good purchase of the capsule labral tissue.  We repeated this process moving anteriorly placing 4 anchors between 4:00 and 9:00 good purchase of the tissue was obtained the tissue quality was poor.  The head was centered at the finish of the case. ? ?We did note that there was a SLAP tear as well.  We prepared the bone for healing using a spherical bur.  We then placed two 1.8 fiber tack anchors just anterior and posterior to the biceps anchor securing it in place. ? ?The incisions were closed with absorbable monocryl and steri strips.  A sterile dressing was placed along with a sling. The patient was awoken from general anesthesia and taken to the PACU in stable condition without complication.  ? ?Noemi Chapel, PA-C, present and scrubbed throughout the case, critical for completion in a timely fashion, and for retraction, instrumentation, closure. ?  ?

## 2021-12-02 NOTE — Anesthesia Procedure Notes (Signed)
Procedure Name: Intubation ?Date/Time: 12/02/2021 11:03 AM ?Performed by: Signe Colt, CRNA ?Pre-anesthesia Checklist: Patient identified, Emergency Drugs available, Suction available and Patient being monitored ?Patient Re-evaluated:Patient Re-evaluated prior to induction ?Oxygen Delivery Method: Circle system utilized ?Preoxygenation: Pre-oxygenation with 100% oxygen ?Induction Type: IV induction ?Ventilation: Mask ventilation without difficulty ?Laryngoscope Size: Mac and 3 ?Grade View: Grade I ?Tube type: Oral ?Tube size: 7.0 mm ?Number of attempts: 1 ?Airway Equipment and Method: Stylet and Oral airway ?Placement Confirmation: ETT inserted through vocal cords under direct vision, positive ETCO2 and breath sounds checked- equal and bilateral ?Secured at: 21 cm ?Tube secured with: Tape ?Dental Injury: Teeth and Oropharynx as per pre-operative assessment  ? ? ? ? ?

## 2021-12-02 NOTE — Anesthesia Procedure Notes (Signed)
Anesthesia Regional Block: Interscalene brachial plexus block  ? ?Pre-Anesthetic Checklist: , timeout performed,  Correct Patient, Correct Site, Correct Laterality,  Correct Procedure, Correct Position, site marked,  Risks and benefits discussed,  Surgical consent,  Pre-op evaluation,  At surgeon's request and post-op pain management ? ?Laterality: Left ? ?Prep: chloraprep     ?  ?Needles:  ?Injection technique: Single-shot ? ?Needle Type: Echogenic Stimulator Needle   ? ? ?Needle Length: 5cm  ?Needle Gauge: 22  ? ? ? ?Additional Needles: ? ? ?Procedures:, nerve stimulator,,,,,    ? ?Nerve Stimulator or Paresthesia:  ?Response: biceps flexion, 0.45 mA ? ?Additional Responses:  ? ?Narrative:  ?Start time: 12/02/2021 10:32 AM ?End time: 12/02/2021 10:41 AM ?Injection made incrementally with aspirations every 5 mL. ? ?Performed by: Personally  ?Anesthesiologist: Achille Rich, MD ? ?Additional Notes: ?Functioning IV was confirmed and monitors were applied.  A 13mm 22ga Arrow echogenic stimulator needle was used. Sterile prep and drape,hand hygiene and sterile gloves were used.  Negative aspiration and negative test dose prior to incremental administration of local anesthetic. The patient tolerated the procedure well. ? ?Ultrasound guidance: relevent anatomy identified, needle position confirmed, local anesthetic spread visualized around nerve(s), vascular puncture avoided.  Image printed for medical record.  ? ? ? ? ?

## 2021-12-02 NOTE — Anesthesia Postprocedure Evaluation (Signed)
Anesthesia Post Note ? ?Patient: Olivia Kelly ? ?Procedure(s) Performed: ARTHROSCOPY SHOULDER/DEBRIDEMENT and ROTATOR CUFF REPAIR with BANKART REPAIR (Left: Shoulder) ? ?  ? ?Patient location during evaluation: PACU ?Anesthesia Type: General and Regional ?Level of consciousness: awake and alert ?Pain management: pain level controlled ?Vital Signs Assessment: post-procedure vital signs reviewed and stable ?Respiratory status: spontaneous breathing, nonlabored ventilation, respiratory function stable and patient connected to nasal cannula oxygen ?Cardiovascular status: blood pressure returned to baseline and stable ?Postop Assessment: no apparent nausea or vomiting ?Anesthetic complications: no ? ? ?No notable events documented. ? ?Last Vitals:  ?Vitals:  ? 12/02/21 1245 12/02/21 1318  ?BP: 106/70 136/80  ?Pulse: 69 65  ?Resp: 14 16  ?Temp:  36.5 ?C  ?SpO2: 97% 97%  ?  ?Last Pain:  ?Vitals:  ? 12/02/21 1230  ?TempSrc:   ?PainSc: 0-No pain  ? ? ?  ?  ?  ?  ?  ?  ? ?Bokeelia S ? ? ? ? ?

## 2021-12-02 NOTE — Anesthesia Preprocedure Evaluation (Signed)
Anesthesia Evaluation  ?Patient identified by MRN, date of birth, ID band ?Patient awake ? ? ? ?Reviewed: ?Allergy & Precautions, H&P , NPO status , Patient's Chart, lab work & pertinent test results ? ?Airway ?Mallampati: II ? ? ?Neck ROM: full ? ? ? Dental ?  ?Pulmonary ?neg pulmonary ROS,  ?  ?breath sounds clear to auscultation ? ? ? ? ? ? Cardiovascular ?negative cardio ROS ? ? ?Rhythm:regular Rate:Normal ? ? ?  ?Neuro/Psych ?Seizures -,  PSYCHIATRIC DISORDERS Anxiety   ? GI/Hepatic ?  ?Endo/Other  ? ? Renal/GU ?  ? ?  ?Musculoskeletal ? ? Abdominal ?  ?Peds ? Hematology ?  ?Anesthesia Other Findings ? ? Reproductive/Obstetrics ? ?  ? ? ? ? ? ? ? ? ? ? ? ? ? ?  ?  ? ? ? ? ? ? ? ? ?Anesthesia Physical ?Anesthesia Plan ? ?ASA: 2 ? ?Anesthesia Plan: General  ? ?Post-op Pain Management: Regional block*  ? ?Induction: Intravenous ? ?PONV Risk Score and Plan: 3 and Ondansetron, Dexamethasone, Midazolam and Treatment may vary due to age or medical condition ? ?Airway Management Planned: Oral ETT ? ?Additional Equipment:  ? ?Intra-op Plan:  ? ?Post-operative Plan: Extubation in OR ? ?Informed Consent: I have reviewed the patients History and Physical, chart, labs and discussed the procedure including the risks, benefits and alternatives for the proposed anesthesia with the patient or authorized representative who has indicated his/her understanding and acceptance.  ? ? ? ?Dental advisory given ? ?Plan Discussed with: CRNA, Anesthesiologist and Surgeon ? ?Anesthesia Plan Comments:   ? ? ? ? ? ? ?Anesthesia Quick Evaluation ? ?

## 2021-12-02 NOTE — Transfer of Care (Signed)
Immediate Anesthesia Transfer of Care Note ? ?Patient: KIMANI BEDOYA ? ?Procedure(s) Performed: ARTHROSCOPY SHOULDER/DEBRIDEMENT and ROTATOR CUFF REPAIR withBANKART REPAIR (Left) ? ?Patient Location: PACU ? ?Anesthesia Type:GA combined with regional for post-op pain ? ?Level of Consciousness: awake, alert , oriented and patient cooperative ? ?Airway & Oxygen Therapy: Patient Spontanous Breathing and Patient connected to face mask oxygen ? ?Post-op Assessment: Report given to RN and Post -op Vital signs reviewed and stable ? ?Post vital signs: Reviewed and stable ? ?Last Vitals:  ?Vitals Value Taken Time  ?BP    ?Temp    ?Pulse 88 12/02/21 1222  ?Resp    ?SpO2 99 % 12/02/21 1222  ?Vitals shown include unvalidated device data. ? ?Last Pain:  ?Vitals:  ? 12/02/21 0941  ?TempSrc: Oral  ?PainSc: 7   ?   ? ?Patients Stated Pain Goal: 6 (12/02/21 0941) ? ?Complications: No notable events documented. ?

## 2021-12-03 ENCOUNTER — Encounter (HOSPITAL_BASED_OUTPATIENT_CLINIC_OR_DEPARTMENT_OTHER): Payer: Self-pay | Admitting: Orthopaedic Surgery

## 2021-12-03 NOTE — Progress Notes (Signed)
Left message stating courtesy call and if any questions or concerns please call the doctors office.  

## 2021-12-13 ENCOUNTER — Ambulatory Visit: Payer: Medicaid Other | Attending: Orthopaedic Surgery

## 2021-12-13 DIAGNOSIS — S43432A Superior glenoid labrum lesion of left shoulder, initial encounter: Secondary | ICD-10-CM | POA: Insufficient documentation

## 2021-12-13 DIAGNOSIS — M6281 Muscle weakness (generalized): Secondary | ICD-10-CM | POA: Insufficient documentation

## 2021-12-13 DIAGNOSIS — M25512 Pain in left shoulder: Secondary | ICD-10-CM | POA: Insufficient documentation

## 2021-12-13 NOTE — Therapy (Signed)
?OUTPATIENT PHYSICAL THERAPY SHOULDER EVALUATION ? ? ?Patient Name: Olivia Kelly ?MRN: [id number] ?DOB:1986-07-16, 36 y.o., female ?Today's Date: 12/13/2021 ? ? PT End of Session - 12/13/21 1431   ? ? Visit Number 1   ? Number of Visits 8   ? Date for PT Re-Evaluation 02/07/22   ? Authorization Type [place] [facility]CD   ? PT Start Time 1400   ? PT Stop Time 1445   ? PT Time Calculation (min) 45 min   ? Activity Tolerance Patient tolerated treatment well   ? Behavior During Therapy [email or web address] for tasks assessed/performed   ? ?  ?  ? ?  ? ? ?Past Medical History:  ?Diagnosis Date  ? Anemia due to blood loss, acute 04/03/2016  ? History of COVID-19 12/12/2019  ? Seizures (HCC)   ? last seizure 2015, on meds  ? ?Past Surgical History:  ?Procedure Laterality Date  ? cyst removed middle of chest at 814-34 years old    ? CYSTOSCOPY WITH RETROGRADE PYELOGRAM, URETEROSCOPY AND STENT PLACEMENT Left 10/29/2014  ? Procedure: CYSTOSCOPY WITH LEFT RETROGRADE PYELOGRAM,  DIAGNOSTIC LEFT  URETEROSCOPY AND STENT PLACEMENT;  Surgeon: Sebastian Acheheodore Manny, MD;  [facility];  Service: Urology;  Laterality: Left;  ? CYSTOSCOPY WITH RETROGRADE PYELOGRAM, URETEROSCOPY AND STENT PLACEMENT Left 01/23/2015  ? Procedure: CYSTOSCOPY WITH LEFT RETROGRADE PYELOGRAM/URETERAL STENT PLACEMENT;  Surgeon: Sebastian Acheheodore Manny, MD;  Location: [facility];  Service: Urology;  Laterality: Left;  ? IUD REMOVAL N/A 06/09/2015  ? Procedure: INTRAUTERINE DEVICE (IUD) REMOVAL;  Surgeon: Allie BossierMyra C Dove, MD;  Location: [facility];  Service: Gynecology;  Laterality: N/A;  ? NEPHRECTOMY    ? half of left kidney removed  ? ROBOT ASSISTED PYELOPLASTY Left 01/23/2015  ? Procedure: ROBOTIC ASSISTED PYELOPLASTY WITH STENT PLACEMENT;  Surgeon: Sebastian Acheheodore Manny, MD;  Location: [facility];  Service: Urology;  Laterality: Left;  ? SHOULDER ARTHROSCOPY WITH BANKART REPAIR Left 12/02/2021  ? Procedure: Arthroscopy Shoulder with Extensive Debridement Capsulorrhaphy and Superior Labrum  Anterior-Posterior/Bankart Repair;  Surgeon: Bjorn PippinVarkey, Dax T, MD;  Location: [facility];  Service: Orthopedics;  Laterality: Left;  ? ?Patient Active Problem List  ? Diagnosis Date Noted  ? Generalized anxiety disorder 09/01/2020  ? History of COVID-19 12/12/2019  ? 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 Seizures (HCC) 04/04/2013  ? ? ?PCP: Patient, No Pcp Per (Inactive) ? ?REFERRING PROVIDER: Bjorn PippinVarkey, Dax T, MD ? ?REFERRING DIAG: Left shoulder arthroscopy with labrael repair ,capsolorrhaphy, and SLAP repair on 3/30  ? ?THERAPY DIAG: L shoulder labral repair ? ? ? ?ONSET DATE: 12/02/21 ? ?SUBJECTIVE:                                                                                                                                                                                     ? ?  SUBJECTIVE STATEMENT: ?Incurred L labral tear following seizure in 12/23, ongoing pain until Dxed with L labral tear. ? ?PERTINENT HISTORY: ?History of seizures which lead to a L labral tear, patient is R handed ? ?PAIN:  ?Are you having pain? Yes: NPRS scale: 6/10 ? ?PRECAUTIONS: Shoulder follow post-op protocol ? ?WEIGHT BEARING RESTRICTIONS No ? ?FALLS:  ?Has patient fallen in last 6 months? No ? ?LIVING ENVIRONMENT: ?Lives with: lives with their family ?Lives in: House/apartment ? 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 ?PLOF: Independent ? ?PATIENT GOALS to reduce my L shoulder pain and regain function ? ?OBJECTIVE:  ? ?PATIENT SURVEYS:  ?Neldon Mc 36 ? ?COGNITION: ? Overall cognitive status: Within functional limits for tasks assessed ?    ?SENSATION: ?WFL ? ?POSTURE: ?Rounded shoulders ? ?UPPER EXTREMITY ROM:  ? ?Passive ROM Right ?12/13/2021 Left ?12/13/2021  ?Shoulder flexion  90d  ?Shoulder extension    ?Shoulder abduction  45d  ?Shoulder adduction    ?Shoulder internal rotation  70d  ?Shoulder external rotation  20d  ?Elbow flexion    ?Elbow extension    ?Wrist flexion     ?Wrist extension    ?Wrist ulnar deviation    ?Wrist radial deviation    ?Wrist pronation    ?Wrist supination    ?(Blank rows = not tested) ? ?UPPER EXTREMITY MMT: ? ?MMT Right ?12/13/2021 Left ?12/13/2021  ?Shoulder flexion  *  ?Shoulder extension  *  ?Shoulder abduction  *  ?Shoulder adduction  *  ?Shoulder internal rotation  *  ?Shoulder external rotation  *  ?Middle trapezius    ?Lower trapezius    ?Elbow flexion  3  ?Elbow extension  3  ?Wrist flexion  4  ?Wrist extension  4  ?Wrist ulnar deviation  4  ?Wrist radial deviation  4  ?Wrist pronation    ?Wrist supination    ?Grip strength (lbs)    ?(* not tested due to post-op status) ? ?SHOULDER SPECIAL TESTS: ? N/A post-op ?JOINT MOBILITY TESTING:  ?N/A ? ?PALPATION:  ?Guarding through L shoulder ?  ?TODAY'S TREATMENT:  ?Evaluation ? ? ?PATIENT EDUCATION: ?Education details: Discussed eval findings, rehab rationale and POC and patient is in agreement ?Person educated: Patient ?Education method: Explanation and Demonstration ?Education comprehension: verbalized understanding and needs further education ? ? ?HOME EXERCISE PROGRAM: ?Access Code: [email or web address] ?URL: [email or web address] ?Date: 12/13/2021 ?Prepared by: Gustavus Bryant ? ?Exercises ?- Seated Scapular Retraction  - 5 x daily - 7 x weekly - 1 sets - 10 reps ?- Seated Shoulder Shrugs  - 5 x daily - 7 x weekly - 1 sets - 10 reps ?- Supported Elbow Flexion Extension PROM  - 1 x daily - 7 x weekly - 1 sets - 10 reps ? ?ASSESSMENT: ? ?CLINICAL IMPRESSION: ?Patient is a 36 y.o. female who was seen today for physical therapy evaluation and treatment for L shoulder post-op labral repair.  ? ? ?OBJECTIVE IMPAIRMENTS decreased knowledge of use of DME, decreased ROM, decreased strength, impaired UE functional use, postural dysfunction, and pain.  ? ?ACTIVITY LIMITATIONS cleaning, driving, meal prep, and occupation.  ? ?PERSONAL FACTORS Age and Time since onset of injury/illness/exacerbation are also  affecting patient's functional outcome.  ? ? ?REHAB POTENTIAL: Good ? ?CLINICAL DECISION MAKING: Stable/uncomplicated ? ?EVALUATION COMPLEXITY: Moderate ? ? ?GOALS: ?Goals reviewed with patient? Yes ? ?SHORT TERM GOALS: Target date: 12/27/2021 ? ?Patient to demonstrate independence in HEP  ?Baseline:[id number] ?Goal status: INITIAL ? ?2.  Maintain PROM to 90d flexion, 45d abd, 20d ER  at side, IR to abdomen ?Baseline: 90d flexion, 45d abd, 20d ER at side, IR to abdomen ?Goal status: INITIAL ? ? ? ?LONG TERM GOALS: Target date: 01/10/2022 ? ?4/10 worst pain ?Baseline: 6/10 ?Goal status: INITIAL ? ?2.  AROM to 90d flexion, 45d abd, 20d ER at side, IR to abdomen ?Baseline: 90d flexion, 45d abd, 20d ER at side, IR to abdomen ?Goal status: INITIAL ? ?3.  Reassess progress at 6 week post-op and modify program as needed ?Baseline: Patient in phase I (1-6 weeks post-op) ?Goal status: INITIAL ? ?4.  Increase DASH score to 40/80 ?Baseline: 34 ?Goal status: INITIAL ? ? ? ?PLAN: ?PT FREQUENCY: 1x/week ? ?PT DURATION: 4 weeks ? ?PLANNED INTERVENTIONS: Therapeutic exercises, Therapeutic activity, Neuromuscular re-education, Balance training, Gait training, Patient/Family education, Joint mobilization, DME instructions, and Manual therapy ? ?PLAN FOR NEXT SESSION: review HEP, add isometrics per protocol, ROM ? ? ?Hildred Laser, PT ?12/13/2021, 2:40 PM  ? ?Check all possible CPT codes: 64403 - Re-evaluation, 97110- Therapeutic Exercise, [phone]- Neuro Re-education, [phone] - Gait Training, [phone] - Manual Therapy, 97530 - Therapeutic Activities, and [phone] - Vaso    ? ?If treatment provided at initial evaluation, no treatment charged due to lack of authorization.    ?  ?

## 2021-12-21 ENCOUNTER — Ambulatory Visit: Payer: Medicaid Other

## 2021-12-21 DIAGNOSIS — M6281 Muscle weakness (generalized): Secondary | ICD-10-CM | POA: Diagnosis not present

## 2021-12-21 DIAGNOSIS — S43432A Superior glenoid labrum lesion of left shoulder, initial encounter: Secondary | ICD-10-CM

## 2021-12-21 DIAGNOSIS — M25512 Pain in left shoulder: Secondary | ICD-10-CM

## 2021-12-21 NOTE — Therapy (Signed)
?OUTPATIENT PHYSICAL THERAPY TREATMENT NOTE ? ? ?Patient Name: Olivia Kelly ?MRN: [id number] ?DOB:1986-01-27, 36 y.o., female ?Today's Date: 12/21/2021 ? ?PCP: Patient, No Pcp Per (Inactive) ?REFERRING PROVIDER: Hiram Gash, MD ? ?END OF SESSION:  ? PT End of Session - 12/21/21 1449   ? ? Visit Number 2   ? Number of Visits 8   ? Date for PT Re-Evaluation 02/07/22   ? Authorization Type [place] MCD   ? PT Start Time 1450   ? PT Stop Time 1530   ? PT Time Calculation (min) 40 min   ? Activity Tolerance Patient tolerated treatment well   ? Behavior During Therapy [facility] for tasks assessed/performed   ? ?  ?  ? ?  ? ? ?Past Medical History:  ?Diagnosis Date  ? Anemia due to blood loss, acute 04/03/2016  ? History of COVID-19 12/12/2019  ? Seizures ([place])   ? last seizure 2015, on meds  ? ?Past Surgical History:  ?Procedure Laterality Date  ? cyst removed middle of chest at 24-59 years old    ? CYSTOSCOPY WITH RETROGRADE PYELOGRAM, URETEROSCOPY AND STENT PLACEMENT Left 10/29/2014  ? Procedure: CYSTOSCOPY WITH LEFT RETROGRADE PYELOGRAM,  DIAGNOSTIC LEFT  URETEROSCOPY AND STENT PLACEMENT;  Surgeon: Olivia Frock, MD;  Location: [facility];  Service: Urology;  Laterality: Left;  ? CYSTOSCOPY WITH RETROGRADE PYELOGRAM, URETEROSCOPY AND STENT PLACEMENT Left 01/23/2015  ? Procedure: CYSTOSCOPY WITH LEFT RETROGRADE PYELOGRAM/URETERAL STENT PLACEMENT;  Surgeon: Olivia Frock, MD;  Location: [facility];  Service: Urology;  Laterality: Left;  ? IUD REMOVAL N/A 06/09/2015  ? Procedure: INTRAUTERINE DEVICE (IUD) REMOVAL;  Surgeon: Olivia Filbert, MD;  Location: [facility];  Service: Gynecology;  Laterality: N/A;  ? NEPHRECTOMY    ? half of left kidney removed  ? ROBOT ASSISTED PYELOPLASTY Left 01/23/2015  ? Procedure: ROBOTIC ASSISTED PYELOPLASTY WITH STENT PLACEMENT;  Surgeon: Olivia Frock, MD;  Location: [facility];  Service: Urology;  Laterality: Left;  ? SHOULDER ARTHROSCOPY WITH BANKART REPAIR Left 12/02/2021  ? Procedure: Arthroscopy  Shoulder with Extensive Debridement Capsulorrhaphy and Superior Labrum Anterior-Posterior/Bankart Repair;  Surgeon: Olivia Gash, MD;  Location: [place];  Service: Orthopedics;  Laterality: Left;  ? ?Patient Active Problem List  ? Diagnosis Date Noted  ? Generalized anxiety disorder 09/01/2020  ? History of COVID-19 12/12/2019  ? 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 Seizures ([place]) 04/04/2013  ? ? ?REFERRING DIAG: Left shoulder arthroscopy with labrael repair ,capsolorrhaphy, and SLAP repair on 3/30  ? ?THERAPY DIAG: L shoulder labral repair ? ? ?PERTINENT HISTORY: History of seizures which lead to a L labral tear, patient is R handed ? ?PRECAUTIONS: seizure, Shoulder follow post-op protocol ? ?SUBJECTIVE: Shoulder stiff and painful ? ?PAIN:  ?Are you having pain? Yes: NPRS scale: 8/10 ?Pain location: L shoulder ?Pain description: ache ?Aggravating factors: motion and positions ?Relieving factors: meds ? ? ? ? ?OBJECTIVE:  ?  ?PATIENT SURVEYS:  ?Olivia Kelly 36 ?  ?COGNITION: ?          Overall cognitive status: Within functional limits for tasks assessed ?                                 ?SENSATION: ?WFL ?  ?POSTURE: ?Rounded shoulders ?  ?UPPER EXTREMITY ROM:  ?  ?Passive ROM Right ?12/13/2021 Left ?12/13/2021  ?Shoulder flexion  90d  ?Shoulder extension      ?Shoulder abduction   45d  ?Shoulder adduction      ?Shoulder internal rotation   70d  ?Shoulder external rotation   20d  ?Elbow flexion      ?Elbow extension      ?Wrist flexion      ?Wrist extension      ?Wrist ulnar deviation      ?Wrist radial deviation      ?Wrist pronation      ?Wrist supination      ?(Blank rows = not tested) ?  ?UPPER EXTREMITY MMT: ?  ?MMT Right ?12/13/2021 Left ?12/13/2021  ?Shoulder flexion   *  ?Shoulder extension   *  ?Shoulder abduction   *  ?Shoulder adduction   *  ?Shoulder internal rotation   *  ?Shoulder external rotation   *  ?Middle trapezius      ?Lower  trapezius      ?Elbow flexion   3  ?Elbow extension   3  ?Wrist flexion   4  ?Wrist extension   4  ?Wrist ulnar deviation   4  ?Wrist radial deviation   4  ?Wrist pronation      ?Wrist supination      ?Grip strength (lbs)      ?(* not tested due to post-op status) ?  ?SHOULDER SPECIAL TESTS: ?           N/A post-op ?JOINT MOBILITY TESTING:  ?N/A ?  ?PALPATION:  ?Guarding through L shoulder ?            ?TODAY'S TREATMENT:  ?[facility] Adult PT Treatment:                                                DATE: 12/21/21 ?Therapeutic Exercise: ?Supine elbow FE holding tennis ball 15/15 ?Pron/Sup holding tennis ball 15/15 ?Wrist FE holding tennis ball 15/15 ?Isometric F/E/ABD/ADD/IR/ER 3s hold 10x each ?Manual Therapy: ?PROM/AAROM 90d F, 45d ABD, 20d ER ?4 way scapula 10x ea. ? ?  ?  ?PATIENT EDUCATION: ?Education details: Discussed eval findings, rehab rationale and POC and patient is in agreement ?Person educated: Patient ?Education method: Explanation and Demonstration ?Education comprehension: verbalized understanding and needs further education ?  ?  ?HOME EXERCISE PROGRAM: ?Access Code: [email or web address] ?URL: [email or web address] ?Date: 12/21/2021 ?Prepared by: Olivia Kelly ? ?Exercises ?- Isometric Shoulder Flexion at Wall  - 2 x daily - 5 x weekly - 1 sets - 10 reps - 3 hold ?- Isometric Shoulder Abduction at Wall  - 2 x daily - 5 x weekly - 1 sets - 10 reps - 3 hold ?- Isometric Shoulder External Rotation at Wall  - 2 x daily - 5 x weekly - 1 sets - 10 reps - 3 hold ?- Standing Isometric Shoulder Internal Rotation at Doorway  - 2 x daily - 5 x weekly - 1 sets - 10 reps - 3 hold ?  ?ASSESSMENT: ?  ?CLINICAL IMPRESSION: Patient is 19 days post-op and still in phase 1 until 6 weeks, PROM goals met, introduced isometrics and AAROM per protocol, pain decreased from 8 to 5 at end of treatment.  HEP updated to include isometrics. ?  ?  ?OBJECTIVE IMPAIRMENTS decreased knowledge of use of DME, decreased ROM, decreased  strength, impaired UE functional use, postural dysfunction, and pain.  ?  ?  ACTIVITY LIMITATIONS cleaning, driving, meal prep, and occupation.  ?  ?PERSONAL FACTORS Age and Time since onset of injury/illness/exacerbation are also affecting patient's functional outcome.  ?  ?  ?REHAB POTENTIAL: Good ?  ?CLINICAL DECISION MAKING: Stable/uncomplicated ?  ?EVALUATION COMPLEXITY: Moderate ?  ?  ?GOALS: ?Goals reviewed with patient? Yes ?  ?SHORT TERM GOALS: Target date: 12/27/2021 ?  ?Patient to demonstrate independence in HEP  ?[id number] ?Goal status: INITIAL ?  ?2.  Maintain PROM to 90d flexion, 45d abd, 20d ER at side, IR to abdomen ?Baseline: 90d flexion, 45d abd, 20d ER at side, IR to abdomen ?Goal status: INITIAL ?  ?  ?  ?LONG TERM GOALS: Target date: 01/10/2022 ?  ?4/10 worst pain ?Baseline: 6/10 ?Goal status: INITIAL ?  ?2.  AROM to 90d flexion, 45d abd, 20d ER at side, IR to abdomen ?Baseline: 90d flexion, 45d abd, 20d ER at side, IR to abdomen ?Goal status: INITIAL ?  ?3.  Reassess progress at 6 week post-op and modify program as needed ?Baseline: Patient in phase I (1-6 weeks post-op) ?Goal status: INITIAL ?  ?4.  Increase DASH score to 40/80 ?Baseline: 34 ?Goal status: INITIAL ?  ?  ?  ?PLAN: ?PT FREQUENCY: 1x/week ?  ?PT DURATION: 4 weeks ?  ?PLANNED INTERVENTIONS: Therapeutic exercises, Therapeutic activity, Neuromuscular re-education, Balance training, Gait training, Patient/Family education, Joint mobilization, DME instructions, and Manual therapy ?  ?PLAN FOR NEXT SESSION: review HEP, add isometrics per protocol, ROM ? ? ? ?Lanice Shirts, PT ?12/21/2021, 3:53 PM ? ?   ?

## 2021-12-28 NOTE — Therapy (Signed)
?OUTPATIENT PHYSICAL THERAPY TREATMENT NOTE ? ? ?Patient Name: Olivia Kelly ?MRN: [id number] ?DOB:02-15-1986, 36 y.o., female ?Today's Date: 12/29/2021 ? ?PCP: Patient, No Pcp Per (Inactive) ?REFERRING PROVIDER: Bjorn Pippin, MD ? ?END OF SESSION:  ? PT End of Session - 12/29/21 1403   ? ? Visit Number 3   ? Number of Visits 8   ? Date for PT Re-Evaluation 02/07/22   ? Authorization Type [facility]   ? PT Start Time 1402   ? PT Stop Time 1442   ? PT Time Calculation (min) 40 min   ? Activity Tolerance Patient tolerated treatment well   ? Behavior During Therapy [facility] for tasks assessed/performed   ? ?  ?  ? ?  ? ? ?Past Medical History:  ?Diagnosis Date  ? Anemia due to blood loss, acute 04/03/2016  ? History of COVID-19 12/12/2019  ? Seizures (HCC)   ? last seizure 2015, on meds  ? ?Past Surgical History:  ?Procedure Laterality Date  ? cyst removed middle of chest at 67-64 years old    ? CYSTOSCOPY WITH RETROGRADE PYELOGRAM, URETEROSCOPY AND STENT PLACEMENT Left 10/29/2014  ? Procedure: CYSTOSCOPY WITH LEFT RETROGRADE PYELOGRAM,  DIAGNOSTIC LEFT  URETEROSCOPY AND STENT PLACEMENT;  Surgeon: Sebastian Ache, MD;  Location: [facility];  Service: Urology;  Laterality: Left;  ? CYSTOSCOPY WITH RETROGRADE PYELOGRAM, URETEROSCOPY AND STENT PLACEMENT Left 01/23/2015  ? Procedure: CYSTOSCOPY WITH LEFT RETROGRADE PYELOGRAM/URETERAL STENT PLACEMENT;  Surgeon: Sebastian Ache, MD;  Location: [facility];  Service: Urology;  Laterality: Left;  ? IUD REMOVAL N/A 06/09/2015  ? Procedure: INTRAUTERINE DEVICE (IUD) REMOVAL;  Surgeon: Allie Bossier, MD;  Location: [facility];  Service: Gynecology;  Laterality: N/A;  ? NEPHRECTOMY    ? half of left kidney removed  ? ROBOT ASSISTED PYELOPLASTY Left 01/23/2015  ? Procedure: ROBOTIC ASSISTED PYELOPLASTY WITH STENT PLACEMENT;  Surgeon: Sebastian Ache, MD;  Location: [facility];  Service: Urology;  Laterality: Left;  ? SHOULDER ARTHROSCOPY WITH BANKART REPAIR Left 12/02/2021  ? Procedure: Arthroscopy  Shoulder with Extensive Debridement Capsulorrhaphy and Superior Labrum Anterior-Posterior/Bankart Repair;  Surgeon: Bjorn Pippin, MD;  Location: [facility];  Service: Orthopedics;  Laterality: Left;  ? ?Patient Active Problem List  ? Diagnosis Date Noted  ? Generalized anxiety disorder 09/01/2020  ? History of COVID-19 12/12/2019  ? 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 Seizures (HCC) 04/04/2013  ? ? ?REFERRING DIAG: Left shoulder arthroscopy with labrael repair ,capsolorrhaphy, and SLAP repair on 3/30  ? ?THERAPY DIAG:  ?Muscle weakness (generalized) ? ?Acute pain of left shoulder ? ?Tear of left glenoid labrum, initial encounter ? ?PERTINENT HISTORY: History of seizures which lead to a L labral tear, patient is R handed ? ?PRECAUTIONS: seizure, Shoulder follow post-op protocol ? ?SUBJECTIVE: Patient reports HEP compliance. She reports overall she is felling well. ? ?PAIN:  ?Are you having pain? Yes: NPRS scale: 3/10 ?Pain location: L shoulder ?Pain description: ache ?Aggravating factors: motion and positions ?Relieving factors: meds ? ? ?OBJECTIVE: (objective measures completed at initial evaluation unless otherwise dated) ? ? ?OBJECTIVE:  ?  ?PATIENT SURVEYS:  ?Neldon Mc 36 ?  ?COGNITION: ?          Overall cognitive status: Within functional limits for tasks assessed ?                                 ?  SENSATION: ?WFL ?  ?POSTURE: ?Rounded shoulders ?  ?UPPER EXTREMITY ROM:  ?  ?Passive ROM Right ?12/13/2021 Left ?12/13/2021  ?Shoulder flexion   90d  ?Shoulder extension      ?Shoulder abduction   45d  ?Shoulder adduction      ?Shoulder internal rotation   70d  ?Shoulder external rotation   20d  ?Elbow flexion      ?Elbow extension      ?Wrist flexion      ?Wrist extension      ?Wrist ulnar deviation      ?Wrist radial deviation      ?Wrist pronation      ?Wrist supination      ?(Blank rows = not tested) ?  ?UPPER EXTREMITY MMT: ?  ?MMT  Right ?12/13/2021 Left ?12/13/2021  ?Shoulder flexion   *  ?Shoulder extension   *  ?Shoulder abduction   *  ?Shoulder adduction   *  ?Shoulder internal rotation   *  ?Shoulder external rotation   *  ?Middle trapezius      ?Lower trapezius      ?Elbow flexion   3  ?Elbow extension   3  ?Wrist flexion   4  ?Wrist extension   4  ?Wrist ulnar deviation   4  ?Wrist radial deviation   4  ?Wrist pronation      ?Wrist supination      ?Grip strength (lbs)      ?(* not tested due to post-op status) ?  ?SHOULDER SPECIAL TESTS: ?           N/A post-op ?JOINT MOBILITY TESTING:  ?N/A ?  ?PALPATION:  ?Guarding through L shoulder ?            ? ?TODAY'S TREATMENT:  ?[facility] Adult PT Treatment:                                                DATE: 12/29/2021 ?Therapeutic Exercise: ?Supine elbow FE holding tennis ball 15/15 ?Pron/Sup holding tennis ball 15/15 ?Wrist FE holding tennis ball 15/15 ?Isometric F/E/ABD/ADD/IR/ER 3s hold 10x each ?Cane flexion <90? 2x15 ?Cane ER <20? 2x15 ?Cane abduction <45? 1x15 (increase in "pinchy" pain, terminated) ?Manual Therapy: ?PROM/AAROM 90d F, 45d ABD, 20d ER ? ? ?[facility] Adult PT Treatment:                                                DATE: 12/21/21 ?Therapeutic Exercise: ?Supine elbow FE holding tennis ball 15/15 ?Pron/Sup holding tennis ball 15/15 ?Wrist FE holding tennis ball 15/15 ?Isometric F/E/ABD/ADD/IR/ER 3s hold 10x each ?Manual Therapy: ?PROM/AAROM 90d F, 45d ABD, 20d ER ?4 way scapula 10x ea. ?  ?  ?  ?PATIENT EDUCATION: ?Education details: Discussed eval findings, rehab rationale and POC and patient is in agreement ?Person educated: Patient ?Education method: Explanation and Demonstration ?Education comprehension: verbalized understanding and needs further education ?  ?  ?HOME EXERCISE PROGRAM: ?Access Code: [email or web address] ?URL: [email or web address] ?Date: 12/21/2021 ?Prepared by: Gustavus Bryant ?  ?Exercises ?- Isometric Shoulder Flexion at Wall  - 2 x daily - 5 x weekly - 1 sets  - 10 reps - 3 hold ?- Isometric Shoulder Abduction at Wall  - 2 x daily -  5 x weekly - 1 sets - 10 reps - 3 hold ?- Isometric Shoulder External Rotation at Wall  - 2 x daily - 5 x weekly - 1 sets - 10 reps - 3 hold ?- Standing Isometric Shoulder Internal Rotation at Doorway  - 2 x daily - 5 x weekly - 1 sets - 10 reps - 3 hold ?  ?ASSESSMENT: ?  ?CLINICAL IMPRESSION:  ?Patient remains in phase one of post-op protocol. She reports HEP compliance and that her pain has been less recently. Continued with isometrics and AAROM per the protocol, introducing cane exercises within protocol ranges. She did have a sharp pain in the anterior portion of her shoulder with AAROM abduction, this exercise was terminated early. Patient was able to tolerate all prescribed exercises with no adverse effects. Patient continues to benefit from skilled PT services and should be progressed as able to improve functional independence. ?  ?  ?OBJECTIVE IMPAIRMENTS decreased knowledge of use of DME, decreased ROM, decreased strength, impaired UE functional use, postural dysfunction, and pain.  ?  ?ACTIVITY LIMITATIONS cleaning, driving, meal prep, and occupation.  ?  ?PERSONAL FACTORS Age and Time since onset of injury/illness/exacerbation are also affecting patient's functional outcome.  ?  ?  ?REHAB POTENTIAL: Good ?  ?CLINICAL DECISION MAKING: Stable/uncomplicated ?  ?EVALUATION COMPLEXITY: Moderate ?  ?  ?GOALS: ?Goals reviewed with patient? Yes ?  ?SHORT TERM GOALS: Target date: 12/27/2021 ?  ?Patient to demonstrate independence in HEP  ?Baseline:[id number] ?Goal status: INITIAL ?  ?2.  Maintain PROM to 90d flexion, 45d abd, 20d ER at side, IR to abdomen ?Baseline: 90d flexion, 45d abd, 20d ER at side, IR to abdomen ?Goal status: INITIAL ?  ?  ?  ?LONG TERM GOALS: Target date: 01/10/2022 ?  ?4/10 worst pain ?Baseline: 6/10 ?Goal status: INITIAL ?  ?2.  AROM to 90d flexion, 45d abd, 20d ER at side, IR to abdomen ?Baseline: 90d flexion, 45d abd,  20d ER at side, IR to abdomen ?Goal status: INITIAL ?  ?3.  Reassess progress at 6 week post-op and modify program as needed ?Baseline: Patient in phase I (1-6 weeks post-op) ?Goal status: INITIAL ?  ?4.

## 2021-12-29 ENCOUNTER — Ambulatory Visit: Payer: Medicaid Other

## 2021-12-29 DIAGNOSIS — M6281 Muscle weakness (generalized): Secondary | ICD-10-CM

## 2021-12-29 DIAGNOSIS — S43432A Superior glenoid labrum lesion of left shoulder, initial encounter: Secondary | ICD-10-CM

## 2021-12-29 DIAGNOSIS — M25512 Pain in left shoulder: Secondary | ICD-10-CM

## 2022-01-03 ENCOUNTER — Ambulatory Visit: Payer: Medicaid Other | Attending: Orthopaedic Surgery

## 2022-01-03 DIAGNOSIS — S43432A Superior glenoid labrum lesion of left shoulder, initial encounter: Secondary | ICD-10-CM | POA: Insufficient documentation

## 2022-01-03 DIAGNOSIS — M25512 Pain in left shoulder: Secondary | ICD-10-CM | POA: Diagnosis present

## 2022-01-03 DIAGNOSIS — M6281 Muscle weakness (generalized): Secondary | ICD-10-CM | POA: Insufficient documentation

## 2022-01-03 NOTE — Therapy (Signed)
?OUTPATIENT PHYSICAL THERAPY TREATMENT NOTE ? ? ?Patient Name: Olivia Kelly ?MRN: [id number] ?DOB:01/26/1986, 36 y.o., female ?Today's Date: 01/03/2022 ? ?PCP: Patient, No Pcp Per (Inactive) ?REFERRING PROVIDER: Hiram Gash, MD ? ?END OF SESSION:  ? PT End of Session - 01/03/22 1142   ? ? Visit Number 4   ? Number of Visits 8   ? Date for PT Re-Evaluation 02/07/22   ? Authorization Type [place] [facility]   ? PT Start Time 1145   ? PT Stop Time [id number]   ? PT Time Calculation (min) 30 min   ? Activity Tolerance Patient tolerated treatment well   ? Behavior During Therapy [facility] for tasks assessed/performed   ? ?  ?  ? ?  ? ? ?Past Medical History:  ?Diagnosis Date  ? Anemia due to blood loss, acute 04/03/2016  ? History of COVID-19 12/12/2019  ? Seizures ([place])   ? last seizure 2015, on meds  ? ?Past Surgical History:  ?Procedure Laterality Date  ? cyst removed middle of chest at 97-8 years old    ? CYSTOSCOPY WITH RETROGRADE PYELOGRAM, URETEROSCOPY AND STENT PLACEMENT Left 10/29/2014  ? Procedure: CYSTOSCOPY WITH LEFT RETROGRADE PYELOGRAM,  DIAGNOSTIC LEFT  URETEROSCOPY AND STENT PLACEMENT;  Surgeon: Alexis Frock, MD;  Location: [facility];  Service: Urology;  Laterality: Left;  ? CYSTOSCOPY WITH RETROGRADE PYELOGRAM, URETEROSCOPY AND STENT PLACEMENT Left 01/23/2015  ? Procedure: CYSTOSCOPY WITH LEFT RETROGRADE PYELOGRAM/URETERAL STENT PLACEMENT;  Surgeon: Alexis Frock, MD;  Location: [facility];  Service: Urology;  Laterality: Left;  ? IUD REMOVAL N/A 06/09/2015  ? Procedure: INTRAUTERINE DEVICE (IUD) REMOVAL;  Surgeon: Emily Filbert, MD;  Location: [facility];  Service: Gynecology;  Laterality: N/A;  ? NEPHRECTOMY    ? half of left kidney removed  ? ROBOT ASSISTED PYELOPLASTY Left 01/23/2015  ? Procedure: ROBOTIC ASSISTED PYELOPLASTY WITH STENT PLACEMENT;  Surgeon: Alexis Frock, MD;  Location: [facility];  Service: Urology;  Laterality: Left;  ? SHOULDER ARTHROSCOPY WITH BANKART REPAIR Left 12/02/2021  ? Procedure: Arthroscopy  Shoulder with Extensive Debridement Capsulorrhaphy and Superior Labrum Anterior-Posterior/Bankart Repair;  Surgeon: Hiram Gash, MD;  Location: [place];  Service: Orthopedics;  Laterality: Left;  ? ?Patient Active Problem List  ? Diagnosis Date Noted  ? Generalized anxiety disorder 09/01/2020  ? History of COVID-19 12/12/2019  ? 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 Seizures ([place]) 04/04/2013  ? ? ?REFERRING DIAG: Left shoulder arthroscopy with labrael repair ,capsolorrhaphy, and SLAP repair on 3/30  ? ?THERAPY DIAG:  ?Muscle weakness (generalized) ? ?Acute pain of left shoulder ? ?Tear of left glenoid labrum, initial encounter ? ?PERTINENT HISTORY: History of seizures which lead to a L labral tear, patient is R handed ? ?PRECAUTIONS: seizure, Shoulder follow post-op protocol ? ?SUBJECTIVE: 5/10 worst pain, 0/10 resting pain, new exercises going well ? ?PAIN:  ?Are you having pain? Yes: NPRS scale: 3/10 ?Pain location: L shoulder ?Pain description: ache ?Aggravating factors: motion and positions ?Relieving factors: meds ? ? ?OBJECTIVE: (objective measures completed at initial evaluation unless otherwise dated) ? ? ?OBJECTIVE:  ?  ?PATIENT SURVEYS:  ?[name] 36 ?  ?COGNITION: ?          Overall cognitive status: Within functional limits for tasks assessed ?                                 ?  SENSATION: ?WFL ?  ?POSTURE: ?Rounded shoulders ?  ?UPPER EXTREMITY ROM:  ?  ?Passive ROM Right ?12/13/2021 Left ?12/13/2021  ?Shoulder flexion   90d  ?Shoulder extension      ?Shoulder abduction   45d  ?Shoulder adduction      ?Shoulder internal rotation   70d  ?Shoulder external rotation   20d  ?Elbow flexion      ?Elbow extension      ?Wrist flexion      ?Wrist extension      ?Wrist ulnar deviation      ?Wrist radial deviation      ?Wrist pronation      ?Wrist supination      ?(Blank rows = not tested) ?  ?UPPER EXTREMITY MMT: ?  ?MMT Right ?12/13/2021  Left ?12/13/2021  ?Shoulder flexion   *  ?Shoulder extension   *  ?Shoulder abduction   *  ?Shoulder adduction   *  ?Shoulder internal rotation   *  ?Shoulder external rotation   *  ?Middle trapezius      ?Lower trapezius      ?Elbow flexion   3  ?Elbow extension   3  ?Wrist flexion   4  ?Wrist extension   4  ?Wrist ulnar deviation   4  ?Wrist radial deviation   4  ?Wrist pronation      ?Wrist supination      ?Grip strength (lbs)      ?(* not tested due to post-op status) ?  ?SHOULDER SPECIAL TESTS: ?           N/A post-op ?JOINT MOBILITY TESTING:  ?N/A ?  ?PALPATION:  ?Guarding through L shoulder ?            ? ?TODAY'S TREATMENT:  ?[facility] Adult PT Treatment:                                                DATE: 01/03/22 ?Therapeutic Exercise: ?Supine elbow FE holding tennis ball 15/15 ?OH pulleys, flexion to 90d 3 min ?Supine chest press 15x2 cane ?Supine ER 15x2 cane ?Supine abduction 15x2 cane ?Isometric F/E/ABD/ADD/IR/ER 3s hold 10x each ?Manual Therapy: ?Rhythmic stabilization at 90d flexion 30s x3 ?4 way scapula 20x ea. ? ? ?[facility] Adult PT Treatment:                                                DATE: 12/29/2021 ?Therapeutic Exercise: ?Supine elbow FE holding tennis ball 15/15 ?Pron/Sup holding tennis ball 15/15 ?Wrist FE holding tennis ball 15/15 ?Isometric F/E/ABD/ADD/IR/ER 3s hold 10x each ?Cane flexion <90? 2x15 ?Cane ER <20? 2x15 ?Cane abduction <45? 1x15 (increase in "pinchy" pain, terminated) ?Manual Therapy: ?PROM/AAROM 90d F, 45d ABD, 20d ER ? ? ?[facility] Adult PT Treatment:                                                DATE: 12/21/21 ?Therapeutic Exercise: ?Supine elbow FE holding tennis ball 15/15 ?Pron/Sup holding tennis ball 15/15 ?Wrist FE holding tennis ball 15/15 ?Isometric F/E/ABD/ADD/IR/ER 3s hold 10x each ?Manual Therapy: ?PROM/AAROM 90d F,  45d ABD, 20d ER ?4 way scapula 10x ea. ?  ?  ?  ?PATIENT EDUCATION: ?Education details: Discussed eval findings, rehab rationale and POC and patient is in  agreement ?Person educated: Patient ?Education method: Explanation and Demonstration ?Education comprehension: verbalized understanding and needs further education ?  ?  ?HOME EXERCISE PROGRAM: ?Access Code: [email or web address] ?URL: [email or web address] ?Date: 12/21/2021 ?Prepared by: Sharlynn Oliphant ?  ?Exercises ?- Isometric Shoulder Flexion at Wall  - 2 x daily - 5 x weekly - 1 sets - 10 reps - 3 hold ?- Isometric Shoulder Abduction at Wall  - 2 x daily - 5 x weekly - 1 sets - 10 reps - 3 hold ?- Isometric Shoulder External Rotation at Wall  - 2 x daily - 5 x weekly - 1 sets - 10 reps - 3 hold ?- Standing Isometric Shoulder Internal Rotation at Doorway  - 2 x daily - 5 x weekly - 1 sets - 10 reps - 3 hold ?  ?ASSESSMENT: ?  ?CLINICAL IMPRESSION:  ?(Late for session) Advanced to AAROM in flexion, ER and abduction.  Added rhythmic stabilization in 90d flexion in place of isometrics.  All ROM restriction achieved with much less guarding. ?  ?  ?OBJECTIVE IMPAIRMENTS decreased knowledge of use of DME, decreased ROM, decreased strength, impaired UE functional use, postural dysfunction, and pain.  ?  ?ACTIVITY LIMITATIONS cleaning, driving, meal prep, and occupation.  ?  ?PERSONAL FACTORS Age and Time since onset of injury/illness/exacerbation are also affecting patient's functional outcome.  ?  ?  ?REHAB POTENTIAL: Good ?  ?CLINICAL DECISION MAKING: Stable/uncomplicated ?  ?EVALUATION COMPLEXITY: Moderate ?  ?  ?GOALS: ?Goals reviewed with patient? Yes ?  ?SHORT TERM GOALS: Target date: 12/27/2021 ?  ?Patient to demonstrate independence in HEP  ?[id number] ?Goal status: INITIAL ?  ?2.  Maintain PROM to 90d flexion, 45d abd, 20d ER at side, IR to abdomen ?Baseline: 90d flexion, 45d abd, 20d ER at side, IR to abdomen ?Goal status: INITIAL ?  ?  ?  ?LONG TERM GOALS: Target date: 01/10/2022 ?  ?4/10 worst pain ?Baseline: 6/10 ?Goal status: INITIAL ?  ?2.  AROM to 90d flexion, 45d abd, 20d ER at side, IR to  abdomen ?Baseline: 90d flexion, 45d abd, 20d ER at side, IR to abdomen ?Goal status: INITIAL ?  ?3.  Reassess progress at 6 week post-op and modify program as needed ?Baseline: Patient in phase I (1-6 weeks post-op) ?

## 2022-01-10 ENCOUNTER — Ambulatory Visit: Payer: Medicaid Other

## 2022-01-10 DIAGNOSIS — S43432A Superior glenoid labrum lesion of left shoulder, initial encounter: Secondary | ICD-10-CM

## 2022-01-10 DIAGNOSIS — M25512 Pain in left shoulder: Secondary | ICD-10-CM

## 2022-01-10 DIAGNOSIS — M6281 Muscle weakness (generalized): Secondary | ICD-10-CM | POA: Diagnosis not present

## 2022-01-10 NOTE — Therapy (Signed)
?OUTPATIENT PHYSICAL THERAPY TREATMENT NOTE ? ? ?Patient Name: Olivia Kelly ?MRN: [id number] ?DOB:12-17-1985, 36 y.o., female ?Today's Date: 01/10/2022 ? ?PCP: Patient, No Pcp Per (Inactive) ?REFERRING PROVIDER: Hiram Gash, MD ? ?END OF SESSION:  ? PT End of Session - 01/10/22 1201   ? ? Visit Number 5   ? Number of Visits 8   ? Date for PT Re-Evaluation 02/07/22   ? Authorization Type [place] [facility]   ? PT Start Time 1145   ? PT Stop Time 2831   ? PT Time Calculation (min) 30 min   ? Activity Tolerance Patient tolerated treatment well   ? Behavior During Therapy [facility] for tasks assessed/performed   ? ?  ?  ? ?  ? ? ? ?Past Medical History:  ?Diagnosis Date  ? Anemia due to blood loss, acute 04/03/2016  ? History of COVID-19 12/12/2019  ? Seizures ([place])   ? last seizure 2015, on meds  ? ?Past Surgical History:  ?Procedure Laterality Date  ? cyst removed middle of chest at 61-20 years old    ? CYSTOSCOPY WITH RETROGRADE PYELOGRAM, URETEROSCOPY AND STENT PLACEMENT Left 10/29/2014  ? Procedure: CYSTOSCOPY WITH LEFT RETROGRADE PYELOGRAM,  DIAGNOSTIC LEFT  URETEROSCOPY AND STENT PLACEMENT;  Surgeon: Alexis Frock, MD;  Location: [facility];  Service: Urology;  Laterality: Left;  ? CYSTOSCOPY WITH RETROGRADE PYELOGRAM, URETEROSCOPY AND STENT PLACEMENT Left 01/23/2015  ? Procedure: CYSTOSCOPY WITH LEFT RETROGRADE PYELOGRAM/URETERAL STENT PLACEMENT;  Surgeon: Alexis Frock, MD;  Location: [facility];  Service: Urology;  Laterality: Left;  ? IUD REMOVAL N/A 06/09/2015  ? Procedure: INTRAUTERINE DEVICE (IUD) REMOVAL;  Surgeon: Emily Filbert, MD;  Location: [facility];  Service: Gynecology;  Laterality: N/A;  ? NEPHRECTOMY    ? half of left kidney removed  ? ROBOT ASSISTED PYELOPLASTY Left 01/23/2015  ? Procedure: ROBOTIC ASSISTED PYELOPLASTY WITH STENT PLACEMENT;  Surgeon: Alexis Frock, MD;  Location: [facility];  Service: Urology;  Laterality: Left;  ? SHOULDER ARTHROSCOPY WITH BANKART REPAIR Left 12/02/2021  ? Procedure:  Arthroscopy Shoulder with Extensive Debridement Capsulorrhaphy and Superior Labrum Anterior-Posterior/Bankart Repair;  Surgeon: Hiram Gash, MD;  Location: [facility];  Service: Orthopedics;  Laterality: Left;  ? ?Patient Active Problem List  ? Diagnosis Date Noted  ? Generalized anxiety disorder 09/01/2020  ? History of COVID-19 12/12/2019  ? 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 Seizures ([place]) 04/04/2013  ? ? ?REFERRING DIAG: Left shoulder arthroscopy with labrael repair ,capsolorrhaphy, and SLAP repair on 3/30  ? ?THERAPY DIAG:  ?Muscle weakness (generalized) ? ?Acute pain of left shoulder ? ?Tear of left glenoid labrum, initial encounter ? ?PERTINENT HISTORY: History of seizures which lead to a L labral tear, patient is R handed ? ?PRECAUTIONS: seizure, Shoulder follow post-op protocol ? ?SUBJECTIVE: 2/10 worst pain, 0/10 resting pain ? ?PAIN:  ?Are you having pain? Yes: NPRS scale: 3/10 ?Pain location: L shoulder ?Pain description: ache ?Aggravating factors: motion and positions ?Relieving factors: meds ? ? ?OBJECTIVE: (objective measures completed at initial evaluation unless otherwise dated) ? ? ?OBJECTIVE:  ?  ?PATIENT SURVEYS:  ?Olivia Kelly 36 ?  ?COGNITION: ?          Overall cognitive status: Within functional limits for tasks assessed ?                                 ?  SENSATION: ?WFL ?  ?POSTURE: ?Rounded shoulders ?  ?UPPER EXTREMITY ROM:  ?  ?Passive ROM Right ?12/13/2021 Left ?12/13/2021  ?Shoulder flexion   90d  ?Shoulder extension      ?Shoulder abduction   45d  ?Shoulder adduction      ?Shoulder internal rotation   70d  ?Shoulder external rotation   20d  ?Elbow flexion      ?Elbow extension      ?Wrist flexion      ?Wrist extension      ?Wrist ulnar deviation      ?Wrist radial deviation      ?Wrist pronation      ?Wrist supination      ?(Blank rows = not tested) ?  ?UPPER EXTREMITY MMT: ?  ?MMT Right ?12/13/2021 Left ?12/13/2021   ?Shoulder flexion   *  ?Shoulder extension   *  ?Shoulder abduction   *  ?Shoulder adduction   *  ?Shoulder internal rotation   *  ?Shoulder external rotation   *  ?Middle trapezius      ?Lower trapezius      ?Elbow flexion   3  ?Elbow extension   3  ?Wrist flexion   4  ?Wrist extension   4  ?Wrist ulnar deviation   4  ?Wrist radial deviation   4  ?Wrist pronation      ?Wrist supination      ?Grip strength (lbs)      ?(* not tested due to post-op status) ?  ?SHOULDER SPECIAL TESTS: ?           N/A post-op ?JOINT MOBILITY TESTING:  ?N/A ?  ?PALPATION:  ?Guarding through L shoulder ?            ? ?TODAY'S TREATMENT:  ?[facility] Adult PT Treatment:                                                DATE: 01/10/22 ?Therapeutic Exercise: ?OH pulleys, flexion to 90d 3 min ?Supine chest press 15x2 cane ?Seated ER 15x2 cane ?Supine abduction 15x2 cane ?Isometric F/E/ABD/ADD/IR/ER 3s hold 10x each ?Manual Therapy: ?Rhythmic stabilization at 90d flexion 30s x3 ?4 way scapula 20x ea. ? ?[facility] Adult PT Treatment:                                                DATE: 01/03/22 ?Therapeutic Exercise: ?Supine elbow FE holding tennis ball 15/15 ?OH pulleys, flexion to 90d 3 min ?Supine chest press 15x2 cane ?Supine ER 15x2 cane ?Supine abduction 15x2 cane ?Isometric F/E/ABD/ADD/IR/ER 3s hold 10x each ?Manual Therapy: ?Rhythmic stabilization at 90d flexion 30s x3 ?4 way scapula 20x ea. ? ? ?[facility] Adult PT Treatment:                                                DATE: 12/29/2021 ?Therapeutic Exercise: ?Supine elbow FE holding tennis ball 15/15 ?Pron/Sup holding tennis ball 15/15 ?Wrist FE holding tennis ball 15/15 ?Isometric F/E/ABD/ADD/IR/ER 3s hold 10x each ?Cane flexion <90? 2x15 ?Cane ER <20? 2x15 ?Cane abduction <45? 1x15 (increase in "pinchy" pain,  terminated) ?Manual Therapy: ?PROM/AAROM 90d F, 45d ABD, 20d ER ? ?  ?  ?  ?PATIENT EDUCATION: ?Education details: Discussed eval findings, rehab rationale and POC and patient is in  agreement ?Person educated: Patient ?Education method: Explanation and Demonstration ?Education comprehension: verbalized understanding and needs further education ?  ?  ?HOME EXERCISE PROGRAM: ?Access Code: [email or web address] ?URL: [email or web address] ?Date: 12/21/2021 ?Prepared by: Sharlynn Oliphant ?  ?Exercises ?- Isometric Shoulder Flexion at Wall  - 2 x daily - 5 x weekly - 1 sets - 10 reps - 3 hold ?- Isometric Shoulder Abduction at Wall  - 2 x daily - 5 x weekly - 1 sets - 10 reps - 3 hold ?- Isometric Shoulder External Rotation at Wall  - 2 x daily - 5 x weekly - 1 sets - 10 reps - 3 hold ?- Standing Isometric Shoulder Internal Rotation at Doorway  - 2 x daily - 5 x weekly - 1 sets - 10 reps - 3 hold ?  ?ASSESSMENT: ?  ?CLINICAL IMPRESSION:  ?L shoulder discomfort much improved, now only sore.  Still not at 6 weeks post-op and unable to progress ROM per protocol but able to modify HEP.  ROM goals of 90d flexion, 20d ER and 45d abduction met.  All STGs met at this time, will  f/u with ortho this week. ?  ?OBJECTIVE IMPAIRMENTS decreased knowledge of use of DME, decreased ROM, decreased strength, impaired UE functional use, postural dysfunction, and pain.  ?  ?ACTIVITY LIMITATIONS cleaning, driving, meal prep, and occupation.  ?  ?PERSONAL FACTORS Age and Time since onset of injury/illness/exacerbation are also affecting patient's functional outcome.  ?  ?  ?REHAB POTENTIAL: Good ?  ?CLINICAL DECISION MAKING: Stable/uncomplicated ?  ?EVALUATION COMPLEXITY: Moderate ?  ?  ?GOALS: ?Goals reviewed with patient? Yes ?  ?SHORT TERM GOALS: Target date: 12/27/2021 ?  ?Patient to demonstrate independence in HEP  ?[id number] ?Goal status: Met ?  ?2.  Maintain PROM to 90d flexion, 45d abd, 20d ER at side, IR to abdomen ?Baseline: 90d flexion, 45d abd, 20d ER at side, IR to abdomen ?Goal status: Met ?  ?  ?  ?LONG TERM GOALS: Target date: 01/10/2022 ?  ?4/10 worst pain ?Baseline: 6/10 ?Goal status: INITIAL ?  ?2.   AROM to 90d flexion, 45d abd, 20d ER at side, IR to abdomen ?Baseline: 90d flexion, 45d abd, 20d ER at side, IR to abdomen ?Goal status: INITIAL ?  ?3.  Reassess progress at 6 week post-op and modify program as needed ?[employer]

## 2022-01-17 ENCOUNTER — Ambulatory Visit: Payer: Medicaid Other

## 2022-01-17 DIAGNOSIS — S43432A Superior glenoid labrum lesion of left shoulder, initial encounter: Secondary | ICD-10-CM

## 2022-01-17 DIAGNOSIS — M6281 Muscle weakness (generalized): Secondary | ICD-10-CM

## 2022-01-17 DIAGNOSIS — M25512 Pain in left shoulder: Secondary | ICD-10-CM

## 2022-01-17 NOTE — Therapy (Signed)
?OUTPATIENT PHYSICAL THERAPY TREATMENT NOTE ? ? ?Patient Name: Olivia Kelly ?MRN: [id number] ?DOB:09-29-1985, 36 y.o., female ?Today's Date: 01/17/2022 ? ?PCP: Patient, No Pcp Per (Inactive) ?REFERRING PROVIDER: Hiram Gash, MD ? ?END OF SESSION:  ? PT End of Session - 01/17/22 1055   ? ? Visit Number 6   ? Number of Visits 8   ? Date for PT Re-Evaluation 02/07/22   ? Authorization Type [place] [facility]   ? PT Start Time 1050   ? PT Stop Time 1130   ? PT Time Calculation (min) 40 min   ? Activity Tolerance Patient tolerated treatment well   ? Behavior During Therapy [facility] for tasks assessed/performed   ? ?  ?  ? ?  ? ? ? ? ?Past Medical History:  ?Diagnosis Date  ? Anemia due to blood loss, acute 04/03/2016  ? History of COVID-19 12/12/2019  ? Seizures ([place])   ? last seizure 2015, on meds  ? ?Past Surgical History:  ?Procedure Laterality Date  ? cyst removed middle of chest at 74-12 years old    ? CYSTOSCOPY WITH RETROGRADE PYELOGRAM, URETEROSCOPY AND STENT PLACEMENT Left 10/29/2014  ? Procedure: CYSTOSCOPY WITH LEFT RETROGRADE PYELOGRAM,  DIAGNOSTIC LEFT  URETEROSCOPY AND STENT PLACEMENT;  Surgeon: Alexis Frock, MD;  Location: [facility];  Service: Urology;  Laterality: Left;  ? CYSTOSCOPY WITH RETROGRADE PYELOGRAM, URETEROSCOPY AND STENT PLACEMENT Left 01/23/2015  ? Procedure: CYSTOSCOPY WITH LEFT RETROGRADE PYELOGRAM/URETERAL STENT PLACEMENT;  Surgeon: Alexis Frock, MD;  Location: [facility];  Service: Urology;  Laterality: Left;  ? IUD REMOVAL N/A 06/09/2015  ? Procedure: INTRAUTERINE DEVICE (IUD) REMOVAL;  Surgeon: Emily Filbert, MD;  Location: [facility];  Service: Gynecology;  Laterality: N/A;  ? NEPHRECTOMY    ? half of left kidney removed  ? ROBOT ASSISTED PYELOPLASTY Left 01/23/2015  ? Procedure: ROBOTIC ASSISTED PYELOPLASTY WITH STENT PLACEMENT;  Surgeon: Alexis Frock, MD;  Location: [facility];  Service: Urology;  Laterality: Left;  ? SHOULDER ARTHROSCOPY WITH BANKART REPAIR Left 12/02/2021  ? Procedure:  Arthroscopy Shoulder with Extensive Debridement Capsulorrhaphy and Superior Labrum Anterior-Posterior/Bankart Repair;  Surgeon: Hiram Gash, MD;  Location: [facility];  Service: Orthopedics;  Laterality: Left;  ? ?Patient Active Problem List  ? Diagnosis Date Noted  ? Generalized anxiety disorder 09/01/2020  ? History of COVID-19 12/12/2019  ? 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 Seizures ([place]) 04/04/2013  ? ? ?REFERRING DIAG: Left shoulder arthroscopy with labrael repair ,capsolorrhaphy, and SLAP repair on 3/30  ? ?THERAPY DIAG:  ?Muscle weakness (generalized) ? ?Acute pain of left shoulder ? ?Tear of left glenoid labrum, initial encounter ? ?PERTINENT HISTORY: History of seizures which lead to a L labral tear, patient is R handed ? ?PRECAUTIONS: seizure, Shoulder follow post-op protocol ? ?SUBJECTIVE: Patient at 6 week mark, may remove sling and advance to phase 2 of protocol.  Saw MD last week and progressing well  ? ?PAIN:  ?Are you having pain? Yes: NPRS scale: 3/10 ?Pain location: L shoulder ?Pain description: ache ?Aggravating factors: motion and positions ?Relieving factors: meds ? ? ?OBJECTIVE: (objective measures completed at initial evaluation unless otherwise dated) ? ? ?OBJECTIVE:  ?  ?PATIENT SURVEYS:  ?Olivia Kelly 36 ?  ?COGNITION: ?          Overall cognitive status: Within functional limits for tasks assessed ?                                 ?  SENSATION: ?WFL ?  ?POSTURE: ?Rounded shoulders ?  ?UPPER EXTREMITY ROM:  ?  ?Passive ROM Right ?12/13/2021 Left ?12/13/2021 Left PROM ?01/17/22  ?Shoulder flexion   90d 135d  ?Shoulder extension       ?Shoulder abduction   45d 90d  ?Shoulder adduction       ?Shoulder internal rotation   70d 75d  ?Shoulder external rotation   20d 30d  ?Elbow flexion       ?Elbow extension       ?Wrist flexion       ?Wrist extension       ?Wrist ulnar deviation       ?Wrist radial deviation       ?Wrist  pronation       ?Wrist supination       ?(Blank rows = not tested) ?  ?UPPER EXTREMITY MMT: ?  ?MMT Right ?12/13/2021 Left ?12/13/2021 Left ?01/17/22  ?Shoulder flexion   * 3  ?Shoulder extension   * 3  ?Shoulder abduction   * 3  ?Shoulder adduction   * 3  ?Shoulder internal rotation   * 3  ?Shoulder external rotation   * 3  ?Middle trapezius     3  ?Lower trapezius     3  ?Elbow flexion   3   ?Elbow extension   3   ?Wrist flexion   4   ?Wrist extension   4   ?Wrist ulnar deviation   4   ?Wrist radial deviation   4   ?Wrist pronation       ?Wrist supination       ?Grip strength (lbs)       ?(* not tested due to post-op status) ?  ?SHOULDER SPECIAL TESTS: ?           N/A post-op ?JOINT MOBILITY TESTING:  ?N/A ?  ?PALPATION:  ?Guarding through L shoulder ?            ? ?TODAY'S TREATMENT:  ?[facility] Adult PT Treatment:                                                DATE: 01/17/22 ?Therapeutic Exercise: ?Supine press 15x ?Supine flexion 15x ?Sidelie ER 15x ?Sidelie ER 15x ?Prone flexion 15x ?Prone extension 15x ?Prone hor abd IR/ER 10/10x ?Prone scaption 15x ? ?Manual Therapy ? Posterior and inferior glenohumeral glides 3x10 each ? D1 F/E 15x w/PT assist ?  ? ?[facility] Adult PT Treatment:                                                DATE: 01/10/22 ?Therapeutic Exercise: ?OH pulleys, flexion to 90d 3 min ?Supine chest press 15x2 cane ?Seated ER 15x2 cane ?Supine abduction 15x2 cane ?Isometric F/E/ABD/ADD/IR/ER 3s hold 10x each ?Manual Therapy: ?Rhythmic stabilization at 90d flexion 30s x3 ?4 way scapula 20x ea. ? ?[facility] Adult PT Treatment:                                                DATE: 01/03/22 ?Therapeutic Exercise: ?Supine elbow FE holding tennis ball 15/15 ?  OH pulleys, flexion to 90d 3 min ?Supine chest press 15x2 cane ?Supine ER 15x2 cane ?Supine abduction 15x2 cane ?Isometric F/E/ABD/ADD/IR/ER 3s hold 10x each ?Manual Therapy: ?Rhythmic stabilization at 90d flexion 30s x3 ?4 way scapula 20x ea. ? ? ?  ?  ?  ?PATIENT  EDUCATION: ?Education details: Discussed eval findings, rehab rationale and POC and patient is in agreement ?Person educated: Patient ?Education method: Explanation and Demonstration ?Education comprehension: verbalized understanding and needs further education ?  ?  ?HOME EXERCISE PROGRAM: ?Access Code: [email or web address] ?URL: [email or web address] ?Date: 12/21/2021 ?Prepared by: Sharlynn Oliphant ?  ?Exercises ?- Isometric Shoulder Flexion at Wall  - 2 x daily - 5 x weekly - 1 sets - 10 reps - 3 hold ?- Isometric Shoulder Abduction at Wall  - 2 x daily - 5 x weekly - 1 sets - 10 reps - 3 hold ?- Isometric Shoulder External Rotation at Wall  - 2 x daily - 5 x weekly - 1 sets - 10 reps - 3 hold ?- Standing Isometric Shoulder Internal Rotation at Doorway  - 2 x daily - 5 x weekly - 1 sets - 10 reps - 3 hold ?  ?ASSESSMENT: ?  ?CLINICAL IMPRESSION:  ?Patient is now at 6 week post-op mark, overall pain minimal.  Sling has been DCed by MD and has been cleared to regain full ROM but to refrain from resisted strengthening until 8 week mark.  PROM has increased, strength grades obtained. Patient able to demonstrate improved shoulder mobility w/o any increase in discomfort.  ?  ?OBJECTIVE IMPAIRMENTS decreased knowledge of use of DME, decreased ROM, decreased strength, impaired UE functional use, postural dysfunction, and pain.  ?  ?ACTIVITY LIMITATIONS cleaning, driving, meal prep, and occupation.  ?  ?PERSONAL FACTORS Age and Time since onset of injury/illness/exacerbation are also affecting patient's functional outcome.  ?  ?  ?REHAB POTENTIAL: Good ?  ?CLINICAL DECISION MAKING: Stable/uncomplicated ?  ?EVALUATION COMPLEXITY: Moderate ?  ?  ?GOALS: ?Goals reviewed with patient? Yes ?  ?SHORT TERM GOALS: Target date: 12/27/2021 ?  ?Patient to demonstrate independence in HEP  ?[id number] ?Goal status: Met ?  ?2.  Maintain PROM to 90d flexion, 45d abd, 20d ER at side, IR to abdomen ?Baseline: 90d flexion, 45d abd,  20d ER at side, IR to abdomen ?Goal status: Met ?  ?  ?  ?LONG TERM GOALS: Target date: 01/10/2022 ?  ?4/10 worst pain ?Baseline: 2/10 worst pain ?Goal status: Met ?  ?2.  AROM to 160d flexion, 160d abd, 70d ER at side, IR

## 2022-01-21 ENCOUNTER — Ambulatory Visit: Payer: Medicaid Other | Admitting: Nurse Practitioner

## 2022-01-24 ENCOUNTER — Ambulatory Visit: Payer: Medicaid Other

## 2022-01-26 ENCOUNTER — Ambulatory Visit: Payer: Medicaid Other

## 2022-01-26 DIAGNOSIS — M25512 Pain in left shoulder: Secondary | ICD-10-CM

## 2022-01-26 DIAGNOSIS — M6281 Muscle weakness (generalized): Secondary | ICD-10-CM | POA: Diagnosis not present

## 2022-01-26 DIAGNOSIS — S43432A Superior glenoid labrum lesion of left shoulder, initial encounter: Secondary | ICD-10-CM

## 2022-01-26 NOTE — Therapy (Signed)
OUTPATIENT PHYSICAL THERAPY TREATMENT NOTE   Patient Name: Olivia Kelly MRN: [id number] DOB:10-Feb-1986, 36 y.o., female Today's Date: 01/26/2022  PCP: Patient, No Pcp Per (Inactive) REFERRING PROVIDER: Hiram Gash, MD  END OF SESSION:   PT End of Session - 01/26/22 1220     Visit Number 7    Number of Visits 8    Date for PT Re-Evaluation 02/07/22    Authorization Type [place] MCD    PT Start Time 1220    PT Stop Time 1300    PT Time Calculation (min) 40 min    Activity Tolerance Patient tolerated treatment well    Behavior During Therapy [employer] for tasks assessed/performed                Past Medical History:  Diagnosis Date   Anemia due to blood loss, acute 04/03/2016   History of COVID-19 12/12/2019   Seizures ([place])    last seizure 2015, on meds   Past Surgical History:  Procedure Laterality Date   cyst removed middle of chest at 72-8 years old     [place], URETEROSCOPY AND STENT PLACEMENT Left 10/29/2014   Procedure: CYSTOSCOPY WITH LEFT RETROGRADE PYELOGRAM,  DIAGNOSTIC LEFT  URETEROSCOPY AND STENT PLACEMENT;  Surgeon: Alexis Frock, MD;  Location: [facility];  Service: Urology;  Laterality: Left;   CYSTOSCOPY WITH RETROGRADE PYELOGRAM, URETEROSCOPY AND STENT PLACEMENT Left 01/23/2015   Procedure: CYSTOSCOPY WITH LEFT RETROGRADE PYELOGRAM/URETERAL STENT PLACEMENT;  Surgeon: Alexis Frock, MD;  Location: [facility];  Service: Urology;  Laterality: Left;   IUD REMOVAL N/A 06/09/2015   Procedure: INTRAUTERINE DEVICE (IUD) REMOVAL;  Surgeon: Emily Filbert, MD;  Location: [facility];  Service: Gynecology;  Laterality: N/A;   NEPHRECTOMY     half of left kidney removed   ROBOT ASSISTED PYELOPLASTY Left 01/23/2015   Procedure: ROBOTIC ASSISTED PYELOPLASTY WITH STENT PLACEMENT;  Surgeon: Alexis Frock, MD;  Location: [facility];  Service: Urology;  Laterality: Left;   SHOULDER ARTHROSCOPY WITH BANKART REPAIR Left 12/02/2021   Procedure:  Arthroscopy Shoulder with Extensive Debridement Capsulorrhaphy and Superior Labrum Anterior-Posterior/Bankart Repair;  Surgeon: Hiram Gash, MD;  Location: [place];  Service: Orthopedics;  Laterality: Left;   Patient Active Problem List   Diagnosis Date Noted   Generalized anxiety disorder 09/01/2020   History of COVID-19 12/12/2019   UPJ obstruction, 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  PAIN:  Are you having pain? Yes: NPRS scale: 3/10 Pain location: L shoulder Pain description: ache Aggravating factors: motion and positions Relieving factors: meds   OBJECTIVE: (objective measures completed at initial evaluation unless otherwise dated)   OBJECTIVE:    PATIENT SURVEYS:  Quick Dash 34   COGNITION:           Overall cognitive status: Within functional limits for tasks assessed  SENSATION: WFL   POSTURE: Rounded shoulders   UPPER EXTREMITY ROM:    Passive ROM Right 12/13/2021 Left 12/13/2021 Left PROM 01/17/22  Shoulder flexion   90d 135d  Shoulder extension       Shoulder abduction   45d 90d  Shoulder adduction       Shoulder internal rotation   70d 75d  Shoulder external rotation   20d 30d  Elbow flexion       Elbow extension       Wrist flexion       Wrist extension       Wrist ulnar deviation       Wrist radial deviation       Wrist pronation        Wrist supination       (Blank rows = not tested)   UPPER EXTREMITY MMT:   MMT Right 12/13/2021 Left 12/13/2021 Left 01/17/22  Shoulder flexion   * 3  Shoulder extension   * 3  Shoulder abduction   * 3  Shoulder adduction   * 3  Shoulder internal rotation   * 3  Shoulder external rotation   * 3  Middle trapezius     3  Lower trapezius     3  Elbow flexion   3   Elbow extension   3   Wrist flexion   4   Wrist extension   4   Wrist ulnar deviation   4   Wrist radial deviation   4   Wrist pronation       Wrist supination       Grip strength (lbs)       (* not tested due to post-op status)   SHOULDER SPECIAL TESTS:            N/A post-op JOINT MOBILITY TESTING:  N/A   PALPATION:  Guarding through L shoulder              TODAY'S TREATMENT:  [facility] Adult PT Treatment:                                                DATE: 01/26/22 Therapeutic Exercise: Supine press 15x (green ball) Supine flexion 15x(green ball) Sidelie ER 15x(green ball) Sidelie ABD 15x(green ball) Prone flexion 15x(green ball) Prone extension 15x(green ball) Prone hor abd IR/ER 10/10x(green ball) Prone scaption 15x(green ball) Seated scaption UBE L1 3/3 min Manual Therapy: Posterior and inferior glenohumeral glides 3x10 each D1 F/E 15x w/PT assist  [facility] Adult PT Treatment:                                                DATE: 01/17/22 Therapeutic Exercise: Supine press 15x Supine flexion 15x Sidelie ER 15x Sidelie ER 15x Prone flexion 15x Prone extension 15x Prone hor abd IR/ER 10/10x Prone scaption 15x  Manual Therapy  Posterior and inferior glenohumeral glides 3x10 each  D1 F/E 15x w/PT assist    [facility] Adult PT Treatment:  DATE: 01/10/22 Therapeutic Exercise: OH pulleys, flexion to 90d 3 min Supine chest press 15x2 cane Seated ER 15x2 cane Supine abduction 15x2 cane Isometric F/E/ABD/ADD/IR/ER 3s hold 10x each Manual Therapy: Rhythmic  stabilization at 90d flexion 30s x3 4 way scapula 20x ea.    PATIENT EDUCATION: Education details: Discussed eval findings, rehab rationale and POC and patient is in agreement Person educated: Patient Education method: Customer service manager Education comprehension: verbalized understanding and needs further education     HOME EXERCISE PROGRAM: Access Code: [email or web address] URL: [email or web address] Date: 12/21/2021 Prepared by: Sharlynn Oliphant   Exercises - Isometric Shoulder Flexion at Wall  - 2 x daily - 5 x weekly - 1 sets - 10 reps - 3 hold - Isometric Shoulder Abduction at Wall  - 2 x daily - 5 x weekly - 1 sets - 10 reps - 3 hold - Isometric Shoulder External Rotation at Wall  - 2 x daily - 5 x weekly - 1 sets - 10 reps - 3 hold - Standing Isometric Shoulder Internal Rotation at Doorway  - 2 x daily - 5 x weekly - 1 sets - 10 reps - 3 hold   ASSESSMENT:   CLINICAL IMPRESSION:  Good response noted from treatments, overall shoulder mobility improving and pain levels are minimal.  Patient is ~ 7 weeks post-op. Used light weight ball a resistance today and provide proprioceptive awareness.  40d ER and 160d abduction in scapular plane noted.  ROM limited by soft tissue tightness and muscle guarding vs. Adhesions   OBJECTIVE IMPAIRMENTS decreased knowledge of use of DME, decreased ROM, decreased strength, impaired UE functional use, postural dysfunction, and pain.    ACTIVITY LIMITATIONS cleaning, driving, meal prep, and occupation.    PERSONAL FACTORS Age and Time since onset of injury/illness/exacerbation are also affecting patient's functional outcome.      REHAB POTENTIAL: Good   CLINICAL DECISION MAKING: Stable/uncomplicated   EVALUATION COMPLEXITY: Moderate     GOALS: Goals reviewed with patient? Yes   SHORT TERM GOALS: Target date: 12/27/2021   Patient to demonstrate independence in HEP  Baseline:[id number] Goal status: Met   2.  Maintain PROM to  90d flexion, 45d abd, 20d ER at side, IR to abdomen Baseline: 90d flexion, 45d abd, 20d ER at side, IR to abdomen Goal status: Met       LONG TERM GOALS: Target date: 03/19/2022   4/10 worst pain Baseline: 2/10 worst pain Goal status: Met   2.  AROM to 160d flexion, 160d abd, 70d ER at side, IR behind back Baseline: 135d flexion, 90d abd, 30d ER, IR 70d Goal status: Ongoing   3.  Increase L shoulder strength to 4/5 all planes Baseline: L shoulder strength 3/5 all planes Goal status: Ongoing   4.  Increase DASH score to 60/80 Baseline: 34 Goal status: Ongoing  5. Restore normal posture and scapular mechanics  Baseline: Scapulohumeral restrictions noted beginning at 90d abduction  Goal status: Ongoing       PLAN: PT FREQUENCY: 2x/week   PT DURATION: 6 weeks   PLANNED INTERVENTIONS: Therapeutic exercises, Therapeutic activity, Neuromuscular re-education, Balance training, Gait training, Patient/Family education, Joint mobilization, DME instructions, and Manual therapy   PLAN FOR NEXT SESSION: review HEP, add isometrics per protocol, ROM, rhythmic stabilization, A/PROM, scapulothoracic mobility and stabilization, HEP update    Lanice Shirts, PT 01/26/2022, 12:21 PM   Check all possible CPT codes: 07867 - Re-evaluation, 97110- Therapeutic Exercise, [phone]- Neuro Re-education, [phone] - Gait Training, [phone] -  Manual Therapy, 97530 - Therapeutic Activities, and 97535 - Self Care     If treatment provided at initial evaluation, no treatment charged due to lack of authorization.

## 2022-02-02 ENCOUNTER — Ambulatory Visit: Payer: Medicaid Other

## 2022-02-02 DIAGNOSIS — S43432A Superior glenoid labrum lesion of left shoulder, initial encounter: Secondary | ICD-10-CM

## 2022-02-02 DIAGNOSIS — M6281 Muscle weakness (generalized): Secondary | ICD-10-CM

## 2022-02-02 DIAGNOSIS — M25512 Pain in left shoulder: Secondary | ICD-10-CM

## 2022-02-02 NOTE — Therapy (Signed)
OUTPATIENT PHYSICAL THERAPY TREATMENT NOTE   Patient Name: Olivia Kelly MRN: [id number] DOB:1986-01-22, 36 y.o., female Today's Date: 02/02/2022  PCP: Patient, No Pcp Per (Inactive) REFERRING PROVIDER: Hiram Gash, MD  END OF SESSION:   PT End of Session - 02/02/22 1132     Visit Number 8    Number of Visits 8    Date for PT Re-Evaluation 02/07/22    Authorization Type [place] MCD    PT Start Time 1130    PT Stop Time 1210    PT Time Calculation (min) 40 min    Activity Tolerance Patient tolerated treatment well    Behavior During Therapy [facility] for tasks assessed/performed                Past Medical History:  Diagnosis Date   Anemia due to blood loss, acute 04/03/2016   History of COVID-19 12/12/2019   Seizures ([place])    last seizure 2015, on meds   Past Surgical History:  Procedure Laterality Date   cyst removed middle of chest at 47-42 years old     [place], URETEROSCOPY AND STENT PLACEMENT Left 10/29/2014   Procedure: CYSTOSCOPY WITH LEFT RETROGRADE PYELOGRAM,  DIAGNOSTIC LEFT  URETEROSCOPY AND STENT PLACEMENT;  Surgeon: Alexis Frock, MD;  Location: [facility];  Service: Urology;  Laterality: Left;   CYSTOSCOPY WITH RETROGRADE PYELOGRAM, URETEROSCOPY AND STENT PLACEMENT Left 01/23/2015   Procedure: CYSTOSCOPY WITH LEFT RETROGRADE PYELOGRAM/URETERAL STENT PLACEMENT;  Surgeon: Alexis Frock, MD;  Location: [facility];  Service: Urology;  Laterality: Left;   IUD REMOVAL N/A 06/09/2015   Procedure: INTRAUTERINE DEVICE (IUD) REMOVAL;  Surgeon: Emily Filbert, MD;  Location: [facility];  Service: Gynecology;  Laterality: N/A;   NEPHRECTOMY     half of left kidney removed   ROBOT ASSISTED PYELOPLASTY Left 01/23/2015   Procedure: ROBOTIC ASSISTED PYELOPLASTY WITH STENT PLACEMENT;  Surgeon: Alexis Frock, MD;  Location: [facility];  Service: Urology;  Laterality: Left;   SHOULDER ARTHROSCOPY WITH BANKART REPAIR Left 12/02/2021   Procedure:  Arthroscopy Shoulder with Extensive Debridement Capsulorrhaphy and Superior Labrum Anterior-Posterior/Bankart Repair;  Surgeon: Hiram Gash, MD;  Location: [place];  Service: Orthopedics;  Laterality: Left;   Patient Active Problem List   Diagnosis Date Noted   Generalized anxiety disorder 09/01/2020   History of COVID-19 12/12/2019   UPJ obstruction, 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  PAIN:  Are you having pain? Yes: NPRS scale: 3/10 Pain location: L shoulder Pain description: ache Aggravating factors: motion and positions Relieving factors: meds   OBJECTIVE: (objective measures completed at initial evaluation unless otherwise dated)   OBJECTIVE:    PATIENT SURVEYS:  Quick Dash 34   COGNITION:           Overall cognitive status: Within functional limits for tasks assessed  SENSATION: WFL   POSTURE: Rounded shoulders   UPPER EXTREMITY ROM:    Passive ROM Right 12/13/2021 Left 12/13/2021 Left PROM 01/17/22  Shoulder flexion   90d 135d  Shoulder extension       Shoulder abduction   45d 90d  Shoulder adduction       Shoulder internal rotation   70d 75d  Shoulder external rotation   20d 30d  Elbow flexion       Elbow extension       Wrist flexion       Wrist extension       Wrist ulnar deviation       Wrist radial deviation       Wrist pronation       Wrist  supination       (Blank rows = not tested)   UPPER EXTREMITY MMT:   MMT Right 12/13/2021 Left 12/13/2021 Left 01/17/22  Shoulder flexion   * 3  Shoulder extension   * 3  Shoulder abduction   * 3  Shoulder adduction   * 3  Shoulder internal rotation   * 3  Shoulder external rotation   * 3  Middle trapezius     3  Lower trapezius     3  Elbow flexion   3   Elbow extension   3   Wrist flexion   4   Wrist extension   4   Wrist ulnar deviation   4   Wrist radial deviation   4   Wrist pronation       Wrist supination       Grip strength (lbs)       (* not tested due to post-op status)   SHOULDER SPECIAL TESTS:            N/A post-op JOINT MOBILITY TESTING:  N/A   PALPATION:  Guarding through L shoulder              TODAY'S TREATMENT:  [facility] Adult PT Treatment:                                                DATE: 02/02/22 Therapeutic Exercise: Supine press 15x (red ball) Supine flexion 15x(red ball) Sidelie ER 15x(red ball) Sidelie ABD 15x(red ball) Prone flexion 15x(red ball) Prone extension 15x(red ball) Prone hor abd IR/ER 10/10x(red ball) Prone scaption 15x(red ball) Seated scaption (red ball) UBE L1.5 3/3 min Scapular wall slides YTB 15x Manual Therapy: Posterior and inferior glenohumeral glides 3x10 each D1 F/E 15x w/PT assist  [facility] Adult PT Treatment:                                                DATE: 01/26/22 Therapeutic Exercise: Supine press 15x (green ball) Supine flexion 15x(green ball) Sidelie ER 15x(green ball) Sidelie ABD 15x(green ball) Prone flexion 15x(green ball) Prone extension 15x(green ball) Prone hor abd IR/ER 10/10x(green ball) Prone scaption 15x(green ball) Seated scaption UBE L1 3/3 min Manual Therapy: Posterior and inferior glenohumeral glides 3x10 each D1 F/E 15x w/PT assist  [facility] Adult PT Treatment:  DATE: 01/17/22 Therapeutic Exercise: Supine press 15x Supine flexion 15x Sidelie  ER 15x Sidelie ER 15x Prone flexion 15x Prone extension 15x Prone hor abd IR/ER 10/10x Prone scaption 15x  Manual Therapy  Posterior and inferior glenohumeral glides 3x10 each  D1 F/E 15x w/PT assist      PATIENT EDUCATION: Education details: Discussed eval findings, rehab rationale and POC and patient is in agreement Person educated: Patient Education method: Explanation and Demonstration Education comprehension: verbalized understanding and needs further education     HOME EXERCISE PROGRAM: Access Code: [email or web address] URL: [email or web address] Date: 02/02/2022 Prepared by: Sharlynn Oliphant  Exercises - Isometric Shoulder Flexion at Wall  - 2 x daily - 5 x weekly - 1 sets - 10 reps - 3 hold - Isometric Shoulder Abduction at Wall  - 2 x daily - 5 x weekly - 1 sets - 10 reps - 3 hold - Isometric Shoulder External Rotation at Wall  - 2 x daily - 5 x weekly - 1 sets - 10 reps - 3 hold - Standing Isometric Shoulder Internal Rotation at Doorway  - 2 x daily - 5 x weekly - 1 sets - 10 reps - 3 hold - Scapular Wall Slides  - 2 x daily - 7 x weekly - 1 sets - 15 reps   ASSESSMENT:   CLINICAL IMPRESSION:  Increased weight/resistance w/o any difficulty noted, added scapular wall slides to promote scapular depression and functional shoulder mechanics as patient hikes L shoulder with OH reaching tasks   OBJECTIVE IMPAIRMENTS decreased knowledge of use of DME, decreased ROM, decreased strength, impaired UE functional use, postural dysfunction, and pain.    ACTIVITY LIMITATIONS cleaning, driving, meal prep, and occupation.    PERSONAL FACTORS Age and Time since onset of injury/illness/exacerbation are also affecting patient's functional outcome.      REHAB POTENTIAL: Good   CLINICAL DECISION MAKING: Stable/uncomplicated   EVALUATION COMPLEXITY: Moderate     GOALS: Goals reviewed with patient? Yes   SHORT TERM GOALS: Target date: 12/27/2021   Patient to demonstrate  independence in HEP  Baseline:[id number] Goal status: Met   2.  Maintain PROM to 90d flexion, 45d abd, 20d ER at side, IR to abdomen Baseline: 90d flexion, 45d abd, 20d ER at side, IR to abdomen Goal status: Met       LONG TERM GOALS: Target date: 03/19/2022   4/10 worst pain Baseline: 2/10 worst pain Goal status: Met   2.  AROM to 160d flexion, 160d abd, 70d ER at side, IR behind back Baseline: 135d flexion, 90d abd, 30d ER, IR 70d Goal status: Ongoing   3.  Increase L shoulder strength to 4/5 all planes Baseline: L shoulder strength 3/5 all planes Goal status: Ongoing   4.  Increase DASH score to 60/80 Baseline: 34 Goal status: Ongoing  5. Restore normal posture and scapular mechanics  Baseline: Scapulohumeral restrictions noted beginning at 90d abduction  Goal status: Ongoing       PLAN: PT FREQUENCY: 2x/week   PT DURATION: 6 weeks   PLANNED INTERVENTIONS: Therapeutic exercises, Therapeutic activity, Neuromuscular re-education, Balance training, Gait training, Patient/Family education, Joint mobilization, DME instructions, and Manual therapy   PLAN FOR NEXT SESSION: review HEP, add isometrics per protocol, ROM, rhythmic stabilization, A/PROM, scapulothoracic mobility and stabilization, HEP update promoting scapular depression    Lanice Shirts, PT 02/02/2022, 12:18 PM   Check all possible CPT codes: 54098 - Re-evaluation, 97110- Therapeutic Exercise, [phone]- Neuro Re-education, [phone] - Gait Training, [phone] - Manual  Therapy, 97530 - Therapeutic Activities, and [phone] - Self Care     If treatment provided at initial evaluation, no treatment charged due to lack of authorization.

## 2022-02-07 ENCOUNTER — Ambulatory Visit: Payer: Medicaid Other | Attending: Orthopaedic Surgery

## 2022-02-07 DIAGNOSIS — M25512 Pain in left shoulder: Secondary | ICD-10-CM | POA: Insufficient documentation

## 2022-02-07 DIAGNOSIS — M6281 Muscle weakness (generalized): Secondary | ICD-10-CM | POA: Insufficient documentation

## 2022-02-07 DIAGNOSIS — S43432A Superior glenoid labrum lesion of left shoulder, initial encounter: Secondary | ICD-10-CM | POA: Insufficient documentation

## 2022-02-07 NOTE — Therapy (Signed)
OUTPATIENT PHYSICAL THERAPY TREATMENT NOTE   Patient Name: Olivia Kelly MRN: [id number] DOB:09-02-1986, 36 y.o., female Today's Date: 02/07/2022  PCP: Patient, No Pcp Per (Inactive) REFERRING PROVIDER: Hiram Gash, MD  END OF SESSION:   PT End of Session - 02/07/22 1137     Visit Number 9    Number of Visits 14    Date for PT Re-Evaluation 02/07/22    Authorization Type [place] [employer]    PT Start Time 1137    PT Stop Time 1215    PT Time Calculation (min) 38 min    Activity Tolerance Patient tolerated treatment well    Behavior During Therapy WFL for tasks assessed/performed                Past Medical History:  Diagnosis Date   Anemia due to blood loss, acute 04/03/2016   History of COVID-19 12/12/2019   Seizures ([place])    last seizure 2015, on meds   Past Surgical History:  Procedure Laterality Date   cyst removed middle of chest at 74-53 years old     [place], URETEROSCOPY AND STENT PLACEMENT Left 10/29/2014   Procedure: CYSTOSCOPY WITH LEFT RETROGRADE PYELOGRAM,  DIAGNOSTIC LEFT  URETEROSCOPY AND STENT PLACEMENT;  Surgeon: Alexis Frock, MD;  Location: [facility];  Service: Urology;  Laterality: Left;   CYSTOSCOPY WITH RETROGRADE PYELOGRAM, URETEROSCOPY AND STENT PLACEMENT Left 01/23/2015   Procedure: CYSTOSCOPY WITH LEFT RETROGRADE PYELOGRAM/URETERAL STENT PLACEMENT;  Surgeon: Alexis Frock, MD;  Location: [facility];  Service: Urology;  Laterality: Left;   IUD REMOVAL N/A 06/09/2015   Procedure: INTRAUTERINE DEVICE (IUD) REMOVAL;  Surgeon: Emily Filbert, MD;  Location: [place] [facility];  Service: Gynecology;  Laterality: N/A;   NEPHRECTOMY     half of left kidney removed   ROBOT ASSISTED PYELOPLASTY Left 01/23/2015   Procedure: ROBOTIC ASSISTED PYELOPLASTY WITH STENT PLACEMENT;  Surgeon: Alexis Frock, MD;  Location: [facility];  Service: Urology;  Laterality: Left;   SHOULDER ARTHROSCOPY WITH BANKART REPAIR Left 12/02/2021   Procedure:  Arthroscopy Shoulder with Extensive Debridement Capsulorrhaphy and Superior Labrum Anterior-Posterior/Bankart Repair;  Surgeon: Hiram Gash, MD;  Location: [place];  Service: Orthopedics;  Laterality: Left;   Patient Active Problem List   Diagnosis Date Noted   Generalized anxiety disorder 09/01/2020   History of COVID-19 12/12/2019   UPJ obstruction, 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  PAIN:  Are you having pain? Yes: NPRS scale: 3/10 Pain location: L shoulder Pain description: ache Aggravating factors: motion and positions Relieving factors: meds   OBJECTIVE: (objective measures completed at initial evaluation unless otherwise dated)   OBJECTIVE:    PATIENT SURVEYS:  Quick Dash 34   COGNITION:           Overall cognitive status: Within functional limits for tasks assessed  SENSATION: WFL   POSTURE: Rounded shoulders   UPPER EXTREMITY ROM:    Passive ROM Right 12/13/2021 Left 12/13/2021 Left PROM 01/17/22  Shoulder flexion   90d 135d  Shoulder extension       Shoulder abduction   45d 90d  Shoulder adduction       Shoulder internal rotation   70d 75d  Shoulder external rotation   20d 30d  Elbow flexion       Elbow extension       Wrist flexion       Wrist extension       Wrist ulnar deviation       Wrist radial deviation       Wrist pronation        Wrist supination       (Blank rows = not tested)   UPPER EXTREMITY MMT:   MMT Right 12/13/2021 Left 12/13/2021 Left 01/17/22  Shoulder flexion   * 3  Shoulder extension   * 3  Shoulder abduction   * 3  Shoulder adduction   * 3  Shoulder internal rotation   * 3  Shoulder external rotation   * 3  Middle trapezius     3  Lower trapezius     3  Elbow flexion   3   Elbow extension   3   Wrist flexion   4   Wrist extension   4   Wrist ulnar deviation   4   Wrist radial deviation   4   Wrist pronation       Wrist supination       Grip strength (lbs)       (* not tested due to post-op status)   SHOULDER SPECIAL TESTS:            N/A post-op JOINT MOBILITY TESTING:  N/A   PALPATION:  Guarding through L shoulder              TODAY'S TREATMENT:  [facility] Adult PT Treatment:                                                DATE: 02/07/22 Therapeutic Exercise: Supine press 15x (red ball) Supine flexion 15x(red ball) Sidelie ER 15x(red ball) Sidelie ABD 15x(red ball) Prone flexion 15x(red ball) Prone extension 15x(red ball) Prone hor abd IR/ER 10/10x(red ball) Prone scaption 15x(red ball) Seated scaption (red ball) UBE L1.5 3/3 min Scapular wall slides YTB 15x Scaption with mirror Manual Therapy: Posterior and inferior glenohumeral glides 3x10 each D1 F/E 15x w/PT assist  [facility] Adult PT Treatment:                                                DATE: 02/02/22 Therapeutic Exercise: Supine press 15x 2# Supine flexion 15x2# Sidelie ER 15x2# Sidelie ABD 15x2# Prone flexion 15x2# Prone extension 15x2 Prone hor abd IR/ER 10/10x2# Prone scaption 15x2# Seated scaption 2# 2# UBE L1.5 3/3 min Scapular wall slides YTB 15x Manual Therapy: Posterior and inferior glenohumeral glides 3x10 each D1 F/E 15x w/PT assist  [facility] Adult PT Treatment:  DATE: 01/26/22 Therapeutic Exercise: Supine press 15x (green ball) Supine flexion 15x(green  ball) Sidelie ER 15x(green ball) Sidelie ABD 15x(green ball) Prone flexion 15x(green ball) Prone extension 15x(green ball) Prone hor abd IR/ER 10/10x(green ball) Prone scaption 15x(green ball) Seated scaption UBE L1 3/3 min Manual Therapy: Posterior and inferior glenohumeral glides 3x10 each D1 F/E 15x w/PT assist  [facility] Adult PT Treatment:                                                DATE: 01/17/22 Therapeutic Exercise: Supine press 15x Supine flexion 15x Sidelie ER 15x Sidelie ER 15x Prone flexion 15x Prone extension 15x Prone hor abd IR/ER 10/10x Prone scaption 15x  Manual Therapy  Posterior and inferior glenohumeral glides 3x10 each  D1 F/E 15x w/PT assist      PATIENT EDUCATION: Education details: Discussed eval findings, rehab rationale and POC and patient is in agreement Person educated: Patient Education method: Explanation and Demonstration Education comprehension: verbalized understanding and needs further education     HOME EXERCISE PROGRAM: Access Code: [email or web address] URL: [email or web address] Date: 02/02/2022 Prepared by: Sharlynn Oliphant  Exercises - Isometric Shoulder Flexion at Wall  - 2 x daily - 5 x weekly - 1 sets - 10 reps - 3 hold - Isometric Shoulder Abduction at Wall  - 2 x daily - 5 x weekly - 1 sets - 10 reps - 3 hold - Isometric Shoulder External Rotation at Wall  - 2 x daily - 5 x weekly - 1 sets - 10 reps - 3 hold - Standing Isometric Shoulder Internal Rotation at Doorway  - 2 x daily - 5 x weekly - 1 sets - 10 reps - 3 hold - Scapular Wall Slides  - 2 x daily - 7 x weekly - 1 sets - 15 reps   ASSESSMENT:   CLINICAL IMPRESSION:  Increased resistance on UBE, advanced to dumbbells for resisted tasks.  Improved scapulohumeral rhythm noted today as well as increased AROM.  Added scaption in front of mirror to control hiking and restore proper mechanics  OBJECTIVE IMPAIRMENTS decreased knowledge of use of DME, decreased ROM,  decreased strength, impaired UE functional use, postural dysfunction, and pain.    ACTIVITY LIMITATIONS cleaning, driving, meal prep, and occupation.    PERSONAL FACTORS Age and Time since onset of injury/illness/exacerbation are also affecting patient's functional outcome.      REHAB POTENTIAL: Good   CLINICAL DECISION MAKING: Stable/uncomplicated   EVALUATION COMPLEXITY: Moderate     GOALS: Goals reviewed with patient? Yes   SHORT TERM GOALS: Target date: 12/27/2021   Patient to demonstrate independence in HEP  Baseline:[id number] Goal status: Met   2.  Maintain PROM to 90d flexion, 45d abd, 20d ER at side, IR to abdomen Baseline: 90d flexion, 45d abd, 20d ER at side, IR to abdomen Goal status: Met       LONG TERM GOALS: Target date: 03/19/2022   4/10 worst pain Baseline: 2/10 worst pain Goal status: Met   2.  AROM to 160d flexion, 160d abd, 70d ER at side, IR behind back Baseline: 135d flexion, 90d abd, 30d ER, IR 70d Goal status: Ongoing   3.  Increase L shoulder strength to 4/5 all planes Baseline: L shoulder strength 3/5 all planes Goal status: Ongoing   4.  Increase DASH score to 60/80 Baseline: 34 Goal status: Ongoing  5. Restore normal posture and scapular mechanics  Baseline: Scapulohumeral restrictions noted beginning at 90d abduction  Goal status: Ongoing       PLAN: PT FREQUENCY: 2x/week   PT DURATION: 6 weeks   PLANNED INTERVENTIONS: Therapeutic exercises, Therapeutic activity, Neuromuscular re-education, Balance training, Gait training, Patient/Family education, Joint mobilization, DME instructions, and Manual therapy   PLAN FOR NEXT SESSION: review HEP, add isometrics per protocol, ROM, rhythmic stabilization, A/PROM, scapulothoracic mobility and stabilization, HEP update promoting scapular depression    Lanice Shirts, PT 02/07/2022, 11:38 AM   Check all possible CPT codes: 08811 - Re-evaluation, 97110- Therapeutic Exercise, [phone]- Neuro  Re-education, [phone] - Gait Training, [phone] - Manual Therapy, 97530 - Therapeutic Activities, and 97535 - Self Care     If treatment provided at initial evaluation, no treatment charged due to lack of authorization.

## 2022-02-11 ENCOUNTER — Ambulatory Visit: Payer: Medicaid Other | Admitting: Physician Assistant

## 2022-02-14 ENCOUNTER — Ambulatory Visit: Payer: Medicaid Other

## 2022-02-14 DIAGNOSIS — S43432A Superior glenoid labrum lesion of left shoulder, initial encounter: Secondary | ICD-10-CM | POA: Diagnosis present

## 2022-02-14 DIAGNOSIS — M25512 Pain in left shoulder: Secondary | ICD-10-CM | POA: Diagnosis present

## 2022-02-14 DIAGNOSIS — M6281 Muscle weakness (generalized): Secondary | ICD-10-CM | POA: Diagnosis present

## 2022-02-14 NOTE — Therapy (Signed)
OUTPATIENT PHYSICAL THERAPY TREATMENT NOTE   Patient Name: Olivia Kelly MRN: [id number] DOB:October 19, 1985, 36 y.o., female Today's Date: 02/14/2022  PCP: Patient, No Pcp Per (Inactive) REFERRING PROVIDER: Hiram Gash, MD  END OF SESSION:   PT End of Session - 02/14/22 1447     Visit Number 10    Number of Visits 14    Date for PT Re-Evaluation 02/07/22    Authorization Type [place] MCD    PT Start Time 1450    PT Stop Time 1530    PT Time Calculation (min) 40 min    Activity Tolerance Patient tolerated treatment well    Behavior During Therapy [facility] for tasks assessed/performed                Past Medical History:  Diagnosis Date   Anemia due to blood loss, acute 04/03/2016   History of COVID-19 12/12/2019   Seizures ([place])    last seizure 2015, on meds   Past Surgical History:  Procedure Laterality Date   cyst removed middle of chest at 1-39 years old     [place], URETEROSCOPY AND STENT PLACEMENT Left 10/29/2014   Procedure: CYSTOSCOPY WITH LEFT RETROGRADE PYELOGRAM,  DIAGNOSTIC LEFT  URETEROSCOPY AND STENT PLACEMENT;  Surgeon: Alexis Frock, MD;  Location: [facility];  Service: Urology;  Laterality: Left;   CYSTOSCOPY WITH RETROGRADE PYELOGRAM, URETEROSCOPY AND STENT PLACEMENT Left 01/23/2015   Procedure: CYSTOSCOPY WITH LEFT RETROGRADE PYELOGRAM/URETERAL STENT PLACEMENT;  Surgeon: Alexis Frock, MD;  Location: [facility];  Service: Urology;  Laterality: Left;   IUD REMOVAL N/A 06/09/2015   Procedure: INTRAUTERINE DEVICE (IUD) REMOVAL;  Surgeon: Emily Filbert, MD;  Location: [place] [facility];  Service: Gynecology;  Laterality: N/A;   NEPHRECTOMY     half of left kidney removed   ROBOT ASSISTED PYELOPLASTY Left 01/23/2015   Procedure: ROBOTIC ASSISTED PYELOPLASTY WITH STENT PLACEMENT;  Surgeon: Alexis Frock, MD;  [facility];  Service: Urology;  Laterality: Left;   SHOULDER ARTHROSCOPY WITH BANKART REPAIR Left 12/02/2021   Procedure:  Arthroscopy Shoulder with Extensive Debridement Capsulorrhaphy and Superior Labrum Anterior-Posterior/Bankart Repair;  Surgeon: Hiram Gash, MD;  Location: [place];  Service: Orthopedics;  Laterality: Left;   Patient Active Problem List   Diagnosis Date Noted   Generalized anxiety disorder 09/01/2020   History of COVID-19 12/12/2019   UPJ obstruction, 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 Yes: NPRS scale: 3/10 Pain location: L shoulder Pain description: ache Aggravating factors: motion and positions Relieving factors: meds   OBJECTIVE: (objective measures completed at initial evaluation unless otherwise dated)   OBJECTIVE:    PATIENT SURVEYS:  Quick Dash 34   COGNITION:           Overall cognitive status: Within functional limits for tasks assessed  SENSATION: WFL   POSTURE: Rounded shoulders   UPPER EXTREMITY ROM:    Passive ROM Right 12/13/2021 Left 12/13/2021 Left PROM 01/17/22  Shoulder flexion   90d 135d  Shoulder extension       Shoulder abduction   45d 90d  Shoulder adduction       Shoulder internal rotation   70d 75d  Shoulder external rotation   20d 30d  Elbow flexion       Elbow extension       Wrist flexion       Wrist extension       Wrist ulnar deviation       Wrist radial deviation       Wrist pronation       Wrist  supination       (Blank rows = not tested)   UPPER EXTREMITY MMT:   MMT Right 12/13/2021 Left 12/13/2021 Left 01/17/22  Shoulder flexion   * 3  Shoulder extension   * 3  Shoulder abduction   * 3  Shoulder adduction   * 3  Shoulder internal rotation   * 3  Shoulder external rotation   * 3  Middle trapezius     3  Lower trapezius     3  Elbow flexion   3   Elbow extension   3   Wrist flexion   4   Wrist extension   4   Wrist ulnar deviation   4   Wrist radial deviation   4   Wrist pronation       Wrist supination       Grip strength (lbs)       (* not tested due to post-op status)   SHOULDER SPECIAL TESTS:            N/A post-op JOINT MOBILITY TESTING:  N/A   PALPATION:  Guarding through L shoulder              TODAY'S TREATMENT:  [facility] Adult PT Treatment:                                                DATE: 02/14/22 Therapeutic Exercise: Supine press 15x 2# Supine flexion 15x2# Sidelie ER 15x2# Sidelie ABD 15x2# Prone flexion 15x2# Prone extension 15x2 Prone hor abd IR/ER 10/10x2# Prone scaption 15x2# Seated scaption 2# 2# UBE L1.5 3/3 min Scapular wall slides RTB 15x w/ball Ball pushups from wall 15x Circles in 90d flexion at wall using ball 30s x2 Manual Therapy: Posterior and inferior glenohumeral glides 3x10 each D1 F/E 15x w/PT assist, light resistance  [facility] Adult PT Treatment:                                                DATE: 02/07/22 Therapeutic Exercise: Supine press 15x (red ball) Supine flexion 15x(red ball) Sidelie ER 15x(red ball) Sidelie ABD 15x(red ball) Prone flexion 15x(red ball) Prone extension 15x(red ball) Prone hor abd IR/ER 10/10x(red ball) Prone scaption 15x(red ball) Seated scaption (red ball) UBE L1.5 3/3 min Scapular wall slides YTB 15x Scaption with mirror Manual Therapy: Posterior and inferior glenohumeral glides 3x10 each D1 F/E 15x w/PT assist  [facility] Adult PT Treatment:  DATE: 02/02/22 Therapeutic Exercise: Supine press 15x 2# Supine flexion 15x2# Sidelie ER 15x2# Sidelie ABD 15x2# Prone flexion 15x2# Prone extension 15x2 Prone hor abd IR/ER 10/10x2# Prone scaption 15x2# Seated scaption 2# 2# UBE L1.5 3/3 min Scapular wall slides YTB 15x Manual Therapy: Posterior and inferior glenohumeral glides 3x10 each D1 F/E 15x w/PT assist  [facility] Adult PT Treatment:                                                DATE: 01/26/22 Therapeutic Exercise: Supine press 15x (green ball) Supine flexion 15x(green ball) Sidelie ER 15x(green ball) Sidelie ABD 15x(green ball) Prone flexion 15x(green ball) Prone extension 15x(green ball) Prone hor abd IR/ER 10/10x(green ball) Prone scaption 15x(green ball) Seated scaption UBE L1 3/3 min Manual Therapy: Posterior and inferior glenohumeral glides 3x10 each D1 F/E 15x w/PT assist  [facility] Adult PT Treatment:                                                DATE: 01/17/22 Therapeutic Exercise: Supine press 15x Supine flexion 15x Sidelie ER 15x Sidelie ER 15x Prone flexion 15x Prone extension 15x Prone hor abd IR/ER 10/10x Prone scaption 15x  Manual Therapy  Posterior and inferior glenohumeral glides 3x10 each  D1 F/E 15x w/PT assist      PATIENT EDUCATION: Education details: Discussed eval findings, rehab rationale and POC and patient is in agreement Person educated: Patient Education method: Explanation and Demonstration Education comprehension: verbalized understanding and needs further education     HOME EXERCISE PROGRAM: Access Code: [email or web address] URL: [email or web address] Date: 02/02/2022 Prepared by: Sharlynn Oliphant  Exercises - Isometric Shoulder Flexion at Wall  - 2 x daily - 5 x weekly - 1 sets - 10 reps - 3 hold - Isometric Shoulder Abduction at Wall  - 2 x daily - 5 x weekly - 1 sets - 10 reps - 3 hold - Isometric Shoulder External Rotation at Wall  - 2 x daily - 5 x weekly - 1 sets - 10  reps - 3 hold - Standing Isometric Shoulder Internal Rotation at Doorway  - 2 x daily - 5 x weekly - 1 sets - 10 reps - 3 hold - Scapular Wall Slides  - 2 x daily - 7 x weekly - 1 sets - 15 reps   ASSESSMENT:   CLINICAL IMPRESSION:  Added resistance to tasks as noted today and incorporated additional CKC tasks.  Able to maintain L scapula depressed during abduction, scaption and flexion.   OBJECTIVE IMPAIRMENTS decreased knowledge of use of DME, decreased ROM, decreased strength, impaired UE functional use, postural dysfunction, and pain.    ACTIVITY LIMITATIONS cleaning, driving, meal prep, and occupation.    PERSONAL FACTORS Age and Time since onset of injury/illness/exacerbation are also affecting patient's functional outcome.      REHAB POTENTIAL: Good   CLINICAL DECISION MAKING: Stable/uncomplicated   EVALUATION COMPLEXITY: Moderate     GOALS: Goals reviewed with patient? Yes   SHORT TERM GOALS: Target date: 12/27/2021   Patient to demonstrate independence in HEP  Baseline:[id number] Goal status: Met   2.  Maintain PROM to 90d flexion, 45d abd, 20d ER at side, IR to abdomen Baseline: 90d flexion, 45d abd,  20d ER at side, IR to abdomen Goal status: Met       LONG TERM GOALS: Target date: 03/19/2022   4/10 worst pain Baseline: 2/10 worst pain Goal status: Met   2.  AROM to 160d flexion, 160d abd, 70d ER at side, IR behind back Baseline: 135d flexion, 90d abd, 30d ER, IR 70d Goal status: Ongoing   3.  Increase L shoulder strength to 4/5 all planes Baseline: L shoulder strength 3/5 all planes Goal status: Ongoing   4.  Increase DASH score to 60/80 Baseline: 34 Goal status: Ongoing  5. Restore normal posture and scapular mechanics  Baseline: Scapulohumeral restrictions noted beginning at 90d abduction  Goal status: Ongoing       PLAN: PT FREQUENCY: 2x/week   PT DURATION: 6 weeks   PLANNED INTERVENTIONS: Therapeutic exercises, Therapeutic activity,  Neuromuscular re-education, Balance training, Gait training, Patient/Family education, Joint mobilization, DME instructions, and Manual therapy   PLAN FOR NEXT SESSION: review HEP, add isometrics per protocol, ROM, rhythmic stabilization, A/PROM, scapulothoracic mobility and stabilization, HEP update promoting scapular depression    Lanice Shirts, PT 02/14/2022, 3:23 PM   Check all possible CPT codes: 66294 - Re-evaluation, 97110- Therapeutic Exercise, [phone]- Neuro Re-education, [phone] - Gait Training, [phone] - Manual Therapy, 97530 - Therapeutic Activities, and 97535 - Self Care     If treatment provided at initial evaluation, no treatment charged due to lack of authorization.

## 2022-02-21 ENCOUNTER — Ambulatory Visit: Payer: Medicaid Other

## 2022-02-21 DIAGNOSIS — M6281 Muscle weakness (generalized): Secondary | ICD-10-CM | POA: Diagnosis not present

## 2022-02-21 DIAGNOSIS — M25512 Pain in left shoulder: Secondary | ICD-10-CM

## 2022-02-21 NOTE — Therapy (Signed)
OUTPATIENT PHYSICAL THERAPY TREATMENT NOTE   Patient Name: Olivia Kelly MRN: [id number] DOB:December 09, 1985, 36 y.o., female Today's Date: 02/21/2022  PCP: Patient, No Pcp Per REFERRING PROVIDER: Hiram Gash, MD  END OF SESSION:   PT End of Session - 02/21/22 1458     Visit Number 11    Number of Visits 14    Date for PT Re-Evaluation 02/07/22    Authorization Type  MCD    PT Start Time 1500    PT Stop Time 1530    PT Time Calculation (min) 30 min    Activity Tolerance Patient tolerated treatment well    Behavior During Therapy [facility] for tasks assessed/performed                Past Medical History:  Diagnosis Date   Anemia due to blood loss, acute 04/03/2016   History of COVID-19 12/12/2019   Seizures ([place])    last seizure 2015, on meds   Past Surgical History:  Procedure Laterality Date   cyst removed middle of chest at 45-42 years old     [place], URETEROSCOPY AND STENT PLACEMENT Left 10/29/2014   Procedure: CYSTOSCOPY WITH LEFT RETROGRADE PYELOGRAM,  DIAGNOSTIC LEFT  URETEROSCOPY AND STENT PLACEMENT;  Surgeon: Alexis Frock, MD;  Location: [facility];  Service: Urology;  Laterality: Left;   CYSTOSCOPY WITH RETROGRADE PYELOGRAM, URETEROSCOPY AND STENT PLACEMENT Left 01/23/2015   Procedure: CYSTOSCOPY WITH LEFT RETROGRADE PYELOGRAM/URETERAL STENT PLACEMENT;  Surgeon: Alexis Frock, MD;  Location: [facility];  Service: Urology;  Laterality: Left;   IUD REMOVAL N/A 06/09/2015   Procedure: INTRAUTERINE DEVICE (IUD) REMOVAL;  Surgeon: Emily Filbert, MD;  Location: [facility];  Service: Gynecology;  Laterality: N/A;   NEPHRECTOMY     half of left kidney removed   ROBOT ASSISTED PYELOPLASTY Left 01/23/2015   Procedure: ROBOTIC ASSISTED PYELOPLASTY WITH STENT PLACEMENT;  Surgeon: Alexis Frock, MD;  Location: [facility];  Service: Urology;  Laterality: Left;   SHOULDER ARTHROSCOPY WITH BANKART REPAIR Left 12/02/2021   Procedure: Arthroscopy  Shoulder with Extensive Debridement Capsulorrhaphy and Superior Labrum Anterior-Posterior/Bankart Repair;  Surgeon: Hiram Gash, MD;  Location: [facility];  Service: Orthopedics;  Laterality: Left;   Patient Active Problem List   Diagnosis Date Noted   Generalized anxiety disorder 09/01/2020   History of COVID-19 12/12/2019   UPJ obstruction, 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  Will be going into work for the first time since surgery this afternoon.  PAIN:  Are you having pain? Yes: NPRS scale: 3/10 Pain location: L shoulder Pain description: ache Aggravating factors: motion and positions Relieving factors: meds   OBJECTIVE: (objective measures completed at initial evaluation unless otherwise dated)   OBJECTIVE:    PATIENT SURVEYS:  Quick Dash 34   COGNITION:           Overall cognitive status: Within functional limits for tasks assessed  SENSATION: WFL   POSTURE: Rounded shoulders   UPPER EXTREMITY ROM:    Passive ROM Right 12/13/2021 Left 12/13/2021 Left PROM 01/17/22  Shoulder flexion   90d 135d  Shoulder extension       Shoulder abduction   45d 90d  Shoulder adduction       Shoulder internal rotation   70d 75d  Shoulder external rotation   20d 30d  Elbow flexion       Elbow extension       Wrist flexion       Wrist extension       Wrist ulnar deviation       Wrist radial deviation       Wrist pronation       Wrist supination        (Blank rows = not tested)   UPPER EXTREMITY MMT:   MMT Right 12/13/2021 Left 12/13/2021 Left 01/17/22  Shoulder flexion   * 3  Shoulder extension   * 3  Shoulder abduction   * 3  Shoulder adduction   * 3  Shoulder internal rotation   * 3  Shoulder external rotation   * 3  Middle trapezius     3  Lower trapezius     3  Elbow flexion   3   Elbow extension   3   Wrist flexion   4   Wrist extension   4   Wrist ulnar deviation   4   Wrist radial deviation   4   Wrist pronation       Wrist supination       Grip strength (lbs)       (* not tested due to post-op status)   SHOULDER SPECIAL TESTS:            N/A post-op  JOINT MOBILITY TESTING:  N/A   PALPATION:  Guarding through L shoulder              TODAY'S TREATMENT:  [facility] Adult PT Treatment:                                                DATE: 02/21/22 Therapeutic Exercise: Supine press 10x 3# Supine flexion 10x 3# Sidelie ER 10x 3# Sidelie ABD 10x 3# Prone flexion 10x 3# Prone extension 10x 3# Prone hor abd IR/ER 7/7x 3# Prone scaption 10x 3# Seated scaption 10 3# UBE L 2.0 3/3 min First rib mobilization into supine flexion 10x  [facility] Adult PT Treatment:                                                DATE: 02/14/22 Therapeutic Exercise: Supine press 15x 2# Supine flexion 15x2# Sidelie ER 15x2# Sidelie ABD 15x2# Prone flexion 15x2# Prone extension 15x2 Prone hor abd IR/ER 10/10x2# Prone scaption 15x2# Seated scaption 2# 2# UBE L1.5 3/3 min Scapular wall slides RTB 15x w/ball Ball pushups from wall 15x Circles in 90d flexion at wall using ball 30s x2 Manual Therapy: Posterior and inferior glenohumeral glides 3x10 each D1 F/E 15x w/PT assist, light resistance  [facility] Adult PT Treatment:  DATE: 02/07/22 Therapeutic Exercise: Supine press 15x (red ball) Supine flexion 15x(red ball) Sidelie ER 15x(red ball) Sidelie ABD 15x(red ball) Prone flexion 15x(red  ball) Prone extension 15x(red ball) Prone hor abd IR/ER 10/10x(red ball) Prone scaption 15x(red ball) Seated scaption (red ball) UBE L1.5 3/3 min Scapular wall slides YTB 15x Scaption with mirror Manual Therapy: Posterior and inferior glenohumeral glides 3x10 each D1 F/E 15x w/PT assist       PATIENT EDUCATION: Education details: Discussed eval findings, rehab rationale and POC and patient is in agreement Person educated: Patient Education method: Explanation and Demonstration Education comprehension: verbalized understanding and needs further education     HOME EXERCISE PROGRAM: Access Code: [email or web address] URL: [email or web address] Date: 02/02/2022 Prepared by: Sharlynn Oliphant  Exercises - Isometric Shoulder Flexion at Wall  - 2 x daily - 5 x weekly - 1 sets - 10 reps - 3 hold - Isometric Shoulder Abduction at Wall  - 2 x daily - 5 x weekly - 1 sets - 10 reps - 3 hold - Isometric Shoulder External Rotation at Wall  - 2 x daily - 5 x weekly - 1 sets - 10 reps - 3 hold - Standing Isometric Shoulder Internal Rotation at Doorway  - 2 x daily - 5 x weekly - 1 sets - 10 reps - 3 hold - Scapular Wall Slides  - 2 x daily - 7 x weekly - 1 sets - 15 reps   ASSESSMENT:   CLINICAL IMPRESSION:  Showing some impaired mechanics with clavicle mobility as well as L first rib restrictions, mobility improved following STM.  Able to advance to increased weight with PREs.  ROM restrictions in flexion most prominent  OBJECTIVE IMPAIRMENTS decreased knowledge of use of DME, decreased ROM, decreased strength, impaired UE functional use, postural dysfunction, and pain.    ACTIVITY LIMITATIONS cleaning, driving, meal prep, and occupation.    PERSONAL FACTORS Age and Time since onset of injury/illness/exacerbation are also affecting patient's functional outcome.      REHAB POTENTIAL: Good   CLINICAL DECISION MAKING: Stable/uncomplicated   EVALUATION COMPLEXITY: Moderate      GOALS: Goals reviewed with patient? Yes   SHORT TERM GOALS: Target date: 12/27/2021   Patient to demonstrate independence in HEP  Baseline:[id number] Goal status: Met   2.  Maintain PROM to 90d flexion, 45d abd, 20d ER at side, IR to abdomen Baseline: 90d flexion, 45d abd, 20d ER at side, IR to abdomen Goal status: Met       LONG TERM GOALS: Target date: 03/19/2022   4/10 worst pain Baseline: 2/10 worst pain Goal status: Met   2.  AROM to 160d flexion, 160d abd, 70d ER at side, IR behind back Baseline: 135d flexion, 90d abd, 30d ER, IR 70d Goal status: Ongoing   3.  Increase L shoulder strength to 4/5 all planes Baseline: L shoulder strength 3/5 all planes Goal status: Ongoing   4.  Increase DASH score to 60/80 Baseline: 34 Goal status: Ongoing  5. Restore normal posture and scapular mechanics  Baseline: Scapulohumeral restrictions noted beginning at 90d abduction  Goal status: Ongoing       PLAN: PT FREQUENCY: 2x/week   PT DURATION: 6 weeks   PLANNED INTERVENTIONS: Therapeutic exercises, Therapeutic activity, Neuromuscular re-education, Balance training, Gait training, Patient/Family education, Joint mobilization, DME instructions, and Manual therapy   PLAN FOR NEXT SESSION: review HEP, add isometrics per protocol, ROM, rhythmic stabilization, A/PROM, scapulothoracic mobility and stabilization, HEP update promoting scapular depression    Dellis Filbert  Karis Juba, PT 02/21/2022, 4:45 PM   Check all possible CPT codes: 33007 - Re-evaluation, 97110- Therapeutic Exercise, [phone]- Neuro Re-education, [phone] - Gait Training, [phone] - Manual Therapy, 97530 - Therapeutic Activities, and 97535 - Self Care     If treatment provided at initial evaluation, no treatment charged due to lack of authorization.

## 2022-02-28 ENCOUNTER — Ambulatory Visit: Payer: Medicaid Other

## 2022-03-02 ENCOUNTER — Ambulatory Visit: Payer: Medicaid Other

## 2022-03-02 DIAGNOSIS — M6281 Muscle weakness (generalized): Secondary | ICD-10-CM | POA: Diagnosis not present

## 2022-03-02 DIAGNOSIS — M25512 Pain in left shoulder: Secondary | ICD-10-CM

## 2022-03-02 DIAGNOSIS — S43432A Superior glenoid labrum lesion of left shoulder, initial encounter: Secondary | ICD-10-CM

## 2022-03-02 NOTE — Therapy (Signed)
OUTPATIENT PHYSICAL THERAPY TREATMENT NOTE   Patient Name: Olivia Kelly MRN: [id number] DOB:10/13/1985, 36 y.o., female Today's Date: 03/02/2022  PCP: Patient, No Pcp Per REFERRING PROVIDER: Hiram Gash, MD  END OF SESSION:   PT End of Session - 03/02/22 1454     Visit Number 12    Number of Visits 14    Date for PT Re-Evaluation 03/19/22    Authorization Type [place] MCD    PT Start Time 1445    PT Stop Time 1525    PT Time Calculation (min) 40 min    Activity Tolerance Patient tolerated treatment well    Behavior During Therapy [facility] for tasks assessed/performed                Past Medical History:  Diagnosis Date   Anemia due to blood loss, acute 04/03/2016   History of COVID-19 12/12/2019   Seizures ([place])    last seizure 2015, on meds   Past Surgical History:  Procedure Laterality Date   cyst removed middle of chest at 68-63 years old     [place], URETEROSCOPY AND STENT PLACEMENT Left 10/29/2014   Procedure: CYSTOSCOPY WITH LEFT RETROGRADE PYELOGRAM,  DIAGNOSTIC LEFT  URETEROSCOPY AND STENT PLACEMENT;  Surgeon: Alexis Frock, MD;  Location: [facility];  Service: Urology;  Laterality: Left;   CYSTOSCOPY WITH RETROGRADE PYELOGRAM, URETEROSCOPY AND STENT PLACEMENT Left 01/23/2015   Procedure: CYSTOSCOPY WITH LEFT RETROGRADE PYELOGRAM/URETERAL STENT PLACEMENT;  Surgeon: Alexis Frock, MD;  Location: [facility];  Service: Urology;  Laterality: Left;   IUD REMOVAL N/A 06/09/2015   Procedure: INTRAUTERINE DEVICE (IUD) REMOVAL;  Surgeon: Emily Filbert, MD;  Location: [facility];  Service: Gynecology;  Laterality: N/A;   NEPHRECTOMY     half of left kidney removed   ROBOT ASSISTED PYELOPLASTY Left 01/23/2015   Procedure: ROBOTIC ASSISTED PYELOPLASTY WITH STENT PLACEMENT;  Surgeon: Alexis Frock, MD;  Location: [facility];  Service: Urology;  Laterality: Left;   SHOULDER ARTHROSCOPY WITH BANKART REPAIR Left 12/02/2021   Procedure: Arthroscopy  Shoulder with Extensive Debridement Capsulorrhaphy and Superior Labrum Anterior-Posterior/Bankart Repair;  Surgeon: Hiram Gash, MD;  Location: [facility];  Service: Orthopedics;  Laterality: Left;   Patient Active Problem List   Diagnosis Date Noted   Generalized anxiety disorder 09/01/2020   History of COVID-19 12/12/2019   UPJ obstruction, 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 Yes: NPRS scale: 3/10 Pain location: L shoulder Pain description: ache Aggravating factors: motion and positions Relieving factors: meds   OBJECTIVE: (objective measures completed at initial evaluation unless otherwise dated)   OBJECTIVE:    PATIENT SURVEYS:  Quick Dash 34   COGNITION:           Overall cognitive status: Within functional limits for tasks assessed  SENSATION: WFL   POSTURE: Rounded shoulders   UPPER EXTREMITY ROM:    Passive ROM Right 12/13/2021 Left 12/13/2021 Left PROM 01/17/22 L AROM 03/02/22  Shoulder flexion   90d 135d 160d  Shoulder extension        Shoulder abduction   45d 90d 170d  Shoulder adduction        Shoulder internal rotation   70d 75d 80d  Shoulder external rotation   20d 30d 70d  Elbow flexion        Elbow extension        Wrist flexion        Wrist extension        Wrist ulnar deviation        Wrist radial deviation         Wrist pronation        Wrist supination        (Blank rows = not tested)   UPPER EXTREMITY MMT:   MMT Right 12/13/2021 Left 12/13/2021 Left 01/17/22 L 03/02/22  Shoulder flexion   * 3 4-  Shoulder extension   * 3 4-  Shoulder abduction   * 3 4-  Shoulder adduction   * 3 4-  Shoulder internal rotation   * 3 4-  Shoulder external rotation   * 3 4-  Middle trapezius     3 4-  Lower trapezius     3 4-  Elbow flexion   3    Elbow extension   3    Wrist flexion   4    Wrist extension   4    Wrist ulnar deviation   4    Wrist radial deviation   4    Wrist pronation        Wrist supination        Grip strength (lbs)        (* not tested due to post-op status)   SHOULDER SPECIAL TESTS:            N/A post-op  JOINT MOBILITY TESTING:  N/A   PALPATION:  Guarding through L shoulder              TODAY'S TREATMENT:  [facility] Adult PT Treatment:                                                DATE: 03/02/22 Therapeutic Exercise: Wall pushups with ball 15x Sleeper stretch on wall 30s x2 Scapular retraction on wall with YTB and pink foam roll 15x Supine rhythmis stab at 90d flexion 1KG ball 60s CW/CCW Quadruped alternating shoulder flexion and Hor ABD from pink roll 10/10 ea.  UBE L 2.5 3/3 min  Manual Therapy: First rib and clavicle mobs L into AROM flexion  [facility] Adult PT Treatment:                                                DATE: 02/21/22 Therapeutic Exercise: Supine press 10x 3# Supine flexion 10x 3# Sidelie ER 10x 3# Sidelie ABD 10x 3# Prone flexion 10x 3# Prone extension 10x 3# Prone hor abd IR/ER 7/7x 3# Prone scaption 10x 3# Seated scaption 10 3# UBE L 2.0 3/3 min First rib mobilization into supine flexion  10x  [facility] Adult PT Treatment:                                                DATE: 02/14/22 Therapeutic Exercise: Supine press 15x 2# Supine flexion 15x2# Sidelie ER 15x2# Sidelie ABD 15x2# Prone flexion 15x2# Prone extension 15x2 Prone hor abd IR/ER  10/10x2# Prone scaption 15x2# Seated scaption 2# 2# UBE L1.5 3/3 min Scapular wall slides RTB 15x w/ball Ball pushups from wall 15x Circles in 90d flexion at wall using ball 30s x2 Manual Therapy: Posterior and inferior glenohumeral glides 3x10 each D1 F/E 15x w/PT assist, light resistance      PATIENT EDUCATION: Education details: Discussed eval findings, rehab rationale and POC and patient is in agreement Person educated: Patient Education method: Explanation and Demonstration Education comprehension: verbalized understanding and needs further education     HOME EXERCISE PROGRAM: Access Code: [email or web address] URL: [email or web address] Date: 02/02/2022 Prepared by: Sharlynn Oliphant  Exercises - Isometric Shoulder Flexion at Wall  - 2 x daily - 5 x weekly - 1 sets - 10 reps - 3 hold - Isometric Shoulder Abduction at Wall  - 2 x daily - 5 x weekly - 1 sets - 10 reps - 3 hold - Isometric Shoulder External Rotation at Wall  - 2 x daily - 5 x weekly - 1 sets - 10 reps - 3 hold - Standing Isometric Shoulder Internal Rotation at Doorway  - 2 x daily - 5 x weekly - 1 sets - 10 reps - 3 hold - Scapular Wall Slides  - 2 x daily - 7 x weekly - 1 sets - 15 reps   ASSESSMENT:   CLINICAL IMPRESSION:  Today's session focused on CKC tasks for stability, regaining AROM, restoring first rib and clavicle mechanics.  No pain reported with tasks, weakness noted when forced to stabilize in WB position, fatigue after session.  Ability to depress scapula with OH motions much improved but not yet fluid as patient required to focus on task.  OBJECTIVE IMPAIRMENTS decreased knowledge of use of DME, decreased ROM, decreased strength, impaired UE functional use, postural dysfunction, and pain.    ACTIVITY LIMITATIONS cleaning, driving, meal prep, and occupation.    PERSONAL FACTORS Age and Time since onset of injury/illness/exacerbation are also affecting patient's functional outcome.       REHAB POTENTIAL: Good   CLINICAL DECISION MAKING: Stable/uncomplicated   EVALUATION COMPLEXITY: Moderate     GOALS: Goals reviewed with patient? Yes   SHORT TERM GOALS: Target date: 12/27/2021   Patient to demonstrate independence in HEP  Baseline:[id number] Goal status: Met   2.  Maintain PROM to 90d flexion, 45d abd, 20d ER at side, IR to abdomen Baseline: 90d flexion, 45d abd, 20d ER at side, IR to abdomen Goal status: Met       LONG TERM GOALS: Target date: 03/19/2022   4/10 worst pain Baseline: 2/10 worst pain Goal status: Met   2.  AROM to 160d flexion, 160d abd, 70d ER at side, IR behind back Baseline: 135d flexion, 90d abd, 30d ER, IR 70d Goal status: Ongoing   3.  Increase L shoulder strength to 4/5 all planes Baseline: L shoulder strength 3/5 all planes Goal status: Ongoing   4.  Increase DASH score to 60/80 Baseline: 34 Goal status: Ongoing  5. Restore normal posture and scapular mechanics  Baseline: Scapulohumeral restrictions noted beginning at 90d abduction  Goal status: Ongoing       PLAN: PT FREQUENCY: 2x/week   PT DURATION: 6 weeks   PLANNED INTERVENTIONS: Therapeutic exercises, Therapeutic activity, Neuromuscular re-education, Balance training, Gait training, Patient/Family education, Joint mobilization, DME instructions, and Manual therapy   PLAN FOR NEXT SESSION: review HEP, add isometrics per protocol, ROM, rhythmic stabilization, A/PROM, scapulothoracic mobility and stabilization, HEP update promoting scapular depression, CKC exercises for stability and endurance     Lanice Shirts, PT 03/02/2022, 2:56 PM   Check all possible CPT codes: 39122 - Re-evaluation, 97110- Therapeutic Exercise, [phone]- Neuro Re-education, [phone] - Gait Training, [phone] - Manual Therapy, 97530 - Therapeutic Activities, and 97535 - Self Care     If treatment provided at initial evaluation, no treatment charged due to lack of authorization.

## 2022-03-07 ENCOUNTER — Ambulatory Visit: Payer: Medicaid Other | Attending: Orthopaedic Surgery

## 2022-03-07 DIAGNOSIS — M6281 Muscle weakness (generalized): Secondary | ICD-10-CM | POA: Insufficient documentation

## 2022-03-07 DIAGNOSIS — M25512 Pain in left shoulder: Secondary | ICD-10-CM | POA: Diagnosis present

## 2022-03-07 DIAGNOSIS — S43432A Superior glenoid labrum lesion of left shoulder, initial encounter: Secondary | ICD-10-CM | POA: Diagnosis present

## 2022-03-07 NOTE — Therapy (Signed)
OUTPATIENT PHYSICAL THERAPY TREATMENT NOTE   Patient Name: Olivia Kelly MRN: [id number] DOB:01-May-1986, 36 y.o., female Today's Date: 03/07/2022  PCP: Patient, No Pcp Per REFERRING PROVIDER: Hiram Gash, MD  END OF SESSION:   PT End of Session - 03/07/22 1456     Visit Number 13    Number of Visits 14    Date for PT Re-Evaluation 03/19/22    Authorization Type [place] MCD    PT Start Time 1450    PT Stop Time 1530    PT Time Calculation (min) 40 min    Activity Tolerance Patient tolerated treatment well    Behavior During Therapy [facility] for tasks assessed/performed                Past Medical History:  Diagnosis Date   Anemia due to blood loss, acute 04/03/2016   History of COVID-19 12/12/2019   Seizures ([place])    last seizure 2015, on meds   Past Surgical History:  Procedure Laterality Date   cyst removed middle of chest at 40-40 years old     [place], URETEROSCOPY AND STENT PLACEMENT Left 10/29/2014   Procedure: CYSTOSCOPY WITH LEFT RETROGRADE PYELOGRAM,  DIAGNOSTIC LEFT  URETEROSCOPY AND STENT PLACEMENT;  Surgeon: Alexis Frock, MD;  Location: [facility];  Service: Urology;  Laterality: Left;   CYSTOSCOPY WITH RETROGRADE PYELOGRAM, URETEROSCOPY AND STENT PLACEMENT Left 01/23/2015   Procedure: CYSTOSCOPY WITH LEFT RETROGRADE PYELOGRAM/URETERAL STENT PLACEMENT;  Surgeon: Alexis Frock, MD;  Location: [facility];  Service: Urology;  Laterality: Left;   IUD REMOVAL N/A 06/09/2015   Procedure: INTRAUTERINE DEVICE (IUD) REMOVAL;  Surgeon: Emily Filbert, MD;  Location: [facility];  Service: Gynecology;  Laterality: N/A;   NEPHRECTOMY     half of left kidney removed   ROBOT ASSISTED PYELOPLASTY Left 01/23/2015   Procedure: ROBOTIC ASSISTED PYELOPLASTY WITH STENT PLACEMENT;  Surgeon: Alexis Frock, MD;  Location: [facility];  Service: Urology;  Laterality: Left;   SHOULDER ARTHROSCOPY WITH BANKART REPAIR Left 12/02/2021   Procedure: Arthroscopy  Shoulder with Extensive Debridement Capsulorrhaphy and Superior Labrum Anterior-Posterior/Bankart Repair;  Surgeon: Hiram Gash, MD;  Location: [place];  Service: Orthopedics;  Laterality: Left;   Patient Active Problem List   Diagnosis Date Noted   Generalized anxiety disorder 09/01/2020   History of COVID-19 12/12/2019   UPJ obstruction, 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  Denies pain main limitation is reaching [place] PAIN:  Are you having pain? Yes: NPRS scale: 3/10 Pain location: L shoulder Pain description: ache Aggravating factors: motion and positions Relieving factors: meds   OBJECTIVE: (objective measures completed at initial evaluation unless otherwise dated)   OBJECTIVE:    PATIENT SURVEYS:  Quick Dash 34   COGNITION:           Overall cognitive status: Within functional limits for tasks assessed  SENSATION: WFL   POSTURE: Rounded shoulders   UPPER EXTREMITY ROM:    Passive ROM Right 12/13/2021 Left 12/13/2021 Left PROM 01/17/22 L AROM 03/02/22  Shoulder flexion   90d 135d 160d  Shoulder extension        Shoulder abduction   45d 90d 170d  Shoulder adduction        Shoulder internal rotation   70d 75d 80d  Shoulder external rotation   20d 30d 70d  Elbow flexion        Elbow extension        Wrist flexion        Wrist extension        Wrist ulnar deviation        Wrist radial deviation         Wrist pronation        Wrist supination        (Blank rows = not tested)   UPPER EXTREMITY MMT:   MMT Right 12/13/2021 Left 12/13/2021 Left 01/17/22 L 03/02/22  Shoulder flexion   * 3 4-  Shoulder extension   * 3 4-  Shoulder abduction   * 3 4-  Shoulder adduction   * 3 4-  Shoulder internal rotation   * 3 4-  Shoulder external rotation   * 3 4-  Middle trapezius     3 4-  Lower trapezius     3 4-  Elbow flexion   3    Elbow extension   3    Wrist flexion   4    Wrist extension   4    Wrist ulnar deviation   4    Wrist radial deviation   4    Wrist pronation        Wrist supination        Grip strength (lbs)        (* not tested due to post-op status)   SHOULDER SPECIAL TESTS:            N/A post-op  JOINT MOBILITY TESTING:  N/A   PALPATION:  Guarding through L shoulder              TODAY'S TREATMENT:  [facility] Adult PT Treatment:                                                DATE: 03/07/22 Therapeutic Exercise: ER against wall B YTB 15x R PNF D1 F/E against wall YTB 15x Wall angels 15x Standing Wall Ball Circles with Mini Swiss Ball 15x CW/CCW Sleeper stretch on wall 30s x2 Quadruped A/P WSs across pink roll 10x Quadruped alternating shoulder flexion and Hor ABD from pink roll 10/10 ea.  Quadruped UE sidestepping across pink roll 10x each side UBE L 2.5 3/3 min 90/90 5# KB carry with L shoulder 242f Manual Therapy: First rib, clavicle and posterior shoulder mobs   [facility] Adult PT Treatment:                                                DATE: 03/02/22 Therapeutic Exercise: Wall pushups with ball 15x Sleeper stretch on wall 30s x2 Scapular retraction on wall with YTB and pink foam roll 15x Supine rhythmis stab at  90d flexion 1KG ball 60s CW/CCW Quadruped alternating shoulder flexion and Hor ABD from pink roll 10/10 ea.  UBE L 2.5 3/3 min  Manual Therapy: First rib and clavicle mobs L into AROM flexion  [facility] Adult PT Treatment:                                                 DATE: 02/21/22 Therapeutic Exercise: Supine press 10x 3# Supine flexion 10x 3# Sidelie ER 10x 3# Sidelie ABD 10x 3# Prone flexion 10x 3# Prone extension 10x 3# Prone hor abd IR/ER 7/7x 3# Prone scaption 10x 3# Seated scaption 10 3# UBE L 2.0 3/3 min First rib mobilization into supine flexion 10x       PATIENT EDUCATION: Education details: Discussed eval findings, rehab rationale and POC and patient is in agreement Person educated: Patient Education method: Customer service manager Education comprehension: verbalized understanding and needs further education     HOME EXERCISE PROGRAM: Access Code: [email or web address] URL: [email or web address] Date: 03/07/2022 Prepared by: Sharlynn Oliphant  Exercises - Shoulder External Rotation and Scapular Retraction with Resistance  - 2 x daily - 7 x weekly - 2 sets - 15 reps - Wall Angels  - 2 x daily - 7 x weekly - 2 sets - 15 reps - Standing Wall [employer] with Mini Swiss Ball  - 2 x daily - 7 x weekly - 2 sets - 15 reps - Standing Shoulder Single Arm PNF D2 Flexion with Resistance  - 2 x daily - 7 x weekly - 2 sets - 15 reps   ASSESSMENT:   CLINICAL IMPRESSION:  Has been DCed by surgeon as she is doing well, no c/o pain noted.  Today's session focused on HEP update and review, CKC and proprioception.  Continued mobs to L first rib, clavicle as well as posterior glenohumeral glides.  Supine shoulder flexion 165d.  OBJECTIVE IMPAIRMENTS decreased knowledge of use of DME, decreased ROM, decreased strength, impaired UE functional use, postural dysfunction, and pain.    ACTIVITY LIMITATIONS cleaning, driving, meal prep, and occupation.    PERSONAL FACTORS Age and Time since onset of injury/illness/exacerbation are also affecting patient's functional outcome.      REHAB POTENTIAL: Good   CLINICAL DECISION MAKING: Stable/uncomplicated   EVALUATION COMPLEXITY: Moderate     GOALS: Goals reviewed with  patient? Yes   SHORT TERM GOALS: Target date: 12/27/2021   Patient to demonstrate independence in HEP  Baseline:[id number] Goal status: Met   2.  Maintain PROM to 90d flexion, 45d abd, 20d ER at side, IR to abdomen Baseline: 90d flexion, 45d abd, 20d ER at side, IR to abdomen Goal status: Met       LONG TERM GOALS: Target date: 03/19/2022   4/10 worst pain Baseline: 2/10 worst pain Goal status: Met   2.  AROM to 160d flexion, 160d abd, 70d ER at side, IR behind back Baseline: 135d flexion, 90d abd, 30d ER, IR 70d Goal status: Ongoing   3.  Increase L shoulder strength to 4/5 all planes Baseline: L shoulder strength 3/5 all planes Goal status: Ongoing   4.  Increase DASH score to 60/80 Baseline: 34 Goal status: Ongoing  5. Restore normal posture and scapular mechanics  Baseline: Scapulohumeral restrictions noted beginning at 90d abduction  Goal status: Ongoing       PLAN: PT FREQUENCY: 2x/week  PT DURATION: 6 weeks   PLANNED INTERVENTIONS: Therapeutic exercises, Therapeutic activity, Neuromuscular re-education, Balance training, Gait training, Patient/Family education, Joint mobilization, DME instructions, and Manual therapy   PLAN FOR NEXT SESSION: A/PROM, scapulothoracic mobility and stabilization, HEP update promoting scapular depression, CKC exercises for stability and endurance, HEP review    Lanice Shirts, PT 03/07/2022, 2:58 PM   Check all possible CPT codes: [phone] - Re-evaluation, 97110- Therapeutic Exercise, [phone]- Neuro Re-education, [phone] - Gait Training, [phone] - Manual Therapy, 97530 - Therapeutic Activities, and 97535 - Self Care     If treatment provided at initial evaluation, no treatment charged due to lack of authorization.

## 2022-03-16 ENCOUNTER — Ambulatory Visit: Payer: Medicaid Other

## 2022-03-23 ENCOUNTER — Ambulatory Visit: Payer: Medicaid Other

## 2022-03-23 DIAGNOSIS — M25512 Pain in left shoulder: Secondary | ICD-10-CM

## 2022-03-23 DIAGNOSIS — S43432A Superior glenoid labrum lesion of left shoulder, initial encounter: Secondary | ICD-10-CM

## 2022-03-23 DIAGNOSIS — M6281 Muscle weakness (generalized): Secondary | ICD-10-CM | POA: Diagnosis not present

## 2022-03-23 NOTE — Therapy (Addendum)
OUTPATIENT PHYSICAL THERAPY TREATMENT NOTE/DC SUMMARY   Patient Name: Olivia Kelly MRN: [id number] DOB:1986/08/05, 36 y.o., female Today's Date: 03/23/2022  PCP: Patient, No Pcp Per REFERRING PROVIDER: Hiram Gash, MD PHYSICAL THERAPY DISCHARGE SUMMARY  Visits from Start of Care: 14  Current functional level related to goals / functional outcomes: Goals met   Remaining deficits: none   Education / Equipment: HEP   Patient agrees to discharge. Patient goals were met. Patient is being discharged due to being pleased with the current functional level.  END OF SESSION:   PT End of Session - 03/23/22 1619     Visit Number 14    Number of Visits 14    Date for PT Re-Evaluation 03/19/22    Authorization Type [place] MCD    PT Start Time 1620    PT Stop Time 1700    PT Time Calculation (min) 40 min    Activity Tolerance Patient tolerated treatment well    Behavior During Therapy WFL for tasks assessed/performed                Past Medical History:  Diagnosis Date   Anemia due to blood loss, acute 04/03/2016   History of COVID-19 12/12/2019   Seizures ([place])    last seizure 2015, on meds   Past Surgical History:  Procedure Laterality Date   cyst removed middle of chest at 95-49 years old     [place], URETEROSCOPY AND STENT PLACEMENT Left 10/29/2014   Procedure: CYSTOSCOPY WITH LEFT RETROGRADE PYELOGRAM,  DIAGNOSTIC LEFT  URETEROSCOPY AND STENT PLACEMENT;  Surgeon: Alexis Frock, MD;  Location: [facility];  Service: Urology;  Laterality: Left;   CYSTOSCOPY WITH RETROGRADE PYELOGRAM, URETEROSCOPY AND STENT PLACEMENT Left 01/23/2015   Procedure: CYSTOSCOPY WITH LEFT RETROGRADE PYELOGRAM/URETERAL STENT PLACEMENT;  Surgeon: Alexis Frock, MD;  Location: [facility];  Service: Urology;  Laterality: Left;   IUD REMOVAL N/A 06/09/2015   Procedure: INTRAUTERINE DEVICE (IUD) REMOVAL;  Surgeon: Emily Filbert, MD;  Location: [facility];  Service:  Gynecology;  Laterality: N/A;   NEPHRECTOMY     half of left kidney removed   ROBOT ASSISTED PYELOPLASTY Left 01/23/2015   Procedure: ROBOTIC ASSISTED PYELOPLASTY WITH STENT PLACEMENT;  Surgeon: Alexis Frock, MD;  Location: [facility];  Service: Urology;  Laterality: Left;   SHOULDER ARTHROSCOPY WITH BANKART REPAIR Left 12/02/2021   Procedure: Arthroscopy Shoulder with Extensive Debridement Capsulorrhaphy and Superior Labrum Anterior-Posterior/Bankart Repair;  Surgeon: Hiram Gash, MD;  Location: [place];  Service: Orthopedics;  Laterality: Left;   Patient Active Problem List   Diagnosis Date Noted   Generalized anxiety disorder 09/01/2020   History of COVID-19 12/12/2019   UPJ obstruction, 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   Seizures ([place]) 04/04/2013    REFERRING DIAG: Left shoulder arthroscopy with labrael repair, capsulorrhaphy and SLAP repair on 12/02/21   THERAPY DIAG:  Muscle weakness (generalized) - Plan: PT plan of care cert/re-cert  Acute pain of left shoulder - Plan: PT plan of care cert/re-cert  Tear of left glenoid labrum, initial encounter - Plan: PT plan of care cert/re-cert  PERTINENT HISTORY: History of seizures which lead to a L labral tear, patient is R handed  PRECAUTIONS: seizure, Shoulder follow post-op protocol  SUBJECTIVE: Denies pain, has returned to full duty work w/o limitation, able to complete all ADLs and feels confident to DC to independent management PAIN:  Are you having pain? Yes:  NPRS scale: 3/10 Pain location: L shoulder Pain description: ache Aggravating factors: motion and positions Relieving factors: meds   OBJECTIVE: (objective measures completed at initial evaluation unless otherwise dated)   OBJECTIVE:    PATIENT SURVEYS:  Quick Dash 34   COGNITION:           Overall cognitive status: Within functional limits for tasks assessed                                   SENSATION: WFL   POSTURE: Rounded shoulders   UPPER EXTREMITY ROM:    Passive ROM Right 12/13/2021 Left 12/13/2021 Left PROM 01/17/22 L AROM 03/02/22  Shoulder flexion   90d 135d 160d  Shoulder extension        Shoulder abduction   45d 90d 170d  Shoulder adduction        Shoulder internal rotation   70d 75d 80d  Shoulder external rotation   20d 30d 70d  Elbow flexion        Elbow extension        Wrist flexion        Wrist extension        Wrist ulnar deviation        Wrist radial deviation        Wrist pronation        Wrist supination        (Blank rows = not tested)   UPPER EXTREMITY MMT:   MMT Right 12/13/2021 Left 12/13/2021 Left 01/17/22 L 03/02/22  Shoulder flexion   * 3 4-  Shoulder extension   * 3 4-  Shoulder abduction   * 3 4-  Shoulder adduction   * 3 4-  Shoulder internal rotation   * 3 4-  Shoulder external rotation   * 3 4-  Middle trapezius     3 4-  Lower trapezius     3 4-  Elbow flexion   3    Elbow extension   3    Wrist flexion   4    Wrist extension   4    Wrist ulnar deviation   4    Wrist radial deviation   4    Wrist pronation        Wrist supination        Grip strength (lbs)        (* not tested due to post-op status)   SHOULDER SPECIAL TESTS:            N/A post-op  JOINT MOBILITY TESTING:  N/A   PALPATION:  Guarding through L shoulder              TODAY'S TREATMENT:  [facility] Adult PT Treatment:                                                DATE: 03/23/22 Therapeutic Exercise: R PNF D1 F/E against wall YTB 15x Wall angels 15x Scapular slides w/ RTB 15x UBE L 2.5 3/3 min   [facility] Adult PT Treatment:  DATE: 03/07/22 Therapeutic Exercise: ER against wall B YTB 15x R PNF D1 F/E against wall YTB 15x Wall angels 15x Standing Wall Ball Circles with Mini Swiss Ball 15x CW/CCW Sleeper stretch on wall 30s x2 Quadruped A/P WSs across pink roll 10x Quadruped alternating shoulder flexion  and Hor ABD from pink roll 10/10 ea.  Quadruped UE sidestepping across pink roll 10x each side UBE L 2.5 3/3 min 90/90 5# KB carry with L shoulder 259f Manual Therapy: First rib, clavicle and posterior shoulder mobs   [facility] Adult PT Treatment:                                                DATE: 03/02/22 Therapeutic Exercise: Wall pushups with ball 15x Sleeper stretch on wall 30s x2 Scapular retraction on wall with YTB and pink foam roll 15x Supine rhythmis stab at 90d flexion 1KG ball 60s CW/CCW Quadruped alternating shoulder flexion and Hor ABD from pink roll 10/10 ea.  UBE L 2.5 3/3 min  Manual Therapy: First rib and clavicle mobs L into AROM flexion  [facility] Adult PT Treatment:                                                DATE: 02/21/22 Therapeutic Exercise: Supine press 10x 3# Supine flexion 10x 3# Sidelie ER 10x 3# Sidelie ABD 10x 3# Prone flexion 10x 3# Prone extension 10x 3# Prone hor abd IR/ER 7/7x 3# Prone scaption 10x 3# Seated scaption 10 3# UBE L 2.0 3/3 min First rib mobilization into supine flexion 10x       PATIENT EDUCATION: Education details: Discussed eval findings, rehab rationale and POC and patient is in agreement Person educated: Patient Education method: ECustomer service managerEducation comprehension: verbalized understanding and needs further education     HOME EXERCISE PROGRAM: Access Code: [email or web address]: [email or web address] Date: 03/23/2022 Prepared by: JSharlynn Oliphant Exercises - Wall Angels  - 1 x daily - 3 x weekly - 1 sets - 15 reps - Standing Shoulder Single Arm PNF D2 Flexion with Resistance  - 1 x daily - 3 x weekly - 1 sets - 15 reps - Scapular Wall Slides  - 1 x daily - 3 x weekly - 1 sets - 15 reps   ASSESSMENT:   CLINICAL IMPRESSION:  Rehab goals met, patient ready for independent management  OBJECTIVE IMPAIRMENTS decreased knowledge of use of DME, decreased ROM, decreased strength, impaired UE  functional use, postural dysfunction, and pain.    ACTIVITY LIMITATIONS cleaning, driving, meal prep, and occupation.    PERSONAL FACTORS Age and Time since onset of injury/illness/exacerbation are also affecting patient's functional outcome.      REHAB POTENTIAL: Good   CLINICAL DECISION MAKING: Stable/uncomplicated   EVALUATION COMPLEXITY: Moderate     GOALS: Goals reviewed with patient? Yes   SHORT TERM GOALS: Target date: 12/27/2021   Patient to demonstrate independence in HEP  Baseline:[id number] Goal status: Met   2.  Maintain PROM to 90d flexion, 45d abd, 20d ER at side, IR to abdomen Baseline: 90d flexion, 45d abd, 20d ER at side, IR to abdomen Goal status: Met       LONG TERM GOALS: Target date: 03/19/2022   4/10 worst  pain Baseline: 2/10 worst pain Goal status: Met   2.  AROM to 160d flexion, 160d abd, 70d ER at side, IR behind back Baseline: 135d flexion, 90d abd, 30d ER, IR 70d; 03/23/22 160d flexion/abd, IR to lumbar spine, 70d ER  Goal status: Met   3.  Increase L shoulder strength to 4/5 all planes Baseline: L shoulder strength 3/5 all planes; 03/23/22 4+/5 in F/ABD/IE/ER/Scaption Goal status: Met   4.  Increase DASH score to 60/80 Baseline: 34; 03/23/22 89% functional Goal status: Met  5. Restore normal posture and scapular mechanics  Baseline: Scapulohumeral restrictions noted beginning at 90d abduction; 03/23/22 Able to maintain alignment past 90d  Goal status: Met       PLAN: PT FREQUENCY: 1x/week   PT DURATION: 1 weeks   PLANNED INTERVENTIONS: Therapeutic exercises, Therapeutic activity, Neuromuscular re-education, Balance training, Gait training, Patient/Family education, Joint mobilization, DME instructions, and Manual therapy   PLAN FOR NEXT SESSION: DC to HEP    Lanice Shirts, PT 03/23/2022, 5:01 PM   Check all possible CPT codes: 80638 - Re-evaluation, 97110- Therapeutic Exercise, [phone]- Neuro Re-education, [phone] - Gait Training,  [phone] - Manual Therapy, 97530 - Therapeutic Activities, and 31250 - Self Care     If treatment provided at initial evaluation, no treatment charged due to lack of authorization.

## 2022-06-12 ENCOUNTER — Other Ambulatory Visit: Payer: Self-pay | Admitting: Family Medicine

## 2022-06-14 MED ORDER — TRAZODONE HCL 50 MG PO TABS: 50.0000 mg | ORAL_TABLET | Freq: Every day | ORAL | 0 refills | Status: DC

## 2023-02-16 ENCOUNTER — Other Ambulatory Visit: Payer: Self-pay | Admitting: Family Medicine

## 2023-02-17 MED ORDER — TRAZODONE HCL 50 MG PO TABS
50.0000 mg | ORAL_TABLET | Freq: Every day | ORAL | 0 refills | Status: AC
Start: 2023-02-17 — End: ?

## 2023-04-21 ENCOUNTER — Emergency Department (HOSPITAL_COMMUNITY)
Admission: EM | Admit: 2023-04-21 | Discharge: 2023-04-21 | Disposition: A | Discharge status: Home / Self Care | Payer: Medicaid Other | Attending: Emergency Medicine | Admitting: Emergency Medicine

## 2023-04-21 ENCOUNTER — Other Ambulatory Visit: Payer: Self-pay

## 2023-04-21 DIAGNOSIS — R569 Unspecified convulsions: Secondary | ICD-10-CM | POA: Insufficient documentation

## 2023-04-21 LAB — CBC WITH DIFFERENTIAL/PLATELET
Abs Immature Granulocytes: 0.01 10*3/uL (ref 0.00–0.07)
Basophils Absolute: 0 10*3/uL (ref 0.0–0.1)
Basophils Relative: 0 %
Eosinophils Absolute: 0 10*3/uL (ref 0.0–0.5)
Eosinophils Relative: 0 %
HCT: 38.5 % (ref 36.0–46.0)
Hemoglobin: 12.4 g/dL (ref 12.0–15.0)
Immature Granulocytes: 0 %
Lymphocytes Relative: 14 %
Lymphs Abs: 0.7 10*3/uL (ref 0.7–4.0)
MCH: 28.2 pg (ref 26.0–34.0)
MCHC: 32.2 g/dL (ref 30.0–36.0)
MCV: 87.5 fL (ref 80.0–100.0)
Monocytes Absolute: 0.4 10*3/uL (ref 0.1–1.0)
Monocytes Relative: 8 %
Neutro Abs: 3.6 10*3/uL (ref 1.7–7.7)
Neutrophils Relative %: 78 %
Platelets: 320 10*3/uL (ref 150–400)
RBC: 4.4 MIL/uL (ref 3.87–5.11)
RDW: 12.4 % (ref 11.5–15.5)
WBC: 4.7 10*3/uL (ref 4.0–10.5)
nRBC: 0 % (ref 0.0–0.2)

## 2023-04-21 LAB — COMPREHENSIVE METABOLIC PANEL
ALT: 17 U/L (ref 0–44)
AST: 20 U/L (ref 15–41)
Albumin: 4.5 g/dL (ref 3.5–5.0)
Alkaline Phosphatase: 44 U/L (ref 38–126)
Anion gap: 9 (ref 5–15)
BUN: 11 mg/dL (ref 6–20)
CO2: 19 mmol/L — ABNORMAL LOW (ref 22–32)
Calcium: 8.8 mg/dL — ABNORMAL LOW (ref 8.9–10.3)
Chloride: 107 mmol/L (ref 98–111)
Creatinine, Ser: 0.82 mg/dL (ref 0.44–1.00)
GFR, Estimated: >60 mL/min (ref >60)
Glucose, Bld: 87 mg/dL (ref 70–99)
Potassium: 3.5 mmol/L (ref 3.5–5.1)
Sodium: 135 mmol/L (ref 135–145)
Total Bilirubin: 0.5 mg/dL (ref 0.3–1.2)
Total Protein: 8 g/dL (ref 6.5–8.1)

## 2023-04-21 LAB — HCG, SERUM, QUALITATIVE: Preg, Serum: NEGATIVE

## 2023-04-21 LAB — CBG MONITORING, ED: Glucose-Capillary: 113 mg/dL — ABNORMAL HIGH (ref 70–99)

## 2023-04-21 LAB — MAGNESIUM: Magnesium: 2.4 mg/dL (ref 1.7–2.4)

## 2023-04-21 LAB — ETHANOL: Alcohol, Ethyl (B): <10 mg/dL (ref <10)

## 2023-04-21 MED ORDER — IBUPROFEN 200 MG PO TABS
600.0000 mg | ORAL_TABLET | Freq: Once | ORAL | Status: AC
  Administered 2023-04-21: 600 mg via ORAL
  Filled 2023-04-21: qty 3

## 2023-04-21 NOTE — ED Provider Notes (Signed)
[place] EMERGENCY DEPARTMENT AT [facility] Provider Note   CSN: [id number] Arrival date & time: 04/21/23  3664     History  Chief Complaint  Patient presents with   Seizures    Olivia Kelly is a 37 y.o. female.  HPI Patient presents after witnessed seizure.  She arrives via EMS.  Per EMS she was hemodynamically unremarkable in transport.  Patient notes that she has had difficulty sleeping over the past few days, has had increased stress, but otherwise has been taking her medication as directed and denies other recent changes in her baseline condition. Does have a history of seizures, takes her medication regularly, has had breakthrough seizures in the past.  She follows up with neurology at [facility].     Home Medications Prior to Admission medications   Medication Sig Start Date End Date Taking? Authorizing Provider  Lacosamide 150 MG TABS Take by mouth. 08/09/21   [provider]  topiramate (TOPAMAX) 100 MG tablet Take 150 mg by mouth 2 (two) times daily.    [provider]  traZODone (DESYREL) 50 MG tablet Take 1 tablet (50 mg total) by mouth at bedtime. 02/17/23   Rodolph Bong, MD      Allergies    Patient has no known allergies.    Review of Systems   Review of Systems  All other systems reviewed and are negative.   Physical Exam Updated Vital Signs BP 113/62 (BP Location: Right Arm)   Pulse 88   Temp 98.2 F (36.8 C) (Oral)   Resp (!) 22   Ht 5\' 3"  (1.6 m)   Wt 83 kg   LMP 04/14/2023   SpO2 96%   BMI 32.41 kg/m  Physical Exam Vitals and nursing note reviewed.  Constitutional:      General: She is not in acute distress.    Appearance: She is well-developed.  HENT:     Head: Normocephalic and atraumatic.  Eyes:     Conjunctiva/sclera: Conjunctivae normal.  Pulmonary:     Effort: Pulmonary effort is normal. No respiratory distress.     Breath sounds: No stridor.  Abdominal:     General: There  is no distension.  Skin:    General: Skin is warm and dry.  Neurological:     General: No focal deficit present.     Mental Status: She is alert and oriented to person, place, and time.     Cranial Nerves: No cranial nerve deficit.  Psychiatric:        Mood and Affect: Mood normal.     ED Results / Procedures / Treatments   Labs (all labs ordered are listed, but only abnormal results are displayed) Labs Reviewed  COMPREHENSIVE METABOLIC PANEL - Abnormal; Notable for the following components:      Result Value   CO2 19 (*)    Calcium 8.8 (*)    All other components within normal limits  CBG MONITORING, ED - Abnormal; Notable for the following components:   Glucose-Capillary 113 (*)    All other components within normal limits  ETHANOL  CBC WITH DIFFERENTIAL/PLATELET  HCG, SERUM, QUALITATIVE  MAGNESIUM    EKG None  Radiology No results found.  Procedures Procedures    Medications Ordered in ED Medications  ibuprofen (ADVIL) tablet 600 mg (has no administration in time range)    ED Course/ Medical Decision Making/ A&P  Medical Decision Making Adult female with seizure history, reported medication compliance presents after witnessed seizure.  Absence of trauma, hemodynamic instability en route reassuring, low suspicion for intracranial injury, infection, but with concern for breakthrough seizure labs sent, patient monitored, Cardiac 90 sinus normal Pulse ox 100% room air normal   Amount and/or Complexity of Data Reviewed Independent Historian: EMS External Data Reviewed: notes. Labs: ordered. Decision-making details documented in ED Course.  Risk Prescription drug management. Decision regarding hospitalization.    11:06 AM   Awake, alert, in no distress.  She has mild headache, but no evidence for decompensated state, no additional seizure activity here.  As well, some suspicion for breakthrough seizure possibly secondary  to increased stress and lack of sleep patient is otherwise unremarkable no evidence for infection, bacteremia, sepsis, electrolyte abnormalities.  Patient appropriate for, agreeable to following up with her neurologist.        Final Clinical Impression(s) / ED Diagnoses Final diagnoses:  Seizure [facility])    Rx / DC Orders ED Discharge Orders     None         Gerhard Munch, MD 04/21/23 1106

## 2023-04-21 NOTE — Discharge Instructions (Addendum)
As discussed, your evaluation today has been largely reassuring.  But, it is important that you monitor your condition carefully, and do not hesitate to return to the ED if you develop new, or concerning changes in your condition. ? ?Otherwise, please follow-up with your physician for appropriate ongoing care. ? ?

## 2023-04-21 NOTE — ED Triage Notes (Signed)
Patient brought in from home by EMS after a witnessed seizure. Patient was laying on couch and slid to floor. No LOC no injuries no thinners. She c/o headache. Patient has HX of seizure takes Topamax daily . 112/74 110 99% RA CBG: 120

## 2023-08-07 ENCOUNTER — Emergency Department (HOSPITAL_COMMUNITY)
Admission: EM | Admit: 2023-08-07 | Discharge: 2023-08-07 | Disposition: A | Discharge status: Home / Self Care | Payer: Medicaid Other | Attending: Emergency Medicine | Admitting: Emergency Medicine

## 2023-08-07 ENCOUNTER — Encounter (HOSPITAL_COMMUNITY): Payer: Self-pay | Admitting: Emergency Medicine

## 2023-08-07 ENCOUNTER — Other Ambulatory Visit: Payer: Self-pay

## 2023-08-07 DIAGNOSIS — Z20822 Contact with and (suspected) exposure to covid-19: Secondary | ICD-10-CM | POA: Diagnosis not present

## 2023-08-07 DIAGNOSIS — R569 Unspecified convulsions: Secondary | ICD-10-CM | POA: Diagnosis present

## 2023-08-07 DIAGNOSIS — R531 Weakness: Secondary | ICD-10-CM | POA: Diagnosis not present

## 2023-08-07 LAB — CBC WITH DIFFERENTIAL/PLATELET
Abs Immature Granulocytes: 0.01 10*3/uL (ref 0.00–0.07)
Basophils Absolute: 0 10*3/uL (ref 0.0–0.1)
Basophils Relative: 1 %
Eosinophils Absolute: 0 10*3/uL (ref 0.0–0.5)
Eosinophils Relative: 1 %
HCT: 37.5 % (ref 36.0–46.0)
Hemoglobin: 12.5 g/dL (ref 12.0–15.0)
Immature Granulocytes: 0 %
Lymphocytes Relative: 38 %
Lymphs Abs: 1 10*3/uL (ref 0.7–4.0)
MCH: 28.7 pg (ref 26.0–34.0)
MCHC: 33.3 g/dL (ref 30.0–36.0)
MCV: 86 fL (ref 80.0–100.0)
Monocytes Absolute: 0.5 10*3/uL (ref 0.1–1.0)
Monocytes Relative: 18 %
Neutro Abs: 1.2 10*3/uL — ABNORMAL LOW (ref 1.7–7.7)
Neutrophils Relative %: 42 %
Platelets: 287 10*3/uL (ref 150–400)
RBC: 4.36 MIL/uL (ref 3.87–5.11)
RDW: 13 % (ref 11.5–15.5)
WBC: 2.7 10*3/uL — ABNORMAL LOW (ref 4.0–10.5)
nRBC: 0 % (ref 0.0–0.2)

## 2023-08-07 LAB — RESP PANEL BY RT-PCR (RSV, FLU A&B, COVID)  RVPGX2
Influenza A by PCR: NEGATIVE
Influenza B by PCR: NEGATIVE
Resp Syncytial Virus by PCR: NEGATIVE
SARS Coronavirus 2 by RT PCR: NEGATIVE

## 2023-08-07 LAB — COMPREHENSIVE METABOLIC PANEL
ALT: 17 U/L (ref 0–44)
AST: 16 U/L (ref 15–41)
Albumin: 4.1 g/dL (ref 3.5–5.0)
Alkaline Phosphatase: 42 U/L (ref 38–126)
Anion gap: 6 (ref 5–15)
BUN: 9 mg/dL (ref 6–20)
CO2: 22 mmol/L (ref 22–32)
Calcium: 9 mg/dL (ref 8.9–10.3)
Chloride: 108 mmol/L (ref 98–111)
Creatinine, Ser: 0.73 mg/dL (ref 0.44–1.00)
GFR, Estimated: >60 mL/min (ref >60)
Glucose, Bld: 101 mg/dL — ABNORMAL HIGH (ref 70–99)
Potassium: 3.3 mmol/L — ABNORMAL LOW (ref 3.5–5.1)
Sodium: 136 mmol/L (ref 135–145)
Total Bilirubin: 0.3 mg/dL (ref <1.2)
Total Protein: 7.2 g/dL (ref 6.5–8.1)

## 2023-08-07 LAB — URINALYSIS, ROUTINE W REFLEX MICROSCOPIC
Bilirubin Urine: NEGATIVE
Glucose, UA: NEGATIVE mg/dL
Hgb urine dipstick: NEGATIVE
Ketones, ur: NEGATIVE mg/dL
Nitrite: NEGATIVE
Protein, ur: NEGATIVE mg/dL
Specific Gravity, Urine: 1.008 (ref 1.005–1.030)
pH: 7 (ref 5.0–8.0)

## 2023-08-07 LAB — CBG MONITORING, ED: Glucose-Capillary: 72 mg/dL (ref 70–99)

## 2023-08-07 LAB — MAGNESIUM: Magnesium: 2 mg/dL (ref 1.7–2.4)

## 2023-08-07 LAB — HCG, SERUM, QUALITATIVE: Preg, Serum: NEGATIVE

## 2023-08-07 MED ORDER — SODIUM CHLORIDE 0.9 % IV BOLUS
1000.0000 mL | Freq: Once | INTRAVENOUS | Status: AC
  Administered 2023-08-07: 1000 mL via INTRAVENOUS

## 2023-08-07 MED ORDER — KETOROLAC TROMETHAMINE 30 MG/ML IJ SOLN
30.0000 mg | Freq: Once | INTRAMUSCULAR | Status: AC
Start: 2023-08-07 — End: 2023-08-07
  Administered 2023-08-07: 30 mg via INTRAVENOUS
  Filled 2023-08-07: qty 1

## 2023-08-07 NOTE — ED Triage Notes (Signed)
Pt BIB EMS from work, c/o pre seizure like symptoms. Per EMS, hx of epilepsy, pt stood up and felt warm, like she was going to have symptoms. Took prescribed Topamax this AM  BP 122/86 P 80 SpO2 99% CBG 112

## 2023-08-07 NOTE — ED Provider Notes (Signed)
Olivia Kelly   CSN: [id number] Arrival date & time: 08/07/23  4098     History  Chief Complaint  Patient presents with   Seizure-like    Olivia Kelly is a 37 y.o. female.  Pt is a 37 yo female with pmhx significant for seizures.  Pt takes topamax for her seizures and says she has been compliant.  Pt said she has not been sleeping well.  Today, she was at work and felt dizzy and hot.  She felt like she was going to have a seizure.  She did not have one, but thought she should come in and get checked.       Home Medications Prior to Admission medications   Medication Sig Start Date End Date Taking? Authorizing Provider  Lacosamide 150 MG TABS Take by mouth. 08/09/21   [provider]  topiramate (TOPAMAX) 100 MG tablet Take 150 mg by mouth 2 (two) times daily.    [provider]  traZODone (DESYREL) 50 MG tablet Take 1 tablet (50 mg total) by mouth at bedtime. 02/17/23   Rodolph Bong, MD      Allergies    Patient has no known allergies.    Review of Systems   Review of Systems  Neurological:  Positive for dizziness and weakness.  All other systems reviewed and are negative.   Physical Exam Updated Vital Signs BP (!) 119/93   Pulse (!) 55   Temp 98.6 F (37 C) (Oral)   Resp 16   Ht 5\' 2"  (1.575 m)   Wt 70.3 kg   SpO2 92%   BMI 28.35 kg/m  Physical Exam Vitals and nursing Kelly reviewed.  Constitutional:      Appearance: Normal appearance.  HENT:     Head: Normocephalic and atraumatic.     Right Ear: External ear normal.     Left Ear: External ear normal.     Nose: Nose normal.     Mouth/Throat:     Mouth: Mucous membranes are dry.  Eyes:     Extraocular Movements: Extraocular movements intact.     Conjunctiva/sclera: Conjunctivae normal.     Pupils: Pupils are equal, round, and reactive to light.  Cardiovascular:     Rate and Rhythm: Normal rate and regular rhythm.      Pulses: Normal pulses.     Heart sounds: Normal heart sounds.  Pulmonary:     Effort: Pulmonary effort is normal.     Breath sounds: Normal breath sounds.  Abdominal:     General: Abdomen is flat. Bowel sounds are normal.     Palpations: Abdomen is soft.  Musculoskeletal:        General: Normal range of motion.     Cervical back: Normal range of motion and neck supple.  Skin:    General: Skin is warm.     Capillary Refill: Capillary refill takes less than 2 seconds.  Neurological:     General: No focal deficit present.     Mental Status: She is alert and oriented to person, place, and time.  Psychiatric:        Mood and Affect: Mood normal.        Behavior: Behavior normal.        Thought Content: Thought content normal.        Judgment: Judgment normal.     ED Results / Procedures / Treatments   Labs (all labs ordered are listed, but  only abnormal results are displayed) Labs Reviewed  COMPREHENSIVE METABOLIC PANEL - Abnormal; Notable for the following components:      Result Value   Potassium 3.3 (*)    Glucose, Bld 101 (*)    All other components within normal limits  CBC WITH DIFFERENTIAL/PLATELET - Abnormal; Notable for the following components:   WBC 2.7 (*)    Neutro Abs 1.2 (*)    All other components within normal limits  URINALYSIS, ROUTINE W REFLEX MICROSCOPIC - Abnormal; Notable for the following components:   Leukocytes,Ua TRACE (*)    Bacteria, UA RARE (*)    All other components within normal limits  RESP PANEL BY RT-PCR (RSV, FLU A&B, COVID)  RVPGX2  HCG, SERUM, QUALITATIVE  MAGNESIUM  CBG MONITORING, ED    EKG EKG Interpretation Date/Time:  Monday August 07 2023 08:26:05 EST Ventricular Rate:  66 PR Interval:  140 QRS Duration:  88 QT Interval:  389 QTC Calculation: 408 R Axis:   28  Text Interpretation: Sinus rhythm Low voltage, precordial leads No significant change since last tracing Confirmed by Jacalyn Lefevre [phone]) on 08/07/2023  9:04:40 AM  Radiology No results found.  Procedures Procedures    Medications Ordered in ED Medications  ketorolac (TORADOL) 30 MG/ML injection 30 mg (has no administration in time range)  sodium chloride 0.9 % bolus 1,000 mL (0 mLs Intravenous Stopped 08/07/23 1031)    ED Course/ Medical Decision Making/ A&P                                 Medical Decision Making Amount and/or Complexity of Data Reviewed Labs: ordered.   This patient presents to the ED for concern of weakness, this involves an extensive number of treatment options, and is a complaint that carries with it a high risk of complications and morbidity.  The differential diagnosis includes infection, poor sleep, electrolyte abn   Co morbidities that complicate the patient evaluation  seizures   Additional history obtained:  Additional history obtained from epic chart review External records from outside source obtained and reviewed including EMS report   Lab Tests:  I Ordered, and personally interpreted labs.  The pertinent results include:  cbc with wbc low at 2.7, cmp nl, hcg neg, mg 2.0, ua nl   Cardiac Monitoring:  The patient was maintained on a cardiac monitor.  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  She has not been sleeping well which is likely contributing to issue.  Labs are ok.  She is stable for d/c.  Return if worse.  She is to f/u with her neurologist.    Reevaluation:  After the interventions noted above, I reevaluated the patient and found that they have :improved   Social Determinants of  Health:  Lives at home   Dispostion:  After consideration of the diagnostic results and the patients response to treatment, I feel that the patent would benefit from discharge with outpatient f/u.          Final Clinical Impression(s) / ED Diagnoses  Final diagnoses:  Weakness    Rx / DC Orders ED Discharge Orders     None         Jacalyn Lefevre, MD 08/07/23 1103

## 2023-08-07 NOTE — Discharge Instructions (Addendum)
Continue to take your meds as directed by your doctor.

## 2023-08-17 ENCOUNTER — Encounter (HOSPITAL_COMMUNITY): Payer: Self-pay | Admitting: Emergency Medicine

## 2023-10-04 ENCOUNTER — Other Ambulatory Visit: Payer: Self-pay | Admitting: Family Medicine

## 2024-01-25 ENCOUNTER — Emergency Department (HOSPITAL_COMMUNITY)
Admission: EM | Admit: 2024-01-25 | Discharge: 2024-01-25 | Disposition: A | Discharge status: Home / Self Care | Attending: Emergency Medicine | Admitting: Emergency Medicine

## 2024-01-25 ENCOUNTER — Other Ambulatory Visit: Payer: Self-pay

## 2024-01-25 DIAGNOSIS — N939 Abnormal uterine and vaginal bleeding, unspecified: Secondary | ICD-10-CM | POA: Insufficient documentation

## 2024-01-25 LAB — CBC WITH DIFFERENTIAL/PLATELET
Abs Immature Granulocytes: 0.01 10*3/uL (ref 0.00–0.07)
Basophils Absolute: 0 10*3/uL (ref 0.0–0.1)
Basophils Relative: 1 %
Eosinophils Absolute: 0.1 10*3/uL (ref 0.0–0.5)
Eosinophils Relative: 2 %
HCT: 30.6 % — ABNORMAL LOW (ref 36.0–46.0)
Hemoglobin: 10.1 g/dL — ABNORMAL LOW (ref 12.0–15.0)
Immature Granulocytes: 0 %
Lymphocytes Relative: 41 %
Lymphs Abs: 1.5 10*3/uL (ref 0.7–4.0)
MCH: 29 pg (ref 26.0–34.0)
MCHC: 33 g/dL (ref 30.0–36.0)
MCV: 87.9 fL (ref 80.0–100.0)
Monocytes Absolute: 0.4 10*3/uL (ref 0.1–1.0)
Monocytes Relative: 12 %
Neutro Abs: 1.6 10*3/uL — ABNORMAL LOW (ref 1.7–7.7)
Neutrophils Relative %: 44 %
Platelets: 271 10*3/uL (ref 150–400)
RBC: 3.48 MIL/uL — ABNORMAL LOW (ref 3.87–5.11)
RDW: 12.8 % (ref 11.5–15.5)
WBC: 3.6 10*3/uL — ABNORMAL LOW (ref 4.0–10.5)
nRBC: 0 % (ref 0.0–0.2)

## 2024-01-25 LAB — COMPREHENSIVE METABOLIC PANEL WITH GFR
ALT: 16 U/L (ref 0–44)
AST: 17 U/L (ref 15–41)
Albumin: 3.6 g/dL (ref 3.5–5.0)
Alkaline Phosphatase: 43 U/L (ref 38–126)
Anion gap: 5 (ref 5–15)
BUN: 10 mg/dL (ref 6–20)
CO2: 22 mmol/L (ref 22–32)
Calcium: 8.6 mg/dL — ABNORMAL LOW (ref 8.9–10.3)
Chloride: 112 mmol/L — ABNORMAL HIGH (ref 98–111)
Creatinine, Ser: 0.89 mg/dL (ref 0.44–1.00)
GFR, Estimated: >60 mL/min (ref >60)
Glucose, Bld: 98 mg/dL (ref 70–99)
Potassium: 3.3 mmol/L — ABNORMAL LOW (ref 3.5–5.1)
Sodium: 139 mmol/L (ref 135–145)
Total Bilirubin: 0.3 mg/dL (ref 0.0–1.2)
Total Protein: 6.5 g/dL (ref 6.5–8.1)

## 2024-01-25 LAB — URINALYSIS, ROUTINE W REFLEX MICROSCOPIC
Bacteria, UA: NONE SEEN
Bilirubin Urine: NEGATIVE
Glucose, UA: NEGATIVE mg/dL
Ketones, ur: NEGATIVE mg/dL
Leukocytes,Ua: NEGATIVE
Nitrite: NEGATIVE
Protein, ur: NEGATIVE mg/dL
Specific Gravity, Urine: 1 — ABNORMAL LOW (ref 1.005–1.030)
pH: 7 (ref 5.0–8.0)

## 2024-01-25 LAB — HCG, SERUM, QUALITATIVE: Preg, Serum: NEGATIVE

## 2024-01-25 MED ORDER — MEGESTROL ACETATE 40 MG PO TABS: 40.0000 mg | ORAL_TABLET | Freq: Three times a day (TID) | ORAL | 0 refills | Status: AC

## 2024-01-25 NOTE — ED Provider Notes (Signed)
 [place] [facility] Provider Note   CSN: [id number] Arrival date & time: 01/25/24  1909     History  Chief Complaint  Patient presents with   Vaginal Bleeding    Olivia Kelly is a 38 y.o. female here presenting with vaginal bleeding.  Patient states that her menses started 4 days ago and was light initially.  She states that 3 days ago became very heavy and she was changing pads every 30 minutes.  Patient also has some large clots.  Patient took some Midol  today and felt better.  Patient has no known bleeding disorder.  Patient states that she is not currently on any birth control.  The history is provided by the patient.       Home Medications Prior to Admission medications   Medication Sig Start Date End Date Taking? Authorizing Provider  megestrol (MEGACE) 40 MG tablet Take 1 tablet (40 mg total) by mouth in the morning, at noon, and at bedtime for 5 days. 01/25/24 01/30/24 Yes Dalene Duck, MD  Lacosamide 150 MG TABS Take by mouth. 08/09/21   [provider]  topiramate  (TOPAMAX ) 100 MG tablet Take 150 mg by mouth 2 (two) times daily.    [provider]  traZODone  (DESYREL ) 50 MG tablet Take 1 tablet (50 mg total) by mouth at bedtime. 02/17/23   Corey, Evan S, MD      Allergies    Patient has no known allergies.    Review of Systems   Review of Systems  Genitourinary:  Positive for vaginal bleeding.  All other systems reviewed and are negative.   Physical Exam Updated Vital Signs BP 125/82 (BP Location: Right Arm)   Pulse 74   Temp 98.4 F (36.9 C)   Resp 14   SpO2 100%  Physical Exam Vitals and nursing note reviewed.  Constitutional:      Appearance: Normal appearance.  HENT:     Head: Normocephalic.     Nose: Nose normal.     Mouth/Throat:     Mouth: Mucous membranes are moist.  Eyes:     Extraocular Movements: Extraocular movements intact.     Pupils: Pupils are equal, round, and reactive  to light.  Cardiovascular:     Rate and Rhythm: Normal rate and regular rhythm.     Pulses: Normal pulses.     Heart sounds: Normal heart sounds.  Pulmonary:     Effort: Pulmonary effort is normal.     Breath sounds: Normal breath sounds.  Abdominal:     General: Abdomen is flat.     Palpations: Abdomen is soft.  Genitourinary:    Comments: Deferred Musculoskeletal:        General: Normal range of motion.     Cervical back: Normal range of motion and neck supple.  Skin:    General: Skin is warm.     Capillary Refill: Capillary refill takes less than 2 seconds.  Neurological:     General: No focal deficit present.     Mental Status: She is alert and oriented to person, place, and time.  Psychiatric:        Mood and Affect: Mood normal.        Behavior: Behavior normal.     ED Results / Procedures / Treatments   Labs (all labs ordered are listed, but only abnormal results are displayed) Labs Reviewed  CBC WITH DIFFERENTIAL/PLATELET - Abnormal; Notable for the following components:  Result Value   WBC 3.6 (*)    RBC 3.48 (*)    Hemoglobin 10.1 (*)    HCT 30.6 (*)    Neutro Abs 1.6 (*)    All other components within normal limits  COMPREHENSIVE METABOLIC PANEL WITH GFR - Abnormal; Notable for the following components:   Potassium 3.3 (*)    Chloride 112 (*)    Calcium 8.6 (*)    All other components within normal limits  URINALYSIS, ROUTINE W REFLEX MICROSCOPIC - Abnormal; Notable for the following components:   Color, Urine COLORLESS (*)    Specific Gravity, Urine 1.000 (*)    Hgb urine dipstick MODERATE (*)    All other components within normal limits  HCG, SERUM, QUALITATIVE    EKG None  Radiology No results found.  Procedures Procedures    Medications Ordered in ED Medications - No data to display  ED Course/ Medical Decision Making/ A&P                                 Medical Decision Making Olivia Kelly is a 38 y.o. female here  presenting with vaginal bleeding.  Patient has heavy menstrual bleeding.  No concern for STDs.  Patient's hemoglobin today is 10 down from baseline of 12.  Patient's chemistry is unremarkable and patient is not pregnant.  Will give Megace 40 mg 3 times daily for 5 days to help with bleeding.  Patient has a GYN doctor encouraged her to follow-up with GYN to discuss options    Problems Addressed: Vaginal bleeding: acute illness or injury  Amount and/or Complexity of Data Reviewed Labs: ordered. Decision-making details documented in ED Course.  Risk Prescription drug management.    Final Clinical Impression(s) / ED Diagnoses Final diagnoses:  None    Rx / DC Orders ED Discharge Orders          Ordered    megestrol (MEGACE) 40 MG tablet  3 times daily        01/25/24 2222              Dalene Duck, MD 01/25/24 2235

## 2024-01-25 NOTE — ED Provider Triage Note (Signed)
 Emergency Medicine Provider Triage Evaluation Note  RAYNA BRENNER , a 38 y.o. female  was evaluated in triage.  Pt complains of vaginal bleeding.  Started her menstrual cycle on Monday at its regular scheduled time.  She states that yesterday she started having heavy bleeding to the point that she was changing her pad every 30 minutes.  She notes she was also passing large clots.  Endorses lower pelvic cramping worse than normal.  Review of Systems  Positive:  Negative:   Physical Exam  BP 125/82 (BP Location: Right Arm)   Pulse 74   Temp 98.4 F (36.9 C)   Resp 14   SpO2 100%  Gen:   Awake, no distress   Resp:  Normal effort  MSK:   Moves extremities without difficulty  Other:    Medical Decision Making  Medically screening exam initiated at 7:33 PM.  Appropriate orders placed.  MIKALA PODOLL was informed that the remainder of the evaluation will be completed by another provider, this initial triage assessment does not replace that evaluation, and the importance of remaining in the ED until their evaluation is complete.     Fredna Jasper 01/25/24 1934

## 2024-01-25 NOTE — ED Triage Notes (Signed)
 Patient reports heavy menstrual bleeding onset yesterday with intermittent low abdominal cramping ., no diarrhea or emesis .

## 2024-01-25 NOTE — Discharge Instructions (Signed)
 Taking Megace 40 mg 3 times daily for 5 days to help with the bleeding  As we discussed you should call your GYN doctor to discuss options if you have persistent bleeding every month  Return to ER if you have worse abdominal pain or bleeding or passing out

## 2024-07-22 ENCOUNTER — Telehealth

## 2024-07-22 DIAGNOSIS — N926 Irregular menstruation, unspecified: Secondary | ICD-10-CM | POA: Diagnosis not present

## 2024-07-22 NOTE — Patient Instructions (Signed)
 Olivia Kelly, thank you for joining Olivia CHRISTELLA Dickinson, Olivia Kelly for today's virtual visit.  While this provider is not your primary care provider (PCP), if your PCP is located in our provider database this encounter information will be shared with them immediately following your visit.   A [facility] MyChart account gives you access to today's visit and all your visits, tests, and labs performed at [facility]  click here if you don't have a [facility] MyChart account or go to [email or web address]  Consent: (Patient) Olivia Kelly provided verbal consent for this virtual visit at the beginning of the encounter.  Current Medications:  Current Outpatient Medications:    Lacosamide 150 MG TABS, Take by mouth., Disp: , Rfl:    topiramate  (TOPAMAX ) 100 MG tablet, Take 150 mg by mouth 2 (two) times daily., Disp: , Rfl:    traZODone  (DESYREL ) 50 MG tablet, Take 1 tablet (50 mg total) by mouth at bedtime., Disp: 30 tablet, Rfl: 0   Medications ordered in this encounter:  No orders of the defined types were placed in this encounter.    *If you need refills on other medications prior to your next appointment, please contact your pharmacy*  Follow-Up: Call back or seek an in-person evaluation if the symptoms worsen or if the condition fails to improve as anticipated.  [facility] [phone]  Other Instructions  Abnormal Uterine Bleeding  Abnormal uterine bleeding is unusual bleeding from the uterus. It includes bleeding after sex, or bleeding or spotting between menstrual periods. It may also include bleeding that is heavier than normal, menstrual periods that last longer than usual, or bleeding that occurs after menopause. Abnormal uterine bleeding can affect teenagers, women in their reproductive years, pregnant women, and women who have reached menopause. Common causes of abnormal uterine bleeding include: Pregnancy. Abnormal growths within the  lining of the uterus (polyps). Benign tumors or growths in the uterus (fibroids). These are not cancer. Infection. Cancer. Too much or too little of some hormones in the body (hormonal imbalances). Any type of abnormal bleeding should be checked by a health care provider. Many cases are minor and simple to treat, but others may be more serious. Treatment will depend on the cause of the bleeding and how severe it is. Follow these instructions at home: Medicines Take over-the-counter and prescription medicines only as told by your health care provider. Ask your health care provider about: Taking medicines such as aspirin and ibuprofen . These medicines can thin your blood. Do not take these medicines unless your health care provider tells you to take them. Taking over-the-counter medicines, vitamins, herbs, and supplements. If you were prescribed iron pills, take them as told by your health care provider. Iron pills help to replace iron that your body loses because of this condition. Managing constipation In cases of severe bleeding, you may be asked to increase your iron intake to treat anemia. Doing this may cause constipation. To prevent or treat constipation, you may need to: Drink enough fluid to keep your urine pale yellow. Take over-the-counter or prescription medicines. Eat foods that are high in fiber, such as beans, whole grains, and fresh fruits and vegetables. Limit foods that are high in fat and processed sugars, such as fried or sweet foods. Activity Alter your activity to decrease bleeding if you need to change your sanitary pad more than one time every 2 hours: Lie in bed with your feet raised (elevated). Place a cold pack on your lower abdomen.  Rest as much as possible until the bleeding stops or slows down. General instructions Do not use tampons, douche, or have sex until your health care provider says these things are okay. Change your sanitary pads often. Get regular  exams. These include pelvic exams and cervical cancer screenings. It is up to you to get the results of any tests that are done. Ask your health care provider, or the department that is doing the tests, when your results will be ready. Monitor your condition for any changes. For 2 months, write down: When your menstrual period starts. When your menstrual period ends. When any abnormal vaginal bleeding occurs. What problems you notice. Keep all follow-up visits. This is important. Contact a health care provider if: You have bleeding that lasts for more than one week. You feel dizzy at times. You feel nauseous or you vomit. You feel light-headed or weak. You notice any other changes that show that your condition is getting worse. Get help right away if: You faint. You have bleeding that soaks through a sanitary pad every hour. You have pain in the abdomen. You have a fever or chills. You become sweaty or weak. You pass large blood clots from your vagina. These symptoms may represent a serious problem that is an emergency. Do not wait to see if the symptoms will go away. Get medical help right away. Call your local emergency services (911 in the [place] Do not drive yourself to the hospital. Summary Abnormal uterine bleeding is unusual bleeding from the uterus. Any type of abnormal bleeding should be checked by a health care provider. Many cases are minor and simple to treat, but others may be more serious. Treatment will depend on the cause of the bleeding and how severe it is. Get help right away if you faint, you have bleeding that soaks through a sanitary pad every hour, or you pass large blood clots from your vagina. This information is not intended to replace advice given to you by your health care provider. Make sure you discuss any questions you have with your health care provider. Document Revised: 03/27/2023 Document Reviewed: 12/22/2020 Elsevier Patient Education  2024 Elsevier  Inc.   If you have been instructed to have an in-person evaluation today at a local Urgent Care facility, please use the link below. It will take you to a list of all of our available [facility], including address, phone number and hours of operation. Please do not delay care.  [facility]  If you or a family member do not have a primary care provider, use the link below to schedule a visit and establish care. When you choose a [facility] primary care physician or advanced practice provider, you gain a long-term partner in health. Find a Primary Care Provider  Learn more about [facility]'s in-office and virtual care options: [facility] - Get Care Now

## 2024-07-22 NOTE — Progress Notes (Signed)
 Virtual Visit Consent   Olivia Kelly, you are scheduled for a virtual visit with a [facility] provider today. Just as with appointments in the office, your consent must be obtained to participate. Your consent will be active for this visit and any virtual visit you may have with one of our providers in the next 365 days. If you have a MyChart account, a copy of this consent can be sent to you electronically.  As this is a virtual visit, video technology does not allow for your provider to perform a traditional examination. This may limit your provider's ability to fully assess your condition. If your provider identifies any concerns that need to be evaluated in person or the need to arrange testing (such as labs, EKG, etc.), we will make arrangements to do so. Although advances in technology are sophisticated, we cannot ensure that it will always work on either your end or our end. If the connection with a video visit is poor, the visit may have to be switched to a telephone visit. With either a video or telephone visit, we are not always able to ensure that we have a secure connection.  By engaging in this virtual visit, you consent to the provision of healthcare and authorize for your insurance to be billed (if applicable) for the services provided during this visit. Depending on your insurance coverage, you may receive a charge related to this service.  I need to obtain your verbal consent now. Are you willing to proceed with your visit today? Olivia Kelly has provided verbal consent on 07/22/2024 for a virtual visit (video or telephone). Delon CHRISTELLA Dickinson, PA-C  Date: 07/22/2024 9:40 AM   Virtual Visit via Video Note   I, Delon CHRISTELLA Dickinson, connected with  Olivia Kelly  ([id number], 1986-05-30) on 07/22/24 at  9:30 AM EST by a video-enabled telemedicine application and verified that I am speaking with the correct person using two identifiers.  Location: Patient: Virtual  Visit Location Patient: Home Provider: Virtual Visit Location Provider: Home Office   I discussed the limitations of evaluation and management by telemedicine and the availability of in person appointments. The patient expressed understanding and agreed to proceed.    History of Present Illness: Olivia Kelly is a 38 y.o. who identifies as a female who was assigned female at birth, and is being seen today for abnormal menstrual cycle and stress. Started Thursday, had stress that caused her to have a Seizure (does have history of this and followed by Neurology). Then this caused her menstrual cycle to start 5 days early. Saturday and early Sunday had heavy menstrual bleeding, feels from external stressors, and now back to normal flow. Overall, does feel better and symptoms are improving.   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  Resting comfortably at home.  Head is normocephalic, atraumatic.  No labored breathing.  Speech is clear and coherent with logical content.  Patient is alert and oriented at baseline.    Assessment and Plan: 1. Abnormal menstrual cycle (Primary)  - Improving with rest and hydration - Work note sent  - Seek further evaluation if worsening  Follow Up Instructions: I discussed the assessment and treatment plan with  the patient. The patient was provided an opportunity to ask questions and all were answered. The patient agreed with the plan and demonstrated an understanding of the instructions.  A copy of instructions were sent to the patient via MyChart unless otherwise noted below.    The patient was advised to call back or seek an in-person evaluation if the symptoms worsen or if the condition fails to improve as anticipated.    Delon CHRISTELLA Dickinson, PA-C
